# Patient Record
Sex: Female | Born: 1950
Health system: Southern US, Community
[De-identification: ages and names within clinical notes are randomized; demographics above are authoritative.]

## PROBLEM LIST (undated history)

## (undated) DIAGNOSIS — T300 Burn of unspecified body region, unspecified degree: Secondary | ICD-10-CM

## (undated) DIAGNOSIS — M1990 Unspecified osteoarthritis, unspecified site: Secondary | ICD-10-CM

## (undated) DIAGNOSIS — E785 Hyperlipidemia, unspecified: Secondary | ICD-10-CM

## (undated) DIAGNOSIS — I10 Essential (primary) hypertension: Secondary | ICD-10-CM

## (undated) DIAGNOSIS — T7840XA Allergy, unspecified, initial encounter: Secondary | ICD-10-CM

## (undated) DIAGNOSIS — Z8249 Family history of ischemic heart disease and other diseases of the circulatory system: Secondary | ICD-10-CM

## (undated) DIAGNOSIS — H269 Unspecified cataract: Secondary | ICD-10-CM

## (undated) DIAGNOSIS — C4490 Unspecified malignant neoplasm of skin, unspecified: Secondary | ICD-10-CM

## (undated) DIAGNOSIS — R011 Cardiac murmur, unspecified: Secondary | ICD-10-CM

## (undated) HISTORY — DX: Burn of unspecified body region, unspecified degree: T30.0

## (undated) HISTORY — DX: Hyperlipidemia, unspecified: E78.5

## (undated) HISTORY — DX: Unspecified cataract: H26.9

## (undated) HISTORY — DX: Family history of ischemic heart disease and other diseases of the circulatory system: Z82.49

## (undated) HISTORY — DX: Cardiac murmur, unspecified: R01.1

## (undated) HISTORY — DX: Allergy, unspecified, initial encounter: T78.40XA

## (undated) HISTORY — DX: Essential (primary) hypertension: I10

## (undated) HISTORY — PX: POLYPECTOMY: SHX149

## (undated) HISTORY — DX: Unspecified malignant neoplasm of skin, unspecified: C44.90

## (undated) HISTORY — DX: Unspecified osteoarthritis, unspecified site: M19.90

## (undated) HISTORY — PX: NASAL SINUS SURGERY: SHX719

---

## 2000-03-12 ENCOUNTER — Encounter: Admission: RE | Admit: 2000-03-12 | Discharge: 2000-03-12 | Payer: Self-pay | Admitting: Obstetrics and Gynecology

## 2000-03-12 ENCOUNTER — Encounter: Payer: Self-pay | Admitting: Obstetrics and Gynecology

## 2000-03-13 ENCOUNTER — Other Ambulatory Visit: Admission: RE | Admit: 2000-03-13 | Discharge: 2000-03-13 | Payer: Self-pay | Admitting: Obstetrics and Gynecology

## 2000-03-14 ENCOUNTER — Encounter: Payer: Self-pay | Admitting: Obstetrics and Gynecology

## 2000-03-14 ENCOUNTER — Encounter: Admission: RE | Admit: 2000-03-14 | Discharge: 2000-03-14 | Payer: Self-pay | Admitting: Obstetrics and Gynecology

## 2001-10-29 ENCOUNTER — Encounter: Payer: Self-pay | Admitting: Obstetrics and Gynecology

## 2001-10-29 ENCOUNTER — Encounter: Admission: RE | Admit: 2001-10-29 | Discharge: 2001-10-29 | Payer: Self-pay | Admitting: Obstetrics and Gynecology

## 2003-08-15 ENCOUNTER — Encounter: Admission: RE | Admit: 2003-08-15 | Discharge: 2003-08-15 | Payer: Self-pay | Admitting: Obstetrics and Gynecology

## 2004-11-27 ENCOUNTER — Encounter: Admission: RE | Admit: 2004-11-27 | Discharge: 2004-11-27 | Payer: Self-pay | Admitting: Obstetrics and Gynecology

## 2006-01-16 ENCOUNTER — Encounter: Admission: RE | Admit: 2006-01-16 | Discharge: 2006-01-16 | Payer: Self-pay | Admitting: Obstetrics and Gynecology

## 2007-05-15 ENCOUNTER — Ambulatory Visit: Payer: Self-pay | Admitting: Internal Medicine

## 2007-06-18 ENCOUNTER — Encounter: Admission: RE | Admit: 2007-06-18 | Discharge: 2007-06-18 | Payer: Self-pay | Admitting: Obstetrics and Gynecology

## 2008-04-12 ENCOUNTER — Ambulatory Visit: Payer: Self-pay | Admitting: Internal Medicine

## 2008-05-12 ENCOUNTER — Ambulatory Visit: Payer: Self-pay | Admitting: Internal Medicine

## 2008-08-10 ENCOUNTER — Ambulatory Visit: Payer: Self-pay | Admitting: Internal Medicine

## 2008-08-10 DIAGNOSIS — R5383 Other fatigue: Secondary | ICD-10-CM | POA: Insufficient documentation

## 2008-08-10 DIAGNOSIS — R0989 Other specified symptoms and signs involving the circulatory and respiratory systems: Secondary | ICD-10-CM | POA: Insufficient documentation

## 2008-08-10 DIAGNOSIS — R5381 Other malaise: Secondary | ICD-10-CM

## 2008-08-10 DIAGNOSIS — R0609 Other forms of dyspnea: Secondary | ICD-10-CM

## 2008-08-10 DIAGNOSIS — E785 Hyperlipidemia, unspecified: Secondary | ICD-10-CM | POA: Insufficient documentation

## 2008-08-16 ENCOUNTER — Ambulatory Visit: Payer: Self-pay | Admitting: Internal Medicine

## 2008-08-20 LAB — CONVERTED CEMR LAB
Albumin: 3.9 g/dL (ref 3.5–5.2)
Alkaline Phosphatase: 56 units/L (ref 39–117)
BUN: 15 mg/dL (ref 6–23)
Basophils Absolute: 0.1 10*3/uL (ref 0.0–0.1)
Calcium: 9.7 mg/dL (ref 8.4–10.5)
Eosinophils Absolute: 0.2 10*3/uL (ref 0.0–0.7)
GFR calc non Af Amer: 91.5 mL/min (ref >60)
Glucose, Bld: 99 mg/dL (ref 70–99)
HCT: 42.9 % (ref 36.0–46.0)
Lymphs Abs: 2.4 10*3/uL (ref 0.7–4.0)
Monocytes Relative: 8.6 % (ref 3.0–12.0)
Platelets: 282 10*3/uL (ref 150.0–400.0)
RDW: 12.2 % (ref 11.5–14.6)
T4, Total: 6.9 ug/dL (ref 5.0–12.5)
TSH: 1.34 microintl units/mL (ref 0.35–5.50)

## 2008-08-22 ENCOUNTER — Encounter (INDEPENDENT_AMBULATORY_CARE_PROVIDER_SITE_OTHER): Payer: Self-pay | Admitting: *Deleted

## 2008-08-24 ENCOUNTER — Encounter: Admission: RE | Admit: 2008-08-24 | Discharge: 2008-08-24 | Payer: Self-pay | Admitting: Obstetrics and Gynecology

## 2008-09-16 ENCOUNTER — Ambulatory Visit: Payer: Self-pay | Admitting: Internal Medicine

## 2008-09-16 ENCOUNTER — Telehealth (INDEPENDENT_AMBULATORY_CARE_PROVIDER_SITE_OTHER): Payer: Self-pay

## 2008-09-16 DIAGNOSIS — R740 Nonspecific elevation of levels of transaminase and lactic acid dehydrogenase [LDH]: Secondary | ICD-10-CM

## 2008-09-16 DIAGNOSIS — I10 Essential (primary) hypertension: Secondary | ICD-10-CM | POA: Insufficient documentation

## 2008-09-16 DIAGNOSIS — R7401 Elevation of levels of liver transaminase levels: Secondary | ICD-10-CM | POA: Insufficient documentation

## 2008-09-19 ENCOUNTER — Ambulatory Visit: Payer: Self-pay | Admitting: Internal Medicine

## 2008-09-21 ENCOUNTER — Telehealth (INDEPENDENT_AMBULATORY_CARE_PROVIDER_SITE_OTHER): Payer: Self-pay | Admitting: *Deleted

## 2008-09-21 ENCOUNTER — Encounter (INDEPENDENT_AMBULATORY_CARE_PROVIDER_SITE_OTHER): Payer: Self-pay | Admitting: *Deleted

## 2008-10-19 LAB — CONVERTED CEMR LAB
Alkaline Phosphatase: 83 units/L (ref 39–117)
Bilirubin, Direct: 0.1 mg/dL (ref 0.0–0.3)
Total Bilirubin: 0.7 mg/dL (ref 0.3–1.2)
Total Protein: 7.1 g/dL (ref 6.0–8.3)

## 2010-05-11 ENCOUNTER — Ambulatory Visit
Admission: RE | Admit: 2010-05-11 | Discharge: 2010-05-11 | Payer: Self-pay | Source: Home / Self Care | Attending: Internal Medicine | Admitting: Internal Medicine

## 2010-05-11 DIAGNOSIS — J019 Acute sinusitis, unspecified: Secondary | ICD-10-CM | POA: Insufficient documentation

## 2010-05-13 HISTORY — PX: COLONOSCOPY W/ POLYPECTOMY: SHX1380

## 2010-06-06 ENCOUNTER — Ambulatory Visit
Admission: RE | Admit: 2010-06-06 | Discharge: 2010-06-06 | Payer: Self-pay | Source: Home / Self Care | Attending: Internal Medicine | Admitting: Internal Medicine

## 2010-06-06 DIAGNOSIS — M546 Pain in thoracic spine: Secondary | ICD-10-CM | POA: Insufficient documentation

## 2010-06-06 DIAGNOSIS — R42 Dizziness and giddiness: Secondary | ICD-10-CM | POA: Insufficient documentation

## 2010-06-14 NOTE — Assessment & Plan Note (Signed)
Summary: SINUS CONGESTION & PRESSURE/RH......   Vital Signs:  Patient profile:   60 year old female Weight:      224.8 pounds BMI:     34.05 Temp:     98.0 degrees F oral Pulse rate:   72 / minute Resp:     15 per minute BP sitting:   134 / 90  (left arm) Cuff size:   large  Vitals Entered By: Shonna Chock CMA (May 11, 2010 1:04 PM) CC: Sinus pressure and congestion, onset Monday night/Tuesday morning. , URI symptoms   CC:  Sinus pressure and congestion, onset Monday night/Tuesday morning. , and URI symptoms.  History of Present Illness:      This is a 60 year old woman who presents with URI symptoms; onset 12/26 as sneezing & malaise.  The patient now reports nasal congestion, purulent (green) nasal discharge, and R  earache/ pressure, but denies sore throat and productive cough. The patient denies dyspnea and wheezing.  The patient denies headache.  Risk factors for Strep sinusitis include bilateral facial pain and tooth pain.  The patient denies the following risk factors for Strep sinusitis: Strep exposure and tender adenopathy. Rx: Emergen-C, Vick's, Thera Flu. She has has not had a flu shot.   Allergies (verified): No Known Drug Allergies  Physical Exam  General:  in no acute distress; alert,appropriate and cooperative throughout examination Ears:  L ear normal and R TM erythema.   Nose:  External nasal examination shows no deformity or inflammation. Nasal mucosa are  boggy without lesions or exudates.Hyponasal speech  Mouth:  Oral mucosa and oropharynx without lesions or exudates.  Teeth in good repair. Mild  pharyngeal erythema.   Lungs:  Normal respiratory effort, chest expands symmetrically. Lungs are clear to auscultation, no crackles or wheezes. Heart:  Normal rate and regular rhythm. S1 and S2 normal without gallop, murmur, click, rub or other extra sounds. Cervical Nodes:  No lymphadenopathy noted Axillary Nodes:  No palpable lymphadenopathy   Impression &  Recommendations:  Problem # 1:  SINUSITIS- ACUTE-NOS (ICD-461.9)  Her updated medication list for this problem includes:    Amoxicillin-pot Clavulanate 875-125 Mg Tabs (Amoxicillin-pot clavulanate) .Marland Kitchen... 1 every 12 hrs with a meal    Fluticasone Propionate 50 Mcg/act Susp (Fluticasone propionate) .Marland Kitchen... 1 spray two times a day as needed  Complete Medication List: 1)  Advil or Aleve  .... As needed only 2)  Amoxicillin-pot Clavulanate 875-125 Mg Tabs (Amoxicillin-pot clavulanate) .Marland Kitchen.. 1 every 12 hrs with a meal 3)  Fluticasone Propionate 50 Mcg/act Susp (Fluticasone propionate) .Marland Kitchen.. 1 spray two times a day as needed  Patient Instructions: 1)  Drink as much  NON dairy fluid as you can tolerate for the next few days. Neti pot once daily two times a day as needed until sinuses are clear. Prescriptions: FLUTICASONE PROPIONATE 50 MCG/ACT SUSP (FLUTICASONE PROPIONATE) 1 spray two times a day as needed  #1 x 5   Entered and Authorized by:   Marga Melnick MD   Signed by:   Marga Melnick MD on 05/11/2010   Method used:   Print then Give to Patient   RxID:   [id number] AMOXICILLIN-POT CLAVULANATE 875-125 MG TABS (AMOXICILLIN-POT CLAVULANATE) 1 every 12 hrs with a meal  #20 x 0   Entered and Authorized by:   Marga Melnick MD   Signed by:   Marga Melnick MD on 05/11/2010   Method used:   Print then Give to Patient   RxID:   [id number]  Orders Added: 1)  Est. Patient Level III [81859]
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
## 2010-10-01 ENCOUNTER — Other Ambulatory Visit: Payer: Self-pay | Admitting: Obstetrics

## 2010-10-01 DIAGNOSIS — Z1231 Encounter for screening mammogram for malignant neoplasm of breast: Secondary | ICD-10-CM

## 2010-10-10 ENCOUNTER — Ambulatory Visit (AMBULATORY_SURGERY_CENTER): Payer: PRIVATE HEALTH INSURANCE | Admitting: *Deleted

## 2010-10-10 VITALS — Ht 68.5 in | Wt 228.0 lb

## 2010-10-10 DIAGNOSIS — Z1211 Encounter for screening for malignant neoplasm of colon: Secondary | ICD-10-CM

## 2010-10-10 MED ORDER — PEG-KCL-NACL-NASULF-NA ASC-C 100 G PO SOLR: ORAL | Status: DC

## 2010-10-11 ENCOUNTER — Ambulatory Visit
Admission: RE | Admit: 2010-10-11 | Discharge: 2010-10-11 | Disposition: A | Discharge status: Home / Self Care | Payer: PRIVATE HEALTH INSURANCE | Source: Ambulatory Visit | Attending: Obstetrics | Admitting: Obstetrics

## 2010-10-11 DIAGNOSIS — Z1231 Encounter for screening mammogram for malignant neoplasm of breast: Secondary | ICD-10-CM

## 2010-10-24 ENCOUNTER — Encounter: Payer: Self-pay | Admitting: Internal Medicine

## 2010-10-24 ENCOUNTER — Ambulatory Visit (AMBULATORY_SURGERY_CENTER): Payer: PRIVATE HEALTH INSURANCE | Admitting: Internal Medicine

## 2010-10-24 VITALS — BP 176/91 | HR 78 | Temp 98.7°F | Resp 21 | Ht 68.5 in | Wt 225.0 lb

## 2010-10-24 DIAGNOSIS — Z1211 Encounter for screening for malignant neoplasm of colon: Secondary | ICD-10-CM

## 2010-10-24 DIAGNOSIS — D126 Benign neoplasm of colon, unspecified: Secondary | ICD-10-CM

## 2010-10-24 DIAGNOSIS — K5730 Diverticulosis of large intestine without perforation or abscess without bleeding: Secondary | ICD-10-CM

## 2010-10-24 DIAGNOSIS — Z139 Encounter for screening, unspecified: Secondary | ICD-10-CM

## 2010-10-24 MED ORDER — SODIUM CHLORIDE 0.9 % IV SOLN: 500.0000 mL | INTRAVENOUS | Status: DC

## 2010-10-24 NOTE — Patient Instructions (Signed)
DISCHARGE INSTRUCTIONS GIVEN ( SEE BLUE & GREEN SHEETS)  INFORMATION ON POLYPS ,DIVERTICULOSIS, & HIGH FIBER DIET GIVEN

## 2010-10-25 ENCOUNTER — Telehealth: Payer: Self-pay | Admitting: *Deleted

## 2010-10-25 NOTE — Telephone Encounter (Signed)

## 2010-10-29 ENCOUNTER — Encounter: Payer: Self-pay | Admitting: Internal Medicine

## 2011-01-08 ENCOUNTER — Encounter: Payer: Self-pay | Admitting: Internal Medicine

## 2011-03-19 ENCOUNTER — Ambulatory Visit (INDEPENDENT_AMBULATORY_CARE_PROVIDER_SITE_OTHER): Payer: PRIVATE HEALTH INSURANCE | Admitting: Internal Medicine

## 2011-03-19 ENCOUNTER — Encounter: Payer: Self-pay | Admitting: Internal Medicine

## 2011-03-19 VITALS — BP 138/90 | HR 78 | Temp 98.5°F | Resp 14 | Ht 69.0 in | Wt 235.2 lb

## 2011-03-19 DIAGNOSIS — IMO0002 Reserved for concepts with insufficient information to code with codable children: Secondary | ICD-10-CM

## 2011-03-19 DIAGNOSIS — Z8249 Family history of ischemic heart disease and other diseases of the circulatory system: Secondary | ICD-10-CM

## 2011-03-19 DIAGNOSIS — M179 Osteoarthritis of knee, unspecified: Secondary | ICD-10-CM

## 2011-03-19 DIAGNOSIS — I10 Essential (primary) hypertension: Secondary | ICD-10-CM

## 2011-03-19 DIAGNOSIS — M1710 Unilateral primary osteoarthritis, unspecified knee: Secondary | ICD-10-CM

## 2011-03-19 DIAGNOSIS — Z0000 Encounter for general adult medical examination without abnormal findings: Secondary | ICD-10-CM

## 2011-03-19 DIAGNOSIS — E785 Hyperlipidemia, unspecified: Secondary | ICD-10-CM

## 2011-03-19 DIAGNOSIS — T7840XA Allergy, unspecified, initial encounter: Secondary | ICD-10-CM

## 2011-03-19 DIAGNOSIS — Z91013 Allergy to seafood: Secondary | ICD-10-CM

## 2011-03-19 NOTE — Progress Notes (Signed)
Subjective:    Patient ID: Dawn [name], female    DOB: [birth year], 60 y.o.   MRN: [id number]  HPI  Dawn [name]  is here for a physical;acute issues include DJD of knees & ? Shell fish allergy.      Review of Systems  Knee pain: Onset:years ago Trigger/injury:no Pain quality:aching Pain severity:up to 7 Duration:hours Radiation: no Exacerbating factors: standing for periods Treatment/response:Aleve as needed Review of systems: Constitutional: no fever, chills, sweats, change in weight  Musculoskeletal:intermittent   muscle cramps or pain;   joint stiffness w/o redness but  swelling Skin:no rash, color change Neuro: no weakness; incontinence (stool/urine); numbness and tingling Heme:no lymphadenopathy; abnormal bruising or bleeding    She questions a shellfish allergy based on swelling of ankles after eating shrimp . Additionally she had swelling at the base of the neck without angioedema of face or tongue  after ingesting voices one occasion.   Objective:   Physical Exam Gen.: Healthy and well-nourished in appearance. Alert, appropriate and cooperative throughout exam. Head: Normocephalic without obvious abnormalities  Eyes: No corneal or conjunctival inflammation noted. Pupils equal round reactive to light and accommodation. Fundal exam is benign without hemorrhages, exudate, papilledema. Extraocular motion intact. Vision grossly normal. Ears: External  ear exam reveals no significant lesions or deformities. Canals clear .TMs normal. Hearing is grossly normal bilaterally. Nose: External nasal exam reveals no deformity or inflammation. Nasal mucosa are pink and moist. No lesions or exudates noted. Septum deviated  slightly to R  Mouth: Oral mucosa and oropharynx reveal no lesions or exudates. Teeth in good repair. Neck: No deformities, masses, or tenderness noted. Range of motion &. Thyroid normal. Lungs: Normal respiratory effort; chest expands symmetrically. Lungs are clear to  auscultation without rales, wheezes, or increased work of breathing. Heart: Normal rate and rhythm. Normal S1 and S2. No gallop, click, or rub. S4 w/o  murmur. Abdomen: Bowel sounds normal; abdomen soft and nontender. No masses, organomegaly or hernias noted. Genitalia: as per Gyn   .                                                                                   Musculoskeletal/extremities: No deformity or scoliosis noted of  the thoracic or lumbar spine. No clubbing, cyanosis, edema, or deformity noted. Range of motion decreased @ knees.Tone & strength  normal.Joints normal. Nail health  good. Vascular: Carotid, radial artery, dorsalis pedis and  posterior tibial pulses are full and equal. No bruits present. Neurologic: Alert and oriented x3. Deep tendon reflexes symmetrical and normal.          Skin: Intact without suspicious lesions or rashes. Lymph: No cervical, axillary  lymphadenopathy present. Psych: Mood and affect are normal. Normally interactive  Assessment & Plan:  #1 comprehensive physical exam; no acute findings #2 see Problem List with Assessments & Recommendations  #3 possible shellfish allergy, atypical presentation  #4 degenerative joint disease of knees  Plan: see Orders   Note: EKG reveals minor nonspecific ST-T wave changes. These are stable to improved compared to 08/10/08.

## 2011-03-19 NOTE — Patient Instructions (Signed)
Preventive Health Care: Exercise  30-45  minutes a day, 3-4 days a week. Walking is especially valuable in preventing Osteoporosis. Eat a low-fat diet with lots of fruits and vegetables, up to 7-9 servings per day. Consume less than 30 grams of sugar per day from foods & drinks with High Fructose Corn Syrup as # 1,2,3 or #4 on label. Health Care Power of Attorney & Living Will place you in charge of your health care  decisions. Verify these are  in place. Please  schedule fasting Labs : BMET,Lipids, hepatic panel, CBC & dif, TSH.  Please bring these instructions to that Lab appt.

## 2011-04-25 ENCOUNTER — Encounter: Payer: Self-pay | Admitting: Internal Medicine

## 2011-04-25 ENCOUNTER — Other Ambulatory Visit: Payer: PRIVATE HEALTH INSURANCE

## 2011-04-25 ENCOUNTER — Ambulatory Visit (INDEPENDENT_AMBULATORY_CARE_PROVIDER_SITE_OTHER): Payer: PRIVATE HEALTH INSURANCE | Admitting: Internal Medicine

## 2011-04-25 VITALS — BP 122/80 | HR 79 | Ht 68.5 in | Wt 236.2 lb

## 2011-04-25 DIAGNOSIS — L272 Dermatitis due to ingested food: Secondary | ICD-10-CM

## 2011-04-25 DIAGNOSIS — J309 Allergic rhinitis, unspecified: Secondary | ICD-10-CM

## 2011-04-25 DIAGNOSIS — Z91018 Allergy to other foods: Secondary | ICD-10-CM

## 2011-04-25 DIAGNOSIS — J302 Other seasonal allergic rhinitis: Secondary | ICD-10-CM

## 2011-04-25 DIAGNOSIS — Z91013 Allergy to seafood: Secondary | ICD-10-CM

## 2011-04-25 DIAGNOSIS — T7840XA Allergy, unspecified, initial encounter: Secondary | ICD-10-CM

## 2011-04-25 NOTE — Patient Instructions (Signed)
Order- lab-  Food Allergy profile IgE, not IgG                     Seafood Allergy Profile   Consider pretreating before potential exposure, with a nonsedating otc antihistamine like Claritin/ loratadine or Allegra/ fexofenadine  Avoid those foods that you can clearly associate with symptoms
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
## 2011-04-26 LAB — ALLERGEN FOOD PROFILE SPECIFIC IGE
Fish Cod: <0.1 kU/L (ref <0.35)
IgE (Immunoglobulin E), Serum: 8.7 IU/mL (ref 0.0–180.0)
Milk IgE: <0.1 kU/L (ref <0.35)
Peanut IgE: <0.1 kU/L (ref <0.35)
Shrimp IgE: <0.1 kU/L (ref <0.35)
Soybean IgE: <0.1 kU/L (ref <0.35)
Tuna IgE: <0.1 kU/L (ref <0.35)
Wheat IgE: <0.1 kU/L (ref <0.35)

## 2011-04-28 DIAGNOSIS — Z91013 Allergy to seafood: Secondary | ICD-10-CM | POA: Insufficient documentation

## 2011-04-28 DIAGNOSIS — J302 Other seasonal allergic rhinitis: Secondary | ICD-10-CM | POA: Insufficient documentation

## 2011-04-28 NOTE — Assessment & Plan Note (Signed)
She describes as congestion and sneezing and fall season, probably more so when she was younger. Plan-OTC antihistamine if needed.

## 2011-04-28 NOTE — Assessment & Plan Note (Signed)
The first episode she described involved only peripheral edema after prolonged sitting in hot weather. I think that was not likely an allergic event. The second episode sounds like urticaria. An IgE food allergy mechanism is characteristically repetitive upon repeat exposure, and expected to happen within 2 hours or so of eating. A none-IgE mechanism might have triggered hives a day after her meal, but as a single event the connection is unclear. I explained to her the difficulties of testing for food allergy. We will send a food allergy profile.

## 2011-05-13 ENCOUNTER — Telehealth: Payer: Self-pay | Admitting: Internal Medicine

## 2011-05-13 NOTE — Telephone Encounter (Signed)
Pt is aware that results have not come back as of yet-still being worked on through [facility]. I will call with results as soon as CY has reviewed. Pt understands and is okay.

## 2011-06-04 ENCOUNTER — Telehealth: Payer: Self-pay | Admitting: Internal Medicine

## 2011-06-04 NOTE — Telephone Encounter (Signed)
I spoke with patient about results and she verbalized understanding and had no questions 

## 2011-06-04 NOTE — Telephone Encounter (Signed)
I spoke with pt and she is requesting her allergy profile results from december. According to last phone note 05/13/11 [facility] was still working on this. Please advise Dr. Maple Hudson if you have received these results yet, thanks

## 2011-06-04 NOTE — Telephone Encounter (Signed)
My apologies- I don' know why this wasn't called at the time. Her food allergy profile showed no elevation of allergy antibodies for any foods tested. I can see her back as scheduled/ needed. She is advised to avoid foods that she finds she doesn't tolerate.

## 2011-07-31 ENCOUNTER — Telehealth: Payer: Self-pay | Admitting: Internal Medicine

## 2011-07-31 ENCOUNTER — Encounter: Payer: Self-pay | Admitting: Internal Medicine

## 2011-07-31 ENCOUNTER — Ambulatory Visit (INDEPENDENT_AMBULATORY_CARE_PROVIDER_SITE_OTHER): Payer: PRIVATE HEALTH INSURANCE | Admitting: Internal Medicine

## 2011-07-31 VITALS — BP 134/90 | HR 89 | Temp 98.5°F | Wt 233.8 lb

## 2011-07-31 DIAGNOSIS — Z1289 Encounter for screening for malignant neoplasm of other sites: Secondary | ICD-10-CM

## 2011-07-31 DIAGNOSIS — R1031 Right lower quadrant pain: Secondary | ICD-10-CM

## 2011-07-31 LAB — POCT URINALYSIS DIPSTICK
Bilirubin, UA: NEGATIVE
Blood, UA: NEGATIVE
Nitrite, UA: NEGATIVE
pH, UA: 7.5

## 2011-07-31 NOTE — Telephone Encounter (Signed)
Left message to call office

## 2011-07-31 NOTE — Telephone Encounter (Signed)
Discuss with patient  

## 2011-07-31 NOTE — Progress Notes (Signed)
  Subjective:    Patient ID: Dawn [name], female    DOB: [birth year], 61 y.o.   MRN: [id number]  HPI ABDOMINAL PAIN: Location: RLQ Onset: 10 days ago   Radiation: no  Severity: up to 8 today Quality: cramping Duration:up to 1 hr  Better with: no relievers Worse with: no factors Symptoms Nausea/Vomiting: no Diarrhea: no  Constipation: no Melena/BRBPR: no  Hematemesis: no Anorexia: no Fever/Chills: no Dysuria/ hematuria/pyuria: no but ? Traces of blood in underwear today Rash: no Wt loss: no  Vaginal bleeding:no but see above Past Surgeries: Her last colonoscopy was in July/2012. She has had polyps in the past. Followup is due in 2017.  There is no personal or family history of gastritis, ulcer, colitis, or colon cancer.       Review of Systems She denies any bloating; she had negative gynecologic exam in the summer of 2012. No PAP was done because of her age.     Objective:   Physical Exam Gen.: well-nourished in appearance. Alert, appropriate and cooperative throughout exam.  Eyes: No corneal or conjunctival inflammation noted. No icterus.  Nose: External nasal exam reveals no deformity or inflammation. Nasal mucosa are pink and moist. No lesions or exudates noted.  Mouth: Oral mucosa and oropharynx reveal no lesions or exudates. Minimal oropharyngeal erythema. Teeth in good repair. Neck: No deformities, masses, or tenderness noted.  Lungs: Normal respiratory effort; chest expands symmetrically. Lungs are clear to auscultation without rales, wheezes, or increased work of breathing. Heart: Normal rate and rhythm. Normal S1 and S2. No gallop, click, or rub. Grade 12- 1 over /6 systolic murmur . Abdomen: Bowel sounds normal; abdomen soft  But tender RLQ. No masses, organomegaly or hernias noted.  Musculoskeletal/extremities: No deformity or scoliosis noted of  the thoracic or lumbar spine. No clubbing, cyanosis, edema, or deformity noted. Nail health  good. Vascular:  Carotid, radial artery, dorsalis pedis and  posterior tibial pulses are full and equal. No bruits present. Neurologic: Alert and oriented x3.   Skin: Intact without suspicious lesions or rashes. No jaundice Lymph: No cervical, axillary lymphadenopathy present. Psych: Mood and affect are normal. Normally interactive                                                                                         Assessment & Plan:   #1 intermittent right lower quadrant pain over the last 10 days without significant gynecologic or genitourinary symptoms. Rule out low-grade appendicitis versus ovarian cyst.  Plan: See orders and recommendations

## 2011-07-31 NOTE — Patient Instructions (Signed)
Stay on clear liquids for 48-72 hours or until symptoms resolve.This would include  jello, sherbert (NOT ice cream), Lipton's chicken noodle soup(NOT cream based soups),Gatorade Lite, flat Ginger ale (without High Fructose Corn Syrup),dry toast or crackers, baked potato.No milk , dairy or grease until asymptomatic.  Report increasing pain, fever or rectal bleeding . Please complete stool cards

## 2011-07-31 NOTE — Telephone Encounter (Signed)
To ER if the pain recurs , persists without resolution or if having high fever or rectal bleeding. If none of these present keep OV today

## 2011-07-31 NOTE — Telephone Encounter (Signed)
Caller: Dawn [name]/Patient; PCP: Marga Melnick; CB#: [phone]; ; ; Call regarding R. Sided Lower Abdominal Pain X. 1. Week,; states on arising AM  07/31/11 states she was unable to stand up straight.  Pain resolved within 30 min and is currently a dull ache.  Pain has not moved during .  Denies constipation or urinary symptoms.  Afebrile.  Noted some spotting on wiping AM 07/31/11.  Per protocol, advised appt within 4 hours; appt sched first available 07/31/11 1400 with Dr. Alwyn Ren.

## 2011-08-01 LAB — CBC WITH DIFFERENTIAL/PLATELET
Basophils Absolute: 0 10*3/uL (ref 0.0–0.1)
Eosinophils Absolute: 0.1 10*3/uL (ref 0.0–0.7)
HCT: 43.9 % (ref 36.0–46.0)
Hemoglobin: 14.8 g/dL (ref 12.0–15.0)
Lymphs Abs: 3 10*3/uL (ref 0.7–4.0)
MCHC: 33.8 g/dL (ref 30.0–36.0)
MCV: 96.8 fl (ref 78.0–100.0)
Monocytes Absolute: 0.6 10*3/uL (ref 0.1–1.0)
Monocytes Relative: 11.9 % (ref 3.0–12.0)
Neutro Abs: 1.3 10*3/uL — ABNORMAL LOW (ref 1.4–7.7)
RDW: 12.9 % (ref 11.5–14.6)

## 2011-08-02 ENCOUNTER — Ambulatory Visit
Admission: RE | Admit: 2011-08-02 | Discharge: 2011-08-02 | Disposition: A | Discharge status: Home / Self Care | Payer: PRIVATE HEALTH INSURANCE | Source: Ambulatory Visit | Attending: Internal Medicine | Admitting: Internal Medicine

## 2011-08-02 DIAGNOSIS — R1031 Right lower quadrant pain: Secondary | ICD-10-CM

## 2011-08-02 MED ORDER — IOHEXOL 300 MG/ML  SOLN
100.0000 mL | Freq: Once | INTRAMUSCULAR | Status: AC | PRN
  Administered 2011-08-02: 100 mL via INTRAVENOUS

## 2011-08-02 NOTE — Progress Notes (Signed)
Addended by: Maurice Small on: 08/02/2011 10:40 AM   Modules accepted: Orders

## 2011-08-06 NOTE — Progress Notes (Signed)
Addended by: Silvio Pate D on: 08/06/2011 10:14 AM   Modules accepted: Orders

## 2012-01-07 ENCOUNTER — Other Ambulatory Visit: Payer: Self-pay | Admitting: Obstetrics

## 2012-01-07 DIAGNOSIS — Z1231 Encounter for screening mammogram for malignant neoplasm of breast: Secondary | ICD-10-CM

## 2012-01-30 ENCOUNTER — Ambulatory Visit: Payer: PRIVATE HEALTH INSURANCE

## 2012-05-26 ENCOUNTER — Ambulatory Visit (INDEPENDENT_AMBULATORY_CARE_PROVIDER_SITE_OTHER): Payer: PRIVATE HEALTH INSURANCE | Admitting: Internal Medicine

## 2012-05-26 ENCOUNTER — Encounter: Payer: Self-pay | Admitting: Internal Medicine

## 2012-05-26 VITALS — BP 122/86 | HR 87 | Temp 98.8°F | Wt 229.6 lb

## 2012-05-26 DIAGNOSIS — Z20828 Contact with and (suspected) exposure to other viral communicable diseases: Secondary | ICD-10-CM

## 2012-05-26 DIAGNOSIS — R059 Cough, unspecified: Secondary | ICD-10-CM

## 2012-05-26 DIAGNOSIS — R05 Cough: Secondary | ICD-10-CM

## 2012-05-26 LAB — POCT INFLUENZA A/B: Influenza B, POC: NEGATIVE

## 2012-05-26 MED ORDER — FLUTICASONE-SALMETEROL 250-50 MCG/DOSE IN AEPB: 1.0000 | INHALATION_SPRAY | Freq: Two times a day (BID) | RESPIRATORY_TRACT | Status: DC

## 2012-05-26 MED ORDER — OSELTAMIVIR PHOSPHATE 75 MG PO CAPS: 75.0000 mg | ORAL_CAPSULE | Freq: Two times a day (BID) | ORAL | Status: DC

## 2012-05-26 MED ORDER — PREDNISONE 20 MG PO TABS: 20.0000 mg | ORAL_TABLET | Freq: Two times a day (BID) | ORAL | Status: DC

## 2012-05-26 NOTE — Patient Instructions (Addendum)
Advair one  inhalation every 12 hours; gargle and spit after use 

## 2012-05-26 NOTE — Progress Notes (Signed)
  Subjective:    Patient ID: Dawn [name], female    DOB: [birth year], 62 y.o.   MRN: [id number]  HPI The respiratory tract symptoms began 05/25/12  as chest congestion with dry cough to point of vomitus Significant associated symptoms include R earache w/o otic discharge   Fever low grade with chills  present  1/12  Cough was associated with  shortness of breath and wheezing .       Treatment with  ColdEase, Airborne, Neti pot, NSAIDS, & Alka Seltzer was partially effective  There is no history of asthma. The patient had  quit smoking in 2013          Review of Systems Symptoms not present include frontal headache, facial pain, dental pain, sore throat,or nasal purulence. Itchy , watery eyes or significant sneezing were not noted.    Objective:   Physical Exam General appearance:good health ;well nourished; no acute distress or increased work of breathing is present.  No  lymphadenopathy about the head, neck, or axilla noted.  Eyes: No conjunctival inflammation or lid edema is present. There is no scleral icterus. Ears:  External ear exam shows no significant lesions or deformities.  Otoscopic examination reveals clear canals, tympanic membranes are intact bilaterally without bulging, retraction, inflammation or discharge. Nose:  External nasal examination shows no deformity or inflammation. Nasal mucosa are pink and moist without lesions or exudates. No septal dislocation or deviation.No obstruction to airflow.  Oral exam: Dental hygiene is good; lips and gums are healthy appearing.There is no oropharyngeal erythema or exudate noted.  Neck:  No deformities,  masses, or tenderness noted.   Supple with full range of motion without pain.  Heart:  Normal rate and regular rhythm. S1 and S2 normal without gallop, murmur, click, rub or other extra sounds.  Lungs:mild scattered  Rhonchi & rales . No rubs present.No increased work of breathing.   Extremities:  No cyanosis, edema,  or clubbing  noted  Skin: Warm & dry w/o jaundice or tenting.          Assessment & Plan:   #1 RTI , flu like syndrome with mild bronchospasm Plan: See orders and recommendations

## 2012-05-27 ENCOUNTER — Telehealth: Payer: Self-pay | Admitting: *Deleted

## 2012-05-27 MED ORDER — HYDROCODONE-HOMATROPINE 5-1.5 MG/5ML PO SYRP: ORAL_SOLUTION | ORAL | Status: DC

## 2012-05-27 NOTE — Telephone Encounter (Signed)
Pt would like to get a Rx for a cough med for her and her husband. .Please advise

## 2012-05-27 NOTE — Telephone Encounter (Signed)
Generic Hydromet Rx written 1/14; refill in her name if lost

## 2012-05-27 NOTE — Telephone Encounter (Signed)
Discuss with patient, Rx sent. 

## 2012-06-27 ENCOUNTER — Other Ambulatory Visit: Payer: Self-pay

## 2012-06-30 ENCOUNTER — Ambulatory Visit: Payer: PRIVATE HEALTH INSURANCE

## 2012-07-06 ENCOUNTER — Ambulatory Visit
Admission: RE | Admit: 2012-07-06 | Discharge: 2012-07-06 | Disposition: A | Discharge status: Home / Self Care | Payer: PRIVATE HEALTH INSURANCE | Source: Ambulatory Visit | Attending: Obstetrics | Admitting: Obstetrics

## 2012-07-06 DIAGNOSIS — Z1231 Encounter for screening mammogram for malignant neoplasm of breast: Secondary | ICD-10-CM

## 2012-08-19 ENCOUNTER — Telehealth: Payer: Self-pay | Admitting: Internal Medicine

## 2012-08-19 MED ORDER — CYCLOBENZAPRINE HCL 5 MG PO TABS: ORAL_TABLET | ORAL | Status: DC

## 2012-08-19 NOTE — Telephone Encounter (Signed)
Per Dr.Hopper Flexeril 5 mg 1-2 by mouth qhs #14, office visit if no better   Patient's husband aware rx sent in and appointment necessary if no better.

## 2012-08-19 NOTE — Telephone Encounter (Signed)
PT husband came in and wanted to know if dr hopper would write a rx for muscle spasms for her and send it to [facility] on  [address]

## 2012-08-19 NOTE — Telephone Encounter (Signed)
Dr.Hopper please advise 

## 2013-03-18 ENCOUNTER — Other Ambulatory Visit: Payer: Self-pay

## 2013-05-13 DIAGNOSIS — C4490 Unspecified malignant neoplasm of skin, unspecified: Secondary | ICD-10-CM

## 2013-05-13 HISTORY — DX: Unspecified malignant neoplasm of skin, unspecified: C44.90

## 2013-10-25 ENCOUNTER — Other Ambulatory Visit: Payer: Self-pay

## 2013-10-25 DIAGNOSIS — Z1231 Encounter for screening mammogram for malignant neoplasm of breast: Secondary | ICD-10-CM

## 2013-11-02 ENCOUNTER — Ambulatory Visit
Admission: RE | Admit: 2013-11-02 | Discharge: 2013-11-02 | Disposition: A | Discharge status: Home / Self Care | Payer: PRIVATE HEALTH INSURANCE | Source: Ambulatory Visit

## 2013-11-02 DIAGNOSIS — Z1231 Encounter for screening mammogram for malignant neoplasm of breast: Secondary | ICD-10-CM

## 2014-12-05 ENCOUNTER — Encounter: Payer: Self-pay | Admitting: Internal Medicine

## 2014-12-05 ENCOUNTER — Ambulatory Visit (INDEPENDENT_AMBULATORY_CARE_PROVIDER_SITE_OTHER): Payer: PRIVATE HEALTH INSURANCE | Admitting: Internal Medicine

## 2014-12-05 VITALS — BP 142/84 | HR 80 | Temp 97.8°F | Resp 16 | Ht 68.5 in | Wt 236.0 lb

## 2014-12-05 DIAGNOSIS — Z8489 Family history of other specified conditions: Secondary | ICD-10-CM | POA: Diagnosis not present

## 2014-12-05 DIAGNOSIS — C4490 Unspecified malignant neoplasm of skin, unspecified: Secondary | ICD-10-CM | POA: Insufficient documentation

## 2014-12-05 DIAGNOSIS — Z0000 Encounter for general adult medical examination without abnormal findings: Secondary | ICD-10-CM

## 2014-12-05 DIAGNOSIS — Z87898 Personal history of other specified conditions: Secondary | ICD-10-CM

## 2014-12-05 NOTE — Progress Notes (Signed)
Pre visit review using our clinic review tool, if applicable. No additional management support is needed unless otherwise documented below in the visit note. 

## 2014-12-05 NOTE — Patient Instructions (Addendum)
Sleep apnea may present as a disturbed sleep pattern, significant fatigue, heart rhythm disturbance,or edema ( swelling of the extremities). Please let me know if I may refer you for sleep apnea evaluation if snoring is significant.   Your next office appointment will be determined based upon review of your pending labs .  Those written interpretation of the lab results and instructions will be transmitted to you by My Chart    Critical results will be called.   Followup as needed for any active or acute issue. Please report any significant change in your symptoms.  Minimal Blood Pressure Goal= AVERAGE < 140/90;  Ideal is an AVERAGE < 135/85. This AVERAGE should be calculated from @ least 5-7 BP readings taken @ different times of day on different days of week. You should not respond to isolated BP readings , but rather the AVERAGE for that week .Please bring your  blood pressure cuff to office visits to verify that it is reliable.It  can also be checked against the blood pressure device at the pharmacy. Finger or wrist cuffs are not dependable; an arm cuff is.  Colonoscopy due 2017

## 2014-12-05 NOTE — Progress Notes (Signed)
   Subjective:    Patient ID: Dawn [name], female    DOB: 06-Mar-1951, 64 y.o.   MRN: [id number]  HPI She is here for a physical;acute issues denied.  She eats red meat occasionally but is essentially on a heart healthy diet. She has decreased salt intake. She is not monitoring her blood pressure except at the gym. It was 142/92 at the last reading. She uses a stationary bike 2 times a week for 20 minutes without cardiopulmonary symptoms.  Colonoscopy is due 2017 ;she denies any active GI symptoms.  Review of Systems  She does have some heat intolerance. She also has nocturia 1 on average. Her husband states she snores but does not mention any apnea.  Chest pain, palpitations, tachycardia, exertional dyspnea, paroxysmal nocturnal dyspnea, claudication or edema are absent. No unexplained weight loss, abdominal pain, significant dyspepsia, dysphagia, melena, rectal bleeding, or persistently small caliber stools. Dysuria, pyuria, hematuria, frequency, or polyuria are denied. Change in hair, skin, nails denied. No bowel changes of constipation or diarrhea. No intolerance to cold.      Objective:   Physical Exam  Pertinent or positive findings include: The oropharynx is crowded with poor visualization. She has slight crepitus of the knees. General appearance :adequately nourished; in no distress.BMI 35.36.  Eyes: No conjunctival inflammation or scleral icterus is present.  Oral exam:  Lips and gums are healthy appearing.There is no oropharyngeal erythema or exudate noted. Dental hygiene is good.  Heart:  Normal rate and regular rhythm. S1 and S2 normal without gallop, murmur, click, rub or other extra sounds    Lungs:Chest clear to auscultation; no wheezes, rhonchi,rales ,or rubs present.No increased work of breathing.   Abdomen: bowel sounds normal, soft and non-tender without masses, organomegaly or hernias noted.  No guarding or rebound.   Vascular : all pulses equal ; no  bruits present.  Skin:Warm & dry.  Intact without suspicious lesions or rashes ; no tenting or jaundice   Lymphatic: No lymphadenopathy is noted about the head, neck, axilla   Neuro: Strength, tone & DTRs normal.        Assessment & Plan:  #1 comprehensive physical exam; no acute findings  #2 snoring with decreased diameter of the oropharynx. She's been asked to discuss the degree of snoring her husband has noted. Sleep apnea evaluation should be considered if significant.  Plan: see Orders  & Recommendations

## 2015-02-09 ENCOUNTER — Other Ambulatory Visit: Payer: Self-pay

## 2015-02-09 DIAGNOSIS — Z1231 Encounter for screening mammogram for malignant neoplasm of breast: Secondary | ICD-10-CM

## 2015-03-01 ENCOUNTER — Ambulatory Visit
Admission: RE | Admit: 2015-03-01 | Discharge: 2015-03-01 | Disposition: A | Discharge status: Home / Self Care | Payer: PRIVATE HEALTH INSURANCE | Source: Ambulatory Visit

## 2015-03-01 ENCOUNTER — Ambulatory Visit: Payer: PRIVATE HEALTH INSURANCE

## 2015-03-01 DIAGNOSIS — Z1231 Encounter for screening mammogram for malignant neoplasm of breast: Secondary | ICD-10-CM

## 2015-09-18 ENCOUNTER — Encounter: Payer: Self-pay | Admitting: Gastroenterology

## 2016-02-26 DIAGNOSIS — M170 Bilateral primary osteoarthritis of knee: Secondary | ICD-10-CM | POA: Diagnosis not present

## 2016-02-26 DIAGNOSIS — M25561 Pain in right knee: Secondary | ICD-10-CM | POA: Diagnosis not present

## 2016-03-26 ENCOUNTER — Ambulatory Visit: Payer: PRIVATE HEALTH INSURANCE | Admitting: Nurse Practitioner

## 2016-04-22 ENCOUNTER — Ambulatory Visit (INDEPENDENT_AMBULATORY_CARE_PROVIDER_SITE_OTHER): Payer: Medicare Other | Admitting: Nurse Practitioner

## 2016-04-22 ENCOUNTER — Other Ambulatory Visit: Payer: Self-pay | Admitting: Family

## 2016-04-22 ENCOUNTER — Encounter: Payer: Self-pay | Admitting: Nurse Practitioner

## 2016-04-22 VITALS — BP 138/84 | HR 101 | Temp 98.1°F | Ht 68.5 in | Wt 245.0 lb

## 2016-04-22 DIAGNOSIS — Z23 Encounter for immunization: Secondary | ICD-10-CM | POA: Diagnosis not present

## 2016-04-22 DIAGNOSIS — T2125XA Burn of second degree of buttock, initial encounter: Secondary | ICD-10-CM | POA: Diagnosis not present

## 2016-04-22 DIAGNOSIS — L03317 Cellulitis of buttock: Secondary | ICD-10-CM | POA: Diagnosis not present

## 2016-04-22 DIAGNOSIS — Z1211 Encounter for screening for malignant neoplasm of colon: Secondary | ICD-10-CM

## 2016-04-22 MED ORDER — CLINDAMYCIN HCL 300 MG PO CAPS: 300.0000 mg | ORAL_CAPSULE | Freq: Three times a day (TID) | ORAL | 0 refills | Status: DC

## 2016-04-22 NOTE — Patient Instructions (Addendum)
Applied extra virgin olive oil or coconut oil twice a day to scab.  Keep wound clean.  Debride wound scab if no improvement with oral abx.

## 2016-04-22 NOTE — Progress Notes (Signed)
Subjective:  Patient ID: Dawn [name], female    DOB: August 26, 1950  Age: 65 y.o. MRN: [id number]  CC: Establish Care (establish care/transfer from [facility]/place on back (burn herself with heating pad last week,not healing.flu shot? )  Wound Check  She was originally treated 5 to 10 days ago (right buttocks after sleeping on heating pad). Previous treatment included wound cleansing or irrigation (and neosporin). Her temperature was unmeasured prior to arrival. There has been no drainage from the wound. The redness has not changed. There is no swelling present. There is no pain present. She has no difficulty moving the affected extremity or digit.    Outpatient Medications Prior to Visit  Medication Sig Dispense Refill  . Biotin 300 MCG TABS Take by mouth daily.    . Multiple Vitamins-Minerals (MULTIVITAMIN PO) Take by mouth daily.    . Naproxen Sodium (ALEVE PO) Take by mouth as needed.     No facility-administered medications prior to visit.     ROS See HPI  Objective:  BP 138/84   Pulse (!) 101   Temp 98.1 F (36.7 C)   Ht 5' 8.5" (1.74 m)   Wt 245 lb (111.1 kg)   SpO2 98%   BMI 36.71 kg/m   BP Readings from Last 3 Encounters:  04/22/16 138/84  12/05/14 (!) 142/84  05/26/12 122/86    Wt Readings from Last 3 Encounters:  04/22/16 245 lb (111.1 kg)  12/05/14 236 lb (107 kg)  05/26/12 229 lb 9.6 oz (104.1 kg)    Physical Exam  Constitutional: She is oriented to person, place, and time. No distress.  Cardiovascular: Normal rate and regular rhythm.   Pulmonary/Chest: Effort normal and breath sounds normal.  Musculoskeletal: She exhibits edema.  Lymphadenopathy:    She has no cervical adenopathy.  Neurological: She is alert and oriented to person, place, and time.  Skin: Skin is warm and dry. There is erythema.     Vitals reviewed.   Lab Results  Component Value Date   WBC 5.0 07/31/2011   HGB 14.8 07/31/2011   HCT 43.9 07/31/2011   PLT 277.0 07/31/2011     GLUCOSE 99 08/16/2008   ALT 69 (H) 09/19/2008   AST 46 (H) 09/19/2008   NA 144 08/16/2008   K 4.3 08/16/2008   CL 107 08/16/2008   CREATININE 0.7 08/16/2008   BUN 15 08/16/2008   CO2 29 08/16/2008   TSH 1.34 08/16/2008    Mm Screening Breast Tomo Bilateral  Result Date: 03/02/2015 CLINICAL DATA:  Screening. EXAM: DIGITAL SCREENING BILATERAL MAMMOGRAM WITH 3D TOMO WITH CAD COMPARISON:  Previous exam(s). ACR Breast Density Category b: There are scattered areas of fibroglandular density. FINDINGS: There are no findings suspicious for malignancy. Images were processed with CAD. IMPRESSION: No mammographic evidence of malignancy. A result letter of this screening mammogram will be mailed directly to the patient. RECOMMENDATION: Screening mammogram in one year. (Code:SM-B-01Y) BI-RADS CATEGORY  1: Negative. Electronically Signed   By: Abelardo Diesel M.D.   On: 03/02/2015 08:04    Assessment & Plan:   Dawn [name] was seen today for establish care.  Diagnoses and all orders for this visit:  Second degree burn of buttock, initial encounter -     clindamycin (CLEOCIN) 300 MG capsule; Take 1 capsule (300 mg total) by mouth 3 (three) times daily.  Cellulitis of buttock -     clindamycin (CLEOCIN) 300 MG capsule; Take 1 capsule (300 mg total) by mouth 3 (three) times daily.  Encounter for immunization -     Flu vaccine HIGH DOSE PF   I am having Dawn [name] start on clindamycin. I am also having her maintain her Multiple Vitamins-Minerals (MULTIVITAMIN PO), Biotin, Naproxen Sodium (ALEVE PO), and meloxicam.  Meds ordered this encounter  Medications  . meloxicam (MOBIC) 15 MG tablet  . clindamycin (CLEOCIN) 300 MG capsule    Sig: Take 1 capsule (300 mg total) by mouth 3 (three) times daily.    Dispense:  21 capsule    Refill:  0    Order Specific Question:   Supervising Provider    Answer:   Cassandria Anger [[id number]]    Follow-up: Return in about 3 weeks (around 05/10/2016) for CPE  and re veal of R. buttock wound.Wilfred Lacy, NP

## 2016-04-22 NOTE — Progress Notes (Signed)
Pre visit review using our clinic review tool, if applicable. No additional management support is needed unless otherwise documented below in the visit note. 

## 2016-05-09 ENCOUNTER — Ambulatory Visit (INDEPENDENT_AMBULATORY_CARE_PROVIDER_SITE_OTHER): Payer: Medicare Other | Admitting: Nurse Practitioner

## 2016-05-09 ENCOUNTER — Other Ambulatory Visit (INDEPENDENT_AMBULATORY_CARE_PROVIDER_SITE_OTHER): Payer: Medicare Other

## 2016-05-09 ENCOUNTER — Encounter: Payer: Self-pay | Admitting: Gastroenterology

## 2016-05-09 ENCOUNTER — Other Ambulatory Visit: Payer: Self-pay | Admitting: Obstetrics

## 2016-05-09 ENCOUNTER — Encounter: Payer: Self-pay | Admitting: Nurse Practitioner

## 2016-05-09 VITALS — BP 140/84 | HR 77 | Temp 97.5°F | Ht 68.0 in | Wt 244.0 lb

## 2016-05-09 DIAGNOSIS — T2125XA Burn of second degree of buttock, initial encounter: Secondary | ICD-10-CM

## 2016-05-09 DIAGNOSIS — E782 Mixed hyperlipidemia: Secondary | ICD-10-CM | POA: Diagnosis not present

## 2016-05-09 DIAGNOSIS — Z0000 Encounter for general adult medical examination without abnormal findings: Secondary | ICD-10-CM

## 2016-05-09 DIAGNOSIS — Z13220 Encounter for screening for lipoid disorders: Secondary | ICD-10-CM

## 2016-05-09 DIAGNOSIS — I10 Essential (primary) hypertension: Secondary | ICD-10-CM

## 2016-05-09 DIAGNOSIS — Z0001 Encounter for general adult medical examination with abnormal findings: Secondary | ICD-10-CM

## 2016-05-09 DIAGNOSIS — Z136 Encounter for screening for cardiovascular disorders: Secondary | ICD-10-CM

## 2016-05-09 DIAGNOSIS — Z1231 Encounter for screening mammogram for malignant neoplasm of breast: Secondary | ICD-10-CM

## 2016-05-09 DIAGNOSIS — Z780 Asymptomatic menopausal state: Secondary | ICD-10-CM | POA: Diagnosis not present

## 2016-05-09 LAB — COMPREHENSIVE METABOLIC PANEL
ALT: 64 U/L — ABNORMAL HIGH (ref 0–35)
AST: 40 U/L — ABNORMAL HIGH (ref 0–37)
Albumin: 4.2 g/dL (ref 3.5–5.2)
Alkaline Phosphatase: 71 U/L (ref 39–117)
BUN: 19 mg/dL (ref 6–23)
CALCIUM: 9.3 mg/dL (ref 8.4–10.5)
CHLORIDE: 109 meq/L (ref 96–112)
CO2: 26 meq/L (ref 19–32)
Creatinine, Ser: 0.72 mg/dL (ref 0.40–1.20)
GFR: 86.34 mL/min (ref >60.00)
Glucose, Bld: 99 mg/dL (ref 70–99)
Potassium: 4.4 mEq/L (ref 3.5–5.1)
Sodium: 143 mEq/L (ref 135–145)
Total Bilirubin: 0.4 mg/dL (ref 0.2–1.2)
Total Protein: 6.9 g/dL (ref 6.0–8.3)

## 2016-05-09 LAB — LIPID PANEL
CHOL/HDL RATIO: 3
Cholesterol: 206 mg/dL — ABNORMAL HIGH (ref 0–200)
HDL: 59.2 mg/dL (ref >39.00)
LDL CALC: 120 mg/dL — AB (ref 0–99)
NonHDL: 147.12
TRIGLYCERIDES: 135 mg/dL (ref 0.0–149.0)
VLDL: 27 mg/dL (ref 0.0–40.0)

## 2016-05-09 NOTE — Progress Notes (Signed)
Subjective:    Patient ID: Dawn [name], female    DOB: 05-Dec-1950, 65 y.o.   MRN: [id number]  Patient presents today for complete physical or establish care (new patient) and wound re eval.  HPI  HTN: BP Readings from Last 3 Encounters:  05/09/16 140/84  04/22/16 138/84  12/05/14 (!) 142/84   Immunizations: (TDAP, Hep C screen, Pneumovax, Influenza, zoster)  Health Maintenance  Topic Date Due  . Colon Cancer Screening  10/24/2015  . DEXA scan (bone density measurement)  02/11/2016  . Pap Smear  04/14/2017*  . Shingles Vaccine  04/14/2017*  .  Hepatitis C: One time screening is recommended by Center for Disease Control  (CDC) for  adults born from [birth year] through 1965.   04/14/2017*  . HIV Screening  04/14/2017*  . Pneumonia vaccines (1 of 2 - PCV13) 04/14/2017*  . Mammogram  02/28/2017  . Tetanus Vaccine  03/13/2022  . Flu Shot  Completed  *Topic was postponed. The date shown is not the original due date.   Diet:regular Weight:  Wt Readings from Last 3 Encounters:  05/09/16 244 lb (110.7 kg)  04/22/16 245 lb (111.1 kg)  12/05/14 236 lb (107 kg)   Exercise:none Fall Risk: Fall Risk  04/22/2016  Falls in the past year? No   Home Safety:home with husband Depression/Suicide: Depression screen [facility] 2/9 04/22/2016  Decreased Interest 0  Down, Depressed, Hopeless 0  PHQ - 2 Score 0   No flowsheet data found. Colonoscopy (every 5-37yr, >50-766yr:needed, patient will scheduled Dexa (every 2-5y51yr>65y70yreeded Pap Smear (every 44yrs7yr >21-29 without HPV, every 108yrs 23yr>30-6108yrs 36yrHPV):declined Mammogram (yearly, >4108yrs):49yr date Vision:up to date Dental:up to date Sexual History (birth control, marital status, STD):married, sexually active, postmenopausal  Medications and allergies reviewed with patient and updated if appropriate.  Patient Active Problem List   Diagnosis Date Noted  . Mixed hyperlipidemia 05/10/2016  . Skin cancer 12/05/2014  .  Allergy to seafood 04/28/2011  . Allergic rhinitis, seasonal 04/28/2011  . Essential hypertension 09/16/2008  . SNORING 08/10/2008    Current Outpatient Prescriptions on File Prior to Visit  Medication Sig Dispense Refill  . Biotin 300 MCG TABS Take by mouth daily.    . meloxicam (MOBIC) 15 MG tablet     . Multiple Vitamins-Minerals (MULTIVITAMIN PO) Take by mouth daily.    . Naproxen Sodium (ALEVE PO) Take by mouth as needed.     No current facility-administered medications on file prior to visit.     Past Medical History:  Diagnosis Date  . Allergy    Shell Fish  . Arthritis    knees  . Family history of ischemic heart disease   . Hyperlipidemia     Past Surgical History:  Procedure Laterality Date  . CESAREAN SECTION      G 2 P 2  . COLONOSCOPY W/ POLYPECTOMY  2012   5 polyps; ? adenomatous. F/U due 2017. Dr Brodie  Olevia PerchesL SINUS SURGERY      Social History   Social History  . Marital status: Married    Spouse name: N/A  . Number of children: N/A  . Years of education: N/A   Social History Main Topics  . Smoking status: Former Smoker    Packs/day: 0.05  . Smokeless tobacco: Never Used     Comment: She smoked less than 4 cigarettes a day intermittently from college until 2013  . Alcohol use 8.4 oz/week    14 Glasses of wine  per week     Comment:  socially  . Drug use: No  . Sexual activity: Not Asked   Other Topics Concern  . None   Social History Narrative  . None    Family History  Problem Relation Age of Onset  . Lung cancer Father   . Cancer Mother     NHL  . Heart attack Maternal Grandfather 62  . Heart attack Maternal Uncle 62  . Diabetes Maternal Grandmother         Review of Systems  Constitutional: Negative for fever, malaise/fatigue and weight loss.  HENT: Negative for congestion and sore throat.   Eyes:       Negative for visual changes  Respiratory: Negative for cough and shortness of breath.   Cardiovascular: Negative for  chest pain, palpitations and leg swelling.  Gastrointestinal: Positive for diarrhea. Negative for abdominal pain, blood in stool, constipation, heartburn and nausea.       Loose stool since use of oral abx (clindamycin). Has 1-2 loose stools per day. No blood, no fever, no ABD pain.  Genitourinary: Negative for dysuria, frequency and urgency.  Musculoskeletal: Negative for falls, joint pain and myalgias.  Skin: Negative for rash.       Persistent left buttock scalp since thermal burn. No pain, no drainage, no fever. Has completed course or clindamycin. Has also use olive oil  BID to soften scalp but no improvement.  Neurological: Negative for dizziness, sensory change and headaches.  Endo/Heme/Allergies: Does not bruise/bleed easily.  Psychiatric/Behavioral: Negative for depression, substance abuse and suicidal ideas. The patient is not nervous/anxious.     Objective:   Vitals:   05/09/16 0814  BP: 140/84  Pulse: 77  Temp: 97.5 F (36.4 C)    Body mass index is 37.1 kg/m.   Physical Examination:  Physical Exam  Constitutional: She is oriented to person, place, and time and well-developed, well-nourished, and in no distress. No distress.  HENT:  Right Ear: External ear normal.  Left Ear: External ear normal.  Nose: Nose normal.  Mouth/Throat: Oropharynx is clear and moist. No oropharyngeal exudate.  Eyes: Conjunctivae and EOM are normal. Pupils are equal, round, and reactive to light. No scleral icterus.  Neck: Normal range of motion. Neck supple. No thyromegaly present.  Cardiovascular: Normal rate, normal heart sounds and intact distal pulses.   Pulmonary/Chest: Effort normal and breath sounds normal. She exhibits no tenderness.  Declined breast exam  Abdominal: Soft. Bowel sounds are normal. She exhibits no distension. There is no tenderness.  Genitourinary:  Genitourinary Comments: declined  Musculoskeletal: Normal range of motion. She exhibits no edema or tenderness.    Lymphadenopathy:    She has no cervical adenopathy.  Neurological: She is alert and oriented to person, place, and time. Gait normal.  Skin: Skin is warm and dry. Burn and lesion noted.     Psychiatric: Affect and judgment normal.  Vitals reviewed.    This SmartLink has not been configured with any valid records.   BP Readings from Last 3 Encounters:  05/09/16 140/84  04/22/16 138/84  12/05/14 (!) 142/84   ASSESSMENT and PLAN:  Morissa was seen today for annual exam.  Diagnoses and all orders for this visit:  Encounter for preventative adult health care exam with abnormal findings -     DG Bone Density; Future -     Comp Met (CMET); Future -     Lipid panel; Future  Postmenopausal state -     DG Bone  Density; Future  Mixed hyperlipidemia -     Lipid panel; Future  Second degree burn of buttock, initial encounter -     AMB referral to wound care center  Essential hypertension   Essential hypertension No medication. Mild elevation. Will continue to monitor  Mixed hyperlipidemia Not at goal. Lipid Panel     Component Value Date/Time   CHOL 206 (H) 05/09/2016 0907   TRIG 135.0 05/09/2016 0907   HDL 59.20 05/09/2016 0907   CHOLHDL 3 05/09/2016 0907   VLDL 27.0 05/09/2016 0907   LDLCALC 120 (H) 05/09/2016 0907     Recent Results (from the past 2160 hour(s))  Comp Met (CMET)     Status: Abnormal   Collection Time: 05/09/16  9:07 AM  Result Value Ref Range   Sodium 143 135 - 145 mEq/L   Potassium 4.4 3.5 - 5.1 mEq/L   Chloride 109 96 - 112 mEq/L   CO2 26 19 - 32 mEq/L   Glucose, Bld 99 70 - 99 mg/dL   BUN 19 6 - 23 mg/dL   Creatinine, Ser 0.72 0.40 - 1.20 mg/dL   Total Bilirubin 0.4 0.2 - 1.2 mg/dL   Alkaline Phosphatase 71 39 - 117 U/L   AST 40 (H) 0 - 37 U/L   ALT 64 (H) 0 - 35 U/L   Total Protein 6.9 6.0 - 8.3 g/dL   Albumin 4.2 3.5 - 5.2 g/dL   Calcium 9.3 8.4 - 10.5 mg/dL   GFR 86.34 >60.00 mL/min  Lipid panel     Status: Abnormal    Collection Time: 05/09/16  9:07 AM  Result Value Ref Range   Cholesterol 206 (H) 0 - 200 mg/dL    Comment: ATP III Classification       Desirable:  < 200 mg/dL               Borderline High:  200 - 239 mg/dL          High:  > = 240 mg/dL   Triglycerides 135.0 0.0 - 149.0 mg/dL    Comment: Normal:  <150 mg/dLBorderline High:  150 - 199 mg/dL   HDL 59.20 >39.00 mg/dL   VLDL 27.0 0.0 - 40.0 mg/dL   LDL Cholesterol 120 (H) 0 - 99 mg/dL   Total CHOL/HDL Ratio 3     Comment:                Men          Women1/2 Average Risk     3.4          3.3Average Risk          5.0          4.42X Average Risk          9.6          7.13X Average Risk          15.0          11.0                       NonHDL 147.12     Comment: NOTE:  Non-HDL goal should be 30 mg/dL higher than patient's LDL goal (i.e. LDL goal of < 70 mg/dL, would have non-HDL goal of < 100 mg/dL)   Follow up: Return in about 3 months (around 08/07/2016) for wound re eval.  Wilfred Lacy, NP

## 2016-05-09 NOTE — Progress Notes (Signed)
Pre visit review using our clinic review tool, if applicable. No additional management support is needed unless otherwise documented below in the visit note. 

## 2016-05-09 NOTE — Patient Instructions (Addendum)
[facility]. (close to [facility]) [place], [place],  [address]  Open today  9AM-4:30PM  Phone: [phone]  Use Florastor or curturelle probiotic once or twice a day x week. Contact office if no improvement of diarrhea in week. We will need to check stool for C. Diff and stool culture.   Health Maintenance, Female Introduction Adopting a healthy lifestyle and getting preventive care can go a long way to promote health and wellness. Talk with your health care provider about what schedule of regular examinations is right for you. This is a good chance for you to check in with your provider about disease prevention and staying healthy. In between checkups, there are plenty of things you can do on your own. Experts have done a lot of research about which lifestyle changes and preventive measures are most likely to keep you healthy. Ask your health care provider for more information. Weight and diet Eat a healthy diet  Be sure to include plenty of vegetables, fruits, low-fat dairy products, and lean protein.  Do not eat a lot of foods high in solid fats, added sugars, or salt.  Get regular exercise. This is one of the most important things you can do for your health.  Most adults should exercise for at least 150 minutes each week. The exercise should increase your heart rate and make you sweat (moderate-intensity exercise).  Most adults should also do strengthening exercises at least twice a week. This is in addition to the moderate-intensity exercise. Maintain a healthy weight  Body mass index (BMI) is a measurement that can be used to identify possible weight problems. It estimates body fat based on height and weight. Your health care provider can help determine your BMI and help you achieve or maintain a healthy weight.  For females 45 years of age and older:  A BMI below 18.5 is considered underweight.  A BMI of 18.5 to 24.9 is  normal.  A BMI of 25 to 29.9 is considered overweight.  A BMI of 30 and above is considered obese. Watch levels of cholesterol and blood lipids  You should start having your blood tested for lipids and cholesterol at 65 years of age, then have this test every 5 years.  You may need to have your cholesterol levels checked more often if:  Your lipid or cholesterol levels are high.  You are older than 65 years of age.  You are at high risk for heart disease. Cancer screening Lung Cancer  Lung cancer screening is recommended for adults 60-67 years old who are at high risk for lung cancer because of a history of smoking.  A yearly low-dose CT scan of the lungs is recommended for people who:  Currently smoke.  Have quit within the past 15 years.  Have at least a 30-pack-year history of smoking. A pack year is smoking an average of one pack of cigarettes a day for 1 year.  Yearly screening should continue until it has been 15 years since you quit.  Yearly screening should stop if you develop a health problem that would prevent you from having lung cancer treatment. Breast Cancer  Practice breast self-awareness. This means understanding how your breasts normally appear and feel.  It also means doing regular breast self-exams. Let your health care provider know about any changes, no matter how small.  If you are in your 20s or 30s, you should have a clinical breast exam (CBE) by a health care provider  every 1-3 years as part of a regular health exam.  If you are 60 or older, have a CBE every year. Also consider having a breast X-ray (mammogram) every year.  If you have a family history of breast cancer, talk to your health care provider about genetic screening.  If you are at high risk for breast cancer, talk to your health care provider about having an MRI and a mammogram every year.  Breast cancer gene (BRCA) assessment is recommended for women who have family members with  BRCA-related cancers. BRCA-related cancers include:  Breast.  Ovarian.  Tubal.  Peritoneal cancers.  Results of the assessment will determine the need for genetic counseling and BRCA1 and BRCA2 testing. Cervical Cancer  Your health care provider may recommend that you be screened regularly for cancer of the pelvic organs (ovaries, uterus, and vagina). This screening involves a pelvic examination, including checking for microscopic changes to the surface of your cervix (Pap test). You may be encouraged to have this screening done every 3 years, beginning at age 1.  For women ages 81-65, health care providers may recommend pelvic exams and Pap testing every 3 years, or they may recommend the Pap and pelvic exam, combined with testing for human papilloma virus (HPV), every 5 years. Some types of HPV increase your risk of cervical cancer. Testing for HPV may also be done on women of any age with unclear Pap test results.  Other health care providers may not recommend any screening for nonpregnant women who are considered low risk for pelvic cancer and who do not have symptoms. Ask your health care provider if a screening pelvic exam is right for you.  If you have had past treatment for cervical cancer or a condition that could lead to cancer, you need Pap tests and screening for cancer for at least 20 years after your treatment. If Pap tests have been discontinued, your risk factors (such as having a new sexual partner) need to be reassessed to determine if screening should resume. Some women have medical problems that increase the chance of getting cervical cancer. In these cases, your health care provider may recommend more frequent screening and Pap tests. Colorectal Cancer  This type of cancer can be detected and often prevented.  Routine colorectal cancer screening usually begins at 65 years of age and continues through 65 years of age.  Your health care provider may recommend screening at  an earlier age if you have risk factors for colon cancer.  Your health care provider may also recommend using home test kits to check for hidden blood in the stool.  A small camera at the end of a tube can be used to examine your colon directly (sigmoidoscopy or colonoscopy). This is done to check for the earliest forms of colorectal cancer.  Routine screening usually begins at age 52.  Direct examination of the colon should be repeated every 5-10 years through 65 years of age. However, you may need to be screened more often if early forms of precancerous polyps or small growths are found. Skin Cancer  Check your skin from head to toe regularly.  Tell your health care provider about any new moles or changes in moles, especially if there is a change in a mole's shape or color.  Also tell your health care provider if you have a mole that is larger than the size of a pencil eraser.  Always use sunscreen. Apply sunscreen liberally and repeatedly throughout the day.  Protect yourself  by wearing long sleeves, pants, a wide-brimmed hat, and sunglasses whenever you are outside. Heart disease, diabetes, and high blood pressure  High blood pressure causes heart disease and increases the risk of stroke. High blood pressure is more likely to develop in:  People who have blood pressure in the high end of the normal range (130-139/85-89 mm Hg).  People who are overweight or obese.  People who are African American.  If you are 37-64 years of age, have your blood pressure checked every 3-5 years. If you are 26 years of age or older, have your blood pressure checked every year. You should have your blood pressure measured twice-once when you are at a hospital or clinic, and once when you are not at a hospital or clinic. Record the average of the two measurements. To check your blood pressure when you are not at a hospital or clinic, you can use:  An automated blood pressure machine at a pharmacy.  A  home blood pressure monitor.  If you are between 76 years and 28 years old, ask your health care provider if you should take aspirin to prevent strokes.  Have regular diabetes screenings. This involves taking a blood sample to check your fasting blood sugar level.  If you are at a normal weight and have a low risk for diabetes, have this test once every three years after 65 years of age.  If you are overweight and have a high risk for diabetes, consider being tested at a younger age or more often. Preventing infection Hepatitis B  If you have a higher risk for hepatitis B, you should be screened for this virus. You are considered at high risk for hepatitis B if:  You were born in a country where hepatitis B is common. Ask your health care provider which countries are considered high risk.  Your parents were born in a high-risk country, and you have not been immunized against hepatitis B (hepatitis B vaccine).  You have HIV or AIDS.  You use needles to inject street drugs.  You live with someone who has hepatitis B.  You have had sex with someone who has hepatitis B.  You get hemodialysis treatment.  You take certain medicines for conditions, including cancer, organ transplantation, and autoimmune conditions. Hepatitis C  Blood testing is recommended for:  Everyone born from [birth year] through 1965.  Anyone with known risk factors for hepatitis C. Sexually transmitted infections (STIs)  You should be screened for sexually transmitted infections (STIs) including gonorrhea and chlamydia if:  You are sexually active and are younger than 65 years of age.  You are older than 65 years of age and your health care provider tells you that you are at risk for this type of infection.  Your sexual activity has changed since you were last screened and you are at an increased risk for chlamydia or gonorrhea. Ask your health care provider if you are at risk.  If you do not have HIV, but are  at risk, it may be recommended that you take a prescription medicine daily to prevent HIV infection. This is called pre-exposure prophylaxis (PrEP). You are considered at risk if:  You are sexually active and do not regularly use condoms or know the HIV status of your partner(s).  You take drugs by injection.  You are sexually active with a partner who has HIV. Talk with your health care provider about whether you are at high risk of being infected with HIV. If you  choose to begin PrEP, you should first be tested for HIV. You should then be tested every 3 months for as long as you are taking PrEP. Pregnancy  If you are premenopausal and you may become pregnant, ask your health care provider about preconception counseling.  If you may become pregnant, take 400 to 800 micrograms (mcg) of folic acid every day.  If you want to prevent pregnancy, talk to your health care provider about birth control (contraception). Osteoporosis and menopause  Osteoporosis is a disease in which the bones lose minerals and strength with aging. This can result in serious bone fractures. Your risk for osteoporosis can be identified using a bone density scan.  If you are 28 years of age or older, or if you are at risk for osteoporosis and fractures, ask your health care provider if you should be screened.  Ask your health care provider whether you should take a calcium or vitamin D supplement to lower your risk for osteoporosis.  Menopause may have certain physical symptoms and risks.  Hormone replacement therapy may reduce some of these symptoms and risks. Talk to your health care provider about whether hormone replacement therapy is right for you. Follow these instructions at home:  Schedule regular health, dental, and eye exams.  Stay current with your immunizations.  Do not use any tobacco products including cigarettes, chewing tobacco, or electronic cigarettes.  If you are pregnant, do not drink  alcohol.  If you are breastfeeding, limit how much and how often you drink alcohol.  Limit alcohol intake to no more than 1 drink per day for nonpregnant women. One drink equals 12 ounces of beer, 5 ounces of wine, or 1 ounces of hard liquor.  Do not use street drugs.  Do not share needles.  Ask your health care provider for help if you need support or information about quitting drugs.  Tell your health care provider if you often feel depressed.  Tell your health care provider if you have ever been abused or do not feel safe at home. This information is not intended to replace advice given to you by your health care provider. Make sure you discuss any questions you have with your health care provider. Document Released: 11/12/2010 Document Revised: 10/05/2015 Document Reviewed: 01/31/2015  2017 [employer]

## 2016-05-10 ENCOUNTER — Encounter: Payer: Medicare Other | Admitting: Nurse Practitioner

## 2016-05-10 DIAGNOSIS — E785 Hyperlipidemia, unspecified: Secondary | ICD-10-CM | POA: Insufficient documentation

## 2016-05-10 NOTE — Assessment & Plan Note (Signed)
No medication. Mild elevation. Will continue to monitor

## 2016-05-10 NOTE — Assessment & Plan Note (Signed)
Not at goal. Lipid Panel     Component Value Date/Time   CHOL 206 (H) 05/09/2016 0907   TRIG 135.0 05/09/2016 0907   HDL 59.20 05/09/2016 0907   CHOLHDL 3 05/09/2016 0907   VLDL 27.0 05/09/2016 0907   LDLCALC 120 (H) 05/09/2016 [id number]

## 2016-05-15 ENCOUNTER — Encounter: Payer: Medicare Other | Attending: Internal Medicine | Admitting: Internal Medicine

## 2016-05-15 DIAGNOSIS — M170 Bilateral primary osteoarthritis of knee: Secondary | ICD-10-CM | POA: Diagnosis not present

## 2016-05-15 DIAGNOSIS — T2135XA Burn of third degree of buttock, initial encounter: Secondary | ICD-10-CM | POA: Diagnosis not present

## 2016-05-15 DIAGNOSIS — T2135XD Burn of third degree of buttock, subsequent encounter: Secondary | ICD-10-CM | POA: Insufficient documentation

## 2016-05-15 DIAGNOSIS — X58XXXD Exposure to other specified factors, subsequent encounter: Secondary | ICD-10-CM | POA: Diagnosis not present

## 2016-05-15 DIAGNOSIS — L98412 Non-pressure chronic ulcer of buttock with fat layer exposed: Secondary | ICD-10-CM | POA: Insufficient documentation

## 2016-05-16 NOTE — Progress Notes (Addendum)
Dawn [name], Dawn [name] ([id number]) Visit Report for 05/15/2016 Chief Complaint Document Details Dawn [name], Dawn [name] Date of Service: 05/15/2016 9:00 AM Patient Name: T. Patient Account Number: [id number] Medical Record Treating RN: Baruch Gouty RN, BSN, [name] [id number] Number: Other Clinician: Oct 09, 1950 (66 y.o. Treating ROBSON, [place] Date of Birth/Sex: Female) Physician/Extender: G Primary [place], [place] Physician: Referring Physician: Wilfred Lacy Weeks in Treatment: 0 Information Obtained from: Patient Chief Complaint 05/15/16; the patient is here for review of a burn injury on her left buttock Electronic Signature(s) Signed: 05/15/2016 5:37:12 PM By: Linton Ham MD Entered By: Linton Ham on 05/15/2016 11:15:45 Dawn [name], Dawn [name] ([id number]) -------------------------------------------------------------------------------- Debridement Details Dawn [name], Dawn [name] Date of Service: 05/15/2016 9:00 AM Patient Name: T. Patient Account Number: [id number] Medical Record Treating RN: Baruch Gouty RN, BSN, [name] [id number] Number: Other Clinician: October 26, 1950 (66 y.o. Treating ROBSON, [place] Date of Birth/Sex: Female) Physician/Extender: G [place], [place] Physician: Referring Physician: NCHE, [place] Weeks in Treatment: 0 Debridement Performed for Wound #1 Left Gluteus Assessment: Performed By: Physician Ricard Dillon, MD Debridement: Debridement Pre-procedure Yes - 09:31 Verification/Time Out Taken: Start Time: 09:31 Pain Control: Lidocaine 4% Topical Solution Level: Skin/Subcutaneous Tissue Total Area Debrided (L x 2 (cm) x 3 (cm) = 6 (cm) W): Tissue and other Non-Viable, Eschar, Fibrin/Slough, Subcutaneous material debrided: Instrument: Curette Bleeding: Minimum Hemostasis Achieved: Pressure End Time: 09:36 Procedural Pain: 0 Post Procedural Pain: 0 Response to Treatment: Procedure was tolerated well Post Debridement Measurements of Total  Wound Length: (cm) 2 Width: (cm) 3 Depth: (cm) 0.1 Volume: (cm) 0.471 Character of Wound/Ulcer Post Requires Further Debridement Debridement: Severity of Tissue Post Debridement: Fat layer exposed Post Procedure Diagnosis Same as Pre-procedure Electronic Signature(s) DARRELYN, LEGERE ([id number]) Signed: 05/15/2016 4:04:09 PM By: Regan Lemming BSN, RN Signed: 05/15/2016 5:37:12 PM By: Linton Ham MD Entered By: Linton Ham on 05/15/2016 11:15:00 [place], Dawn [name] ([id number]) -------------------------------------------------------------------------------- HPI Details Dawn [name] Date of Service: 05/15/2016 9:00 AM Patient Name: T. Patient Account Number: [id number] Medical Record Treating RN: Baruch Gouty RN, BSN, [name] [id number] Number: Other Clinician: 31-Jul-1950 (66 y.o. Treating ROBSON, MICHAEL Date of Birth/Sex: Female) Physician/Extender: G [place], [place] Physician: Referring Physician: Wilfred Lacy Weeks in Treatment: 0 History of Present Illness HPI Description: 05/15/16; the patient is a reasonably healthy 66 year old woman who only has a history of what sounds like osteoarthritis of both knees and degenerative disc disease in her lower back with chronic back pain. In early December she was using a heating pad and woke up in the morning feeling an itching sensation on her left buttock. He was seen in her primary physician's office on 12/11 noted to have a second-degree burn of the buttock. She was prescribed clindamycin for associated cellulitis. Since then she has been using Neosporin. She did not describe any pain over the area at any point. This is likely to be a third-degree burn injury. Electronic Signature(s) Signed: 05/15/2016 5:37:12 PM By: Linton Ham MD Entered By: Linton Ham on 05/15/2016 11:46:07 [place], Dawn [name] ([id number]) -------------------------------------------------------------------------------- Physical Exam  Details Dawn [name] Date of Service: 05/15/2016 9:00 AM Patient Name: T. Patient Account Number: [id number] Medical Record Treating RN: Baruch Gouty RN, BSN, [name] [id number] Number: Other Clinician: 1951/03/03 (66 y.o. Treating ROBSON, MICHAEL Date of Birth/Sex: Female) Physician/Extender: G [place], [place] Physician: Referring Physician: NCHE, [place] Weeks in Treatment: 0 Constitutional Patient is hypertensive.. Pulse regular and within target range for patient.Marland Kitchen Respirations regular, non-labored and within target range.. Temperature is normal and within the target range for the patient.. Patient's  appearance is neat and clean. Appears in no acute distress. Well nourished and well developed.. Eyes Conjunctivae clear. No discharge.. Neck Neck supple and symmetrical. No masses or crepitus. Respiratory Respiratory effort is easy and symmetric bilaterally. Rate is normal at rest and on room air.. Cardiovascular Heart rhythm and rate regular, without murmur or gallop.. Integumentary (Hair, Skin) No suspicious rashes are noted.Marland Kitchen Psychiatric No evidence of depression, anxiety, or agitation. Calm, cooperative, and communicative. Appropriate interactions and affect.. Notes Wound exam; the areas on the upper aspect of her left buttock. Covered with a thick adherent eschar which was removed with pickups and scalpel. Under this nonviable tissue didn't righted with a #3 curet she tolerated this nicely. Hemostasis with direct pressure. Post debridement there is still surface material here which will need to be removed before we can get any healing. There was no evidence of surrounding cellulitis or soft tissue involvement Electronic Signature(s) Signed: 05/15/2016 5:37:12 PM By: Linton Ham MD Entered By: Linton Ham on 05/15/2016 11:49:21 Dawn [name], Dawn [name] ([id number]) -------------------------------------------------------------------------------- Physician Orders  Details Dawn [name] Date of Service: 05/15/2016 9:00 AM Patient Name: T. Patient Account Number: [id number] Medical Record Treating RN: Baruch Gouty RN, BSN, [name] [id number] Number: Other Clinician: 1950-07-23 (66 y.o. Treating ROBSON, MICHAEL Date of Birth/Sex: Female) Physician/Extender: G [place], [place] Physician: Referring Physician: NCHE, [place] Weeks in Treatment: 0 Verbal / Phone Orders: Yes Clinician: [place], RN, BSN, Rita Read Back and Verified: Yes Diagnosis Coding Wound Cleansing Wound #1 Left Gluteus o Cleanse wound with mild soap and water Primary Wound Dressing Wound #1 Left Gluteus o Santyl Ointment Secondary Dressing Wound #1 Left Gluteus o Boardered Foam Dressing Dressing Change Frequency Wound #1 Left Gluteus o Change dressing every day. Follow-up Appointments Wound #1 Left Gluteus o Return Appointment in 1 week. Additional Orders / Instructions Wound #1 Left Gluteus o Increase protein intake. o Other: - MVI,Vit C, VIt A,Zinc Electronic Signature(s) Signed: 05/15/2016 4:04:09 PM By: Regan Lemming BSN, RN Signed: 05/15/2016 5:37:12 PM By: Linton Ham MD Entered By: Regan Lemming on 05/15/2016 09:38:17 Dawn [name], Dawn [name] ([id number]) Dawn [name], Dawn [name] ([id number]) -------------------------------------------------------------------------------- Problem List Details Dawn [name] Date of Service: 05/15/2016 9:00 AM Patient Name: T. Patient Account Number: [id number] Medical Record Treating RN: Baruch Gouty RN, BSN, [name] [id number] Number: Other Clinician: 10/12/1950 (66 y.o. 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 Patient Account Number: [id number] Medical Record Treating RN: Baruch Gouty RN, BSN, [name] [id number] Number: Other Clinician: Oct 11, 1950 (66 y.o. Treating ROBSON, MICHAEL Date of Birth/Sex: Female) Physician/Extender: G [place], [place] Physician: Referring Physician: Wilfred Lacy Weeks in Treatment: 0 Subjective Chief Complaint Information obtained from Patient 05/15/16; the patient is here for review of a burn injury on her left buttock History of Present Illness (HPI) 05/15/16; the patient is a reasonably healthy 66 year old woman who only has a history of what sounds like osteoarthritis of both knees and degenerative disc disease in her lower back with chronic back pain. In early December she was using a heating  pad and woke up in the morning feeling an itching sensation on her left buttock. He was seen in her primary physician's office on 12/11 noted to have a second-degree burn of the buttock. She was prescribed clindamycin for associated cellulitis. Since then she has been using Neosporin. She did not describe any pain over the area at any point. This is likely to be a third-degree burn injury. Wound History Patient presents with 1 open wound that has been present for approximately 6weeks. Patient has been treating wound in the following manner: open to air. Laboratory tests have not been performed in the last month. Patient reportedly has not tested positive for an antibiotic resistant organism. Patient reportedly has not tested positive for osteomyelitis.  Patient reportedly has not had testing performed to evaluate circulation in the legs. Patient History Information obtained from Patient. Allergies seafood Family History No family history of Cancer, Diabetes, Heart Disease, Hereditary Spherocytosis, Hypertension, Kidney Disease, Lung Disease, Seizures, Stroke, Thyroid Problems, Tuberculosis. Social History Never smoker, Marital Status - Married, Alcohol Use - Rarely, Drug Use - No History, Caffeine Use - Rarely. Dawn [name], Dawn [name] ([id number]) Medical History Eyes Denies history of Cataracts, Glaucoma, Optic Neuritis Ear/Nose/Mouth/Throat Denies history of Chronic sinus problems/congestion, Middle ear problems Hematologic/Lymphatic Denies history of Anemia, Hemophilia, Human Immunodeficiency Virus, Lymphedema, Sickle Cell Disease Respiratory Denies history of Aspiration, Asthma, Chronic Obstructive Pulmonary Disease (COPD), Pneumothorax, Sleep Apnea, Tuberculosis Cardiovascular Denies history of Angina, Arrhythmia, Congestive Heart Failure, Coronary Artery Disease, Deep Vein Thrombosis, Hypertension, Hypotension, Myocardial Infarction, Peripheral Arterial Disease, Peripheral Venous Disease, Phlebitis, Vasculitis Gastrointestinal Denies history of Cirrhosis , Colitis, Crohn s, Hepatitis A, Hepatitis B, Hepatitis C Endocrine Denies history of Type I Diabetes, Type II Diabetes Genitourinary Denies history of End Stage Renal Disease Immunological Denies history of Lupus Erythematosus, Raynaud s, Scleroderma Integumentary (Skin) Patient has history of History of Burn Musculoskeletal Patient has history of Osteoarthritis Neurologic Denies history of Dementia, Neuropathy, Quadriplegia, Paraplegia, Seizure Disorder Oncologic Denies history of Received Chemotherapy, Received Radiation Review of Systems (ROS) Constitutional Symptoms (General Health) The patient has no complaints or symptoms. Eyes The patient has no complaints or  symptoms. Ear/Nose/Mouth/Throat The patient has no complaints or symptoms. Hematologic/Lymphatic The patient has no complaints or symptoms. Respiratory The patient has no complaints or symptoms. Cardiovascular The patient has no complaints or symptoms. Gastrointestinal The patient has no complaints or symptoms. Endocrine The patient has no complaints or symptoms. Genitourinary The patient has no complaints or symptoms. Immunological [place], Lorece T. ([id number]) The patient has no complaints or symptoms. Integumentary (Skin) Complains or has symptoms of Wounds. Denies complaints or symptoms of Bleeding or bruising tendency, Breakdown, Swelling. Musculoskeletal The patient has no complaints or symptoms. Neurologic The patient has no complaints or symptoms. Oncologic The patient has no complaints or symptoms. Objective Constitutional Patient is hypertensive.. Pulse regular and within target range for patient.Marland Kitchen Respirations regular, non-labored and within target range.. Temperature is normal and within the target range for the patient.. Patient's appearance is neat and clean. Appears in no acute distress. Well nourished and well developed.. Vitals Time Taken: 9:12 AM, Height: 69 in, Source: Stated, Weight: 245 lbs, Source: Stated, BMI: 36.2, Temperature: 97.8 F, Pulse: 73 bpm, Respiratory Rate: 18 breaths/min, Blood Pressure: 193/106 mmHg. General Notes: MD aware of bp. Patient anxious Eyes Conjunctivae clear. No discharge.. Neck Neck supple and symmetrical. No masses or crepitus. Respiratory Respiratory effort is easy and symmetric bilaterally. Rate is normal at rest and on room air.. Cardiovascular Heart rhythm and  rate regular, without murmur or gallop.Marland Kitchen Psychiatric No evidence of depression, anxiety, or agitation. Calm, cooperative, and communicative. Appropriate interactions and affect.. General Notes: Wound exam; the areas on the upper aspect of her left buttock.  Covered with a thick adherent eschar which was removed with pickups and scalpel. Under this nonviable tissue didn't righted with a #3 curet she tolerated this nicely. Hemostasis with direct pressure. Post debridement there is still surface material here which will need to be removed before we can get any healing. There was no evidence of surrounding cellulitis or soft tissue involvement Dawn [name], Dawn T. ([id number]) Integumentary (Hair, Skin) No suspicious rashes are noted.. Wound #1 status is Open. Original cause of wound was Thermal Burn. The wound is located on the Left Gluteus. The wound measures 2cm length x 3cm width x 0.1cm depth; 4.712cm^2 area and 0.471cm^3 volume. The wound is limited to skin breakdown. There is no tunneling or undermining noted. There is a medium amount of serous drainage noted. The wound margin is flat and intact. There is no granulation within the wound bed. There is a large (67-100%) amount of necrotic tissue within the wound bed including Eschar. The periwound skin appearance exhibited: Dry/Scaly. The periwound skin appearance did not exhibit: Callus, Crepitus, Excoriation, Fluctuance, Friable, Induration, Localized Edema, Rash, Scarring, Maceration, Moist, Atrophie Blanche, Cyanosis, Ecchymosis, Hemosiderin Staining, Mottled, Pallor, Rubor, Erythema. Periwound temperature was noted as No Abnormality. The periwound has tenderness on palpation. Assessment Active Problems ICD-10 L98.412 - Non-pressure chronic ulcer of buttock with fat layer exposed T21.35XD - Burn of third degree of buttock, subsequent encounter Procedures Wound #1 Wound #1 is a 3rd degree Burn located on the Left Gluteus . There was a Skin/Subcutaneous Tissue Debridement [id number]) debridement with total area of 6 sq cm performed by Ricard Dillon, MD. with the following instrument(s): Curette to remove Non-Viable tissue/material including Fibrin/Slough, Eschar, and Subcutaneous  after achieving pain control using Lidocaine 4% Topical Solution. A time out was conducted at 09:31, prior to the start of the procedure. A Minimum amount of bleeding was controlled with Pressure. The procedure was tolerated well with a pain level of 0 throughout and a pain level of 0 following the procedure. Post Debridement Measurements: 2cm length x 3cm width x 0.1cm depth; 0.471cm^3 volume. Character of Wound/Ulcer Post Debridement requires further debridement. Severity of Tissue Post Debridement is: Fat layer exposed. Post procedure Diagnosis Wound #1: Same as Pre-Procedure Dawn [name], Dawn T. ([id number]) Plan Wound Cleansing: Wound #1 Left Gluteus: Cleanse wound with mild soap and water Primary Wound Dressing: Wound #1 Left Gluteus: Santyl Ointment Secondary Dressing: Wound #1 Left Gluteus: Boardered Foam Dressing Dressing Change Frequency: Wound #1 Left Gluteus: Change dressing every day. Follow-up Appointments: Wound #1 Left Gluteus: Return Appointment in 1 week. Additional Orders / Instructions: Wound #1 Left Gluteus: Increase protein intake. Other: - MVI,Vit C, VIt A,Zinc #1 patient required an extensive debridement but will still need at least a week or 2 of Santyl to loosen up the necrotic tissue that is still over the surface likely to also have need of mechanical debridement next week. The patient tells me she is traveling evening in 10 days' time. I have advised keeping the pressure off this area. Santyl dressings as stated overt with a border foam dressing #2 as the patient was completely without any sensation of pain and necessary at any point I would last about a this is a third degree burn versus a full-thickness second-degree burn. This may be a matter of  semantics at this point Electronic Signature(s) Signed: 05/16/2016 4:23:41 PM By: Gretta Cool RN, BSN, Kim RN, BSN Signed: 05/21/2016 4:24:12 PM By: Linton Ham MD Previous Signature: 05/15/2016 5:37:12 PM  Version By: Linton Ham MD Entered By: Gretta Cool RN, BSN, Kim on 05/16/2016 16:20:04 [name] ([id number]) -------------------------------------------------------------------------------- ROS/PFSH Details Dawn [name] Date of Service: 05/15/2016 9:00 AM Patient Name: T. Patient Account Number: [id number] Medical Record Treating RN: Baruch Gouty RN, BSN, [name] [id number] Number: Other Clinician: [birth year] (66 y.o. Treating ROBSON, MICHAEL Date of Birth/Sex: Female) Physician/Extender: G [place], [place] Physician: Referring Physician: NCHE, [place] Weeks in Treatment: 0 Information Obtained From Patient Wound History Do you currently have one or more open woundso Yes How many open wounds do you currently haveo 1 Approximately how long have you had your woundso 6weeks How have you been treating your wound(s) until nowo open to air Has your wound(s) ever healed and then re-openedo No Have you had any lab work done in the past montho No Have you tested positive for an antibiotic resistant organism (MRSA, VRE)o No Have you tested positive for osteomyelitis (bone infection)o No Have you had any tests for circulation on your legso No Integumentary (Skin) Complaints and Symptoms: Positive for: Wounds Negative for: Bleeding or bruising tendency; Breakdown; Swelling Medical History: Positive for: History of Burn Constitutional Symptoms (General Health) Complaints and Symptoms: No Complaints or Symptoms Eyes Complaints and Symptoms: No Complaints or Symptoms Medical History: Negative for: Cataracts; Glaucoma; Optic Neuritis Ear/Nose/Mouth/Throat PHRONSIE, CUSSEN ([id number]) Complaints and Symptoms: No Complaints or Symptoms Medical History: Negative for: Chronic sinus problems/congestion; Middle ear problems Hematologic/Lymphatic Complaints and Symptoms: No Complaints or Symptoms Medical History: Negative for: Anemia; Hemophilia; Human  Immunodeficiency Virus; Lymphedema; Sickle Cell Disease Respiratory Complaints and Symptoms: No Complaints or Symptoms Medical History: Negative for: Aspiration; Asthma; Chronic Obstructive Pulmonary Disease (COPD); Pneumothorax; Sleep Apnea; Tuberculosis Cardiovascular Complaints and Symptoms: No Complaints or Symptoms Medical History: Negative for: Angina; Arrhythmia; Congestive Heart Failure; Coronary Artery Disease; Deep Vein Thrombosis; Hypertension; Hypotension; Myocardial Infarction; Peripheral Arterial Disease; Peripheral Venous Disease; Phlebitis; Vasculitis Gastrointestinal Complaints and Symptoms: No Complaints or Symptoms Medical History: Negative for: Cirrhosis ; Colitis; Crohnos; Hepatitis A; Hepatitis B; Hepatitis C Endocrine Complaints and Symptoms: No Complaints or Symptoms Medical History: Negative for: Type I Diabetes; Type II Diabetes Genitourinary Trotter, Tishie T. ([id number]) Complaints and Symptoms: No Complaints or Symptoms Medical History: Negative for: End Stage Renal Disease Immunological Complaints and Symptoms: No Complaints or Symptoms Medical History: Negative for: Lupus Erythematosus; Raynaudos; Scleroderma Musculoskeletal Complaints and Symptoms: No Complaints or Symptoms Medical History: Positive for: Osteoarthritis Neurologic Complaints and Symptoms: No Complaints or Symptoms Medical History: Negative for: Dementia; Neuropathy; Quadriplegia; Paraplegia; Seizure Disorder Oncologic Complaints and Symptoms: No Complaints or Symptoms Medical History: Negative for: Received Chemotherapy; Received Radiation Immunizations Pneumococcal Vaccine: Received Pneumococcal Vaccination: No Family and Social History Cancer: No; Diabetes: No; Heart Disease: No; Hereditary Spherocytosis: No; Hypertension: No; Kidney Disease: No; Lung Disease: No; Seizures: No; Stroke: No; Thyroid Problems: No; Tuberculosis: No; Never smoker; Marital Status -  Married; Alcohol Use: Rarely; Drug Use: No History; Caffeine Use: Rarely; Financial Concerns: No; Food, Clothing or Shelter Needs: No; Support System Lacking: No; Transportation Concerns: No; Advanced Directives: No; Living Will: No Electronic Signature(s) ANAYRA, STALLING ([id number]) Signed: 05/15/2016 4:04:09 PM By: Regan Lemming BSN, RN Signed: 05/15/2016 5:37:12 PM By: Linton Ham MD Entered By: Regan Lemming on 05/15/2016 09:22:11 [place], Dawn [name] ([id number]) -------------------------------------------------------------------------------- SuperBill Details [place], Zaliah Date of Service: 05/15/2016 Patient Name:  T. Patient Account Number: [id number] Medical Record Treating RN: Baruch Gouty RN, BSN, [name] [id number] Number: Other Clinician: November 08, 1950 (66 y.o. Treating ROBSON, [place] Date of Birth/Sex: Female) Physician/Extender: G [facility] Weeks in Treatment: [place], [place] Physician: Referring Physician: NCHE, [place] Diagnosis Coding ICD-10 Codes Code Description [phone] Non-pressure chronic ulcer of buttock with fat layer exposed T21.35XD Burn of third degree of buttock, subsequent encounter Facility Procedures CPT4 Code: [id number] Description: [id number] - WOUND CARE VISIT-LEV 3 EST PT Modifier: Quantity: 1 CPT4 Code: [id number] Description: [id number] - DEB SUBQ TISSUE 20 SQ CM/< ICD-10 Description Diagnosis L98.412 Non-pressure chronic ulcer of buttock with fat la T21.35XD Burn of third degree of buttock, subsequent encou Modifier: yer exposed nter Quantity: 1 Physician Procedures CPT4 [id number] Description: WC PHYS LEVEL 3 o NEW PT ICD-10 Description Diagnosis L98.412 Non-pressure chronic ulcer of buttock with fat l T21.35XD Burn of third degree of buttock, subsequent enco Modifier: 25 ayer exposed unter Quantity: 1 CPT4 Code: [id number] Description: 11042 - WC PHYS SUBQ TISS 20 SQ CM ICD-10 Description Diagnosis L98.412 Non-pressure chronic ulcer of buttock with fat  l T21.35XD Burn of third degree of buttock, subsequent enco Modifier: ayer exposed unter Quantity: 1 Electronic Signature(s) Signed: 05/15/2016 5:37:12 PM By: Linton Ham MD [place], Dawn [name] ([id number]) Entered By: Linton Ham on 05/15/2016 11:54:06

## 2016-05-16 NOTE — Progress Notes (Addendum)
HAYGEN, OGLETREE ([id number]) Visit Report for 05/15/2016 Allergy List Details [name], Dawn Date of Service: 05/15/2016 9:00 AM Patient Name: T. Patient Account Number: [id number] Medical Record Treating RN: Baruch Gouty RN, BSN, [name] [id number] Number: Other Clinician: Sep 11, 1950 (66 y.o. Treating [name], Dawn [name] Date of Birth/Sex: Female) Physician/Extender: G [place], [place] Physician: Referring Physician: NCHE, Dawn Weeks in Treatment: 0 Allergies Active Allergies seafood Allergy Notes Electronic Signature(s) Signed: 05/15/2016 9:16:41 AM By: Regan Lemming BSN, RN Entered By: Regan Lemming on 05/15/2016 09:16:41 Chiarelli, Dawn [name] ([id number]) -------------------------------------------------------------------------------- Arrival Information Details Neysa Hotter Date of Service: 05/15/2016 9:00 AM Patient Name: T. Patient Account Number: [id number] Medical Record Treating RN: Baruch Gouty RN, BSN, [name] [id number] Number: Other Clinician: 03-Sep-1950 (66 y.o. Treating [name], [place] Date of Birth/Sex: Female) Physician/Extender: G [place], [place] Physician: Referring Physician: NCHE, Dawn Weeks in Treatment: 0 Visit Information Patient Arrived: Ambulatory Arrival Time: 09:05 Accompanied By: SELF Transfer Assistance: None Patient Identification Verified: Yes Secondary Verification Process Yes Completed: Patient Requires Transmission-Based No Precautions: Patient Has Alerts: No Electronic Signature(s) Signed: 05/15/2016 4:04:09 PM By: Regan Lemming BSN, RN Entered By: Regan Lemming on 05/15/2016 09:05:50 Duplessis, Dawn [name] ([id number]) -------------------------------------------------------------------------------- Clinic Level of Care Assessment Details Neysa Hotter Date of Service: 05/15/2016 9:00 AM Patient Name: T. Patient Account Number: [id number] Medical Record Treating RN: Baruch Gouty RN, BSN, [name] [id number] Number: Other  Clinician: [birth year] (66 y.o. Treating [name], Dawn Date of Birth/Sex: Female) Physician/Extender: G [place], [place] Physician: Referring Physician: NCHE, Dawn Weeks in Treatment: 0 Clinic Level of Care Assessment Items TOOL 1 Quantity Score []  - Use when EandM and Procedure is performed on INITIAL visit 0 ASSESSMENTS - Nursing Assessment / Reassessment X - General Physical Exam (combine w/ comprehensive assessment (listed just 1 20 below) when performed on new pt. evals) X - Comprehensive Assessment (HX, ROS, Risk Assessments, Wounds Hx, etc.) 1 25 ASSESSMENTS - Wound and Skin Assessment / Reassessment []  - Dermatologic / Skin Assessment (not related to wound area) 0 ASSESSMENTS - Ostomy and/or Continence Assessment and Care []  - Incontinence Assessment and Management 0 []  - Ostomy Care Assessment and Management (repouching, etc.) 0 PROCESS - Coordination of Care X - Simple Patient / Family Education for ongoing care 1 15 []  - Complex (extensive) Patient / Family Education for ongoing care 0 X - Staff obtains Consents, Records, Test Results / Process Orders 1 10 []  - Staff telephones HHA, Nursing Homes / Clarify orders / etc 0 []  - Routine Transfer to another Facility (non-emergent condition) 0 []  - Routine Hospital Admission (non-emergent condition) 0 X - New Admissions / Biomedical engineer / Ordering NPWT, Apligraf, etc. 1 15 []  - Emergency Hospital Admission (emergent condition) 0 PROCESS - Special Needs Ortwein, Salvatrice T. ([id number]) []  - Pediatric / Minor Patient Management 0 []  - Isolation Patient Management 0 []  - Hearing / Language / Visual special needs 0 []  - Assessment of Community assistance (transportation, D/C planning, etc.) 0 []  - Additional assistance / Altered mentation 0 []  - Support Surface(s) Assessment (bed, cushion, seat, etc.) 0 INTERVENTIONS - Miscellaneous []  - External ear exam 0 []  - Patient Transfer (multiple staff /  Civil Service fast streamer / Similar devices) 0 []  - Simple Staple / Suture removal (25 or less) 0 []  - Complex Staple / Suture removal (26 or more) 0 []  - Hypo/Hyperglycemic Management (do not check if billed separately) 0 []  - Ankle / Brachial Index (ABI) - do not check if billed separately 0 Has the patient been seen  at the hospital within the last three years: Yes Total Score: 85 Level Of Care: New/Established - Level 3 Electronic Signature(s) Signed: 05/15/2016 4:04:09 PM By: Regan Lemming BSN, RN Entered By: Regan Lemming on 05/15/2016 09:36:17 Tassinari, Dawn [name] ([id number]) -------------------------------------------------------------------------------- Encounter Discharge Information Details Neysa Hotter Date of Service: 05/15/2016 9:00 AM Patient Name: T. Patient Account Number: [id number] Medical Record Treating RN: Baruch Gouty RN, BSN, [name] [id number] Number: Other Clinician: Dec 31, 1950 (66 y.o. Treating [name], Dawn [name] Date of Birth/Sex: Female) Physician/Extender: G [place], [place] Physician: Referring Physician: NCHE, Dawn Weeks in Treatment: 0 Encounter Discharge Information Items Discharge Pain Level: 0 Discharge Condition: Stable Ambulatory Status: Ambulatory Discharge Destination: Home Transportation: Other Accompanied By: self Schedule Follow-up Appointment: No Medication Reconciliation completed and provided to Patient/Care No Dawn [name]: Provided on Clinical Summary of Care: 05/15/2016 Form Type Recipient Paper Patient CC Electronic Signature(s) Signed: 05/15/2016 3:58:00 PM By: Regan Lemming BSN, RN Previous Signature: 05/15/2016 9:47:13 AM Version By: Ruthine Dose Entered By: Regan Lemming on 05/15/2016 15:58:00 Dawn [name], Dawn [name] ([id number]) -------------------------------------------------------------------------------- Lower Extremity Assessment Details Neysa Hotter Date of Service: 05/15/2016 9:00 AM Patient Name: T. Patient Account Number:  [id number] Medical Record Treating RN: Baruch Gouty RN, BSN, [name] [id number] Number: Other Clinician: 12-27-1950 (66 y.o. Treating [name], [name] Date of Birth/Sex: Female) Physician/Extender: G [place], [place] Physician: Referring Physician: Wilfred Lacy Weeks in Treatment: 0 Electronic Signature(s) Signed: 05/15/2016 9:16:29 AM By: Regan Lemming BSN, RN Entered By: Regan Lemming on 05/15/2016 09:16:29 Tricarico, Dawn [name] ([id number]) -------------------------------------------------------------------------------- Multi Wound Chart Details Maulden, Lovely Date of Service: 05/15/2016 9:00 AM Patient Name: T. Patient Account Number: [id number] Medical Record Treating RN: Baruch Gouty RN, BSN, [name] [id number] Number: Other Clinician: [birth year] (66 y.o. Treating [name], Dawn Date of Birth/Sex: Female) Physician/Extender: G [place], [place] Physician: Referring Physician: NCHE, Dawn Weeks in Treatment: 0 Vital Signs Height(in): 69 Pulse(bpm): 73 Weight(lbs): 245 Blood Pressure 193/106 (mmHg): Body Mass Index(BMI): 36 Temperature(F): 97.8 Respiratory Rate 18 (breaths/min): Photos: [1:No Photos] [N/A:N/A] Wound Location: [1:Left [place]ium] [N/A:N/A] Wounding Event: [1:Thermal Burn] [N/A:N/A] Primary Etiology: [1:3rd degree Burn] [N/A:N/A] Date Acquired: [1:03/26/2016] [N/A:N/A] Weeks of Treatment: [1:0] [N/A:N/A] Wound Status: [1:Open] [N/A:N/A] Measurements L x W x D 2x3x0.1 [N/A:N/A] (cm) Area (cm) : [1:4.712] [N/A:N/A] Volume (cm) : [1:0.471] [N/A:N/A] Classification: [1:Unclassifiable] [N/A:N/A] Exudate Amount: [1:None Present] [N/A:N/A] Wound Margin: [1:Flat and Intact] [N/A:N/A] Granulation Amount: [1:None Present (0%)] [N/A:N/A] Necrotic Amount: [1:Large (67-100%)] [N/A:N/A] Necrotic Tissue: [1:Eschar] [N/A:N/A] Exposed Structures: [1:Fascia: No Fat: No Tendon: No Muscle: No Joint: No Bone: No Limited to Skin Breakdown]  [N/A:N/A] Epithelialization: [1:None] [N/A:N/A] Periwound Skin Texture: Edema: No N/A N/A Excoriation: No Induration: No Callus: No Crepitus: No Fluctuance: No Friable: No Rash: No Scarring: No Periwound Skin Dry/Scaly: Yes N/A N/A Moisture: Maceration: No Moist: No Periwound Skin Color: Atrophie Blanche: No N/A N/A Cyanosis: No Ecchymosis: No Erythema: No Hemosiderin Staining: No Mottled: No Pallor: No Rubor: No Temperature: No Abnormality N/A N/A Tenderness on Yes N/A N/A Palpation: Wound Preparation: Ulcer Cleansing: N/A N/A Rinsed/Irrigated with Saline Topical Anesthetic Applied: Other: lidocaine 4% Treatment Notes Electronic Signature(s) Signed: 05/15/2016 4:04:09 PM By: Regan Lemming BSN, RN Entered By: Regan Lemming on 05/15/2016 09:30:38 [name] ([id number]) -------------------------------------------------------------------------------- Multi-Disciplinary Care Plan Details Neysa Hotter Date of Service: 05/15/2016 9:00 AM Patient Name: T. Patient Account Number: [id number] Medical Record Treating RN: Baruch Gouty RN, BSN, [name] [id number] Number: Other Clinician: 21-May-1950 (66 y.o. Treating [name], Dawn Date of Birth/Sex: Female) Physician/Extender: G [place], [place] Physician: Referring Physician: NCHE, Dawn  Weeks in Treatment: 0 Active Inactive Orientation to the Wound Care Program Nursing Diagnoses: Knowledge deficit related to the wound healing [name] program Goals: Patient/caregiver will verbalize understanding of the [place] Program Date Initiated: 05/15/2016 Goal Status: Active Interventions: Provide education on orientation to the wound [name] Notes: Wound/Skin Impairment Nursing Diagnoses: Impaired tissue integrity Knowledge deficit related to ulceration/compromised skin integrity Goals: Patient/caregiver will verbalize understanding of skin care regimen Date Initiated: 05/15/2016 Goal Status:  Active Ulcer/skin breakdown will have a volume reduction of 30% by week 4 Date Initiated: 05/15/2016 Goal Status: Active Ulcer/skin breakdown will have a volume reduction of 50% by week 8 Date Initiated: 05/15/2016 Goal Status: Active Ulcer/skin breakdown will have a volume reduction of 80% by week 12 Date Initiated: 05/15/2016 [name] ([id number]) Goal Status: Active Ulcer/skin breakdown will heal within 14 weeks Date Initiated: 05/15/2016 Goal Status: Active Interventions: Assess patient/caregiver ability to obtain necessary supplies Assess patient/caregiver ability to perform ulcer/skin care regimen upon admission and as needed Assess ulceration(s) every visit Provide education on ulcer and skin care Treatment Activities: Referred to DME Ysmael Hires for dressing supplies : 05/15/2016 Skin care regimen initiated : 05/15/2016 Topical wound management initiated : 05/15/2016 Notes: Electronic Signature(s) Signed: 05/15/2016 4:04:09 PM By: Regan Lemming BSN, RN Entered By: Regan Lemming on 05/15/2016 09:29:49 Forinash, Dawn [name] ([id number]) -------------------------------------------------------------------------------- Pain Assessment Details Claire, Romonda Date of Service: 05/15/2016 9:00 AM Patient Name: T. Patient Account Number: [id number] Medical Record Treating RN: Baruch Gouty RN, BSN, [name] [id number] Number: Other Clinician: [birth year] (66 y.o. Treating [name], [place] Date of Birth/Sex: Female) Physician/Extender: [place], [place] Physician: Referring Physician: NCHE, Dawn Weeks in Treatment: 0 Active Problems Location of Pain Severity and Description of Pain Patient Has Paino No Site Locations With Dressing Change: No Pain Management and Medication Current Pain Management: Electronic Signature(s) Signed: 05/15/2016 9:16:18 AM By: Regan Lemming BSN, RN Entered By: Regan Lemming on 05/15/2016 09:16:18 [name]  ([id number]) -------------------------------------------------------------------------------- Patient/Caregiver Education Details Neysa Hotter Date of Service: 05/15/2016 9:00 AM Patient Name: T. Patient Account Number: [id number] Medical Record Treating RN: Baruch Gouty RN, BSN, [name] [id number] Number: Other Clinician: [birth year] (66 y.o. Treating [name], [place] Date of Birth/Gender: Female) Physician/Extender: G Primary Care Weeks in Treatment: 0 [name], [place] Physician: Referring Physician: Wilfred Lacy Education Assessment Education Provided To: Patient Education Topics Provided Welcome To [facility]: Methods: Explain/Verbal Responses: State content correctly Wound/Skin Impairment: Methods: Explain/Verbal Responses: State content correctly Electronic Signature(s) Signed: 05/15/2016 4:04:09 PM By: Regan Lemming BSN, RN Entered By: Regan Lemming on 05/15/2016 15:58:12 Wolfman, Dawn [name] ([id number]) -------------------------------------------------------------------------------- Wound Assessment Details Walen, Lotus Date of Service: 05/15/2016 9:00 AM Patient Name: T. Patient Account Number: [id number] Medical Record Treating RN: Baruch Gouty RN, BSN, [name] [id number] Number: Other Clinician: 1950-12-17 (66 y.o. Treating [name], Dawn Date of Birth/Sex: Female) Physician/Extender: G Primary [place], [place] Physician: Referring Physician: NCHE, Dawn Weeks in Treatment: 0 Wound Status Wound Number: 1 Primary Etiology: 3rd degree Burn Wound Location: Left Gluteus Wound Status: Open Wounding Event: Thermal Burn Comorbid History: History of Burn, Osteoarthritis Date Acquired: 03/26/2016 Weeks Of Treatment: 0 Clustered Wound: No Photos Wound Measurements Length: (cm) 2 % Reduction in Area: Width: (cm) 3 % Reduction in Volume Depth: (cm) 0.1 Epithelialization: Area: (cm) 4.712 Tunneling: Volume: (cm) 0.471 Undermining: 0% :  0% None No No Wound Description Classification: Unclassifiable Foul Odor After Clea Wound Margin: Flat and Intact Severs, Tailor T. ([id number]) nsing: No Exudate Amount: Medium Exudate Type: Serous Exudate Color: amber Wound  Bed Granulation Amount: None Present (0%) Exposed Structure Necrotic Amount: Large (67-100%) Fascia Exposed: No Necrotic Quality: Eschar Fat Layer Exposed: No Tendon Exposed: No Muscle Exposed: No Joint Exposed: No Bone Exposed: No Limited to Skin Breakdown Periwound Skin Texture Texture Color No Abnormalities Noted: No No Abnormalities Noted: No Callus: No Atrophie Blanche: No Crepitus: No Cyanosis: No Excoriation: No Ecchymosis: No Fluctuance: No Erythema: No Friable: No Hemosiderin Staining: No Induration: No Mottled: No Localized Edema: No Pallor: No Rash: No Rubor: No Scarring: No Temperature / Pain Moisture Temperature: No Abnormality No Abnormalities Noted: No Tenderness on Palpation: Yes Dry / Scaly: Yes Maceration: No Moist: No Wound Preparation Ulcer Cleansing: Rinsed/Irrigated with Saline Topical Anesthetic Applied: Other: lidocaine 4%, Treatment Notes Wound #1 (Left Gluteus) 1. Cleansed with: Clean wound with Normal Saline 4. Dressing Applied: Santyl Ointment 5. Secondary Dressing Applied Bordered Foam Dressing Dry Gauze Electronic Signature(s) GRETTA, WOMBLES ([id number]) Signed: 05/16/2016 4:23:41 PM By: Gretta Cool RN, BSN, Kim RN, BSN Signed: 05/17/2016 5:32:07 PM By: Regan Lemming BSN, RN Previous Signature: 05/15/2016 4:04:09 PM Version By: Regan Lemming BSN, RN Entered By: Gretta Cool, RN, BSN, Kim on 05/16/2016 15:42:36 [place], Dawn [name] ([id number]) -------------------------------------------------------------------------------- Vitals Details Boulais, Linet Date of Service: 05/15/2016 9:00 AM Patient Name: T. Patient Account Number: [id number] Medical Record Treating RN: Baruch Gouty RN, BSN,  [name] [id number] Number: Other Clinician: Oct 07, 1950 (66 y.o. Treating [name], Dawn Date of Birth/Sex: Female) Physician/Extender: G [place], [place] Physician: Referring Physician: NCHE, Dawn Weeks in Treatment: 0 Vital Signs Time Taken: 09:12 Temperature (F): 97.8 Height (in): 69 Pulse (bpm): 73 Source: Stated Respiratory Rate (breaths/min): 18 Weight (lbs): 245 Blood Pressure (mmHg): 193/106 Source: Stated Reference Range: 80 - 120 mg / dl Body Mass Index (BMI): 36.2 Notes MD aware of bp. Patient anxious Electronic Signature(s) Signed: 05/15/2016 4:04:09 PM By: Regan Lemming BSN, RN Entered By: Regan Lemming on 05/15/2016 09:14:54

## 2016-05-16 NOTE — Progress Notes (Signed)
ERIK, MESAROS ([id number]) Visit Report for 05/15/2016 Abuse/Suicide Risk Screen Details Tolin, Kaelea Date of Service: 05/15/2016 9:00 AM Patient Name: T. Patient Account Number: [id number] Medical Record Treating RN: Baruch Gouty RN, BSN, [name] [id number] Number: Other Clinician: [birth year] (66 y.o. Treating ROBSON, MICHAEL Date of Birth/Sex: Female) Physician/Extender: G Primary [place], [place] Physician: Referring Physician: NCHE, [place] Weeks in Treatment: 0 Abuse/Suicide Risk Screen Items Answer ABUSE/SUICIDE RISK SCREEN: Has anyone close to you tried to hurt or harm you recentlyo No Do you feel uncomfortable with anyone in your familyo No Has anyone forced you do things that you didnot want to doo No Do you have any thoughts of harming yourselfo No Patient displays signs or symptoms of abuse and/or neglect. No Electronic Signature(s) Signed: 05/15/2016 4:04:09 PM By: Regan Lemming BSN, RN Entered By: Regan Lemming on 05/15/2016 09:11:46 Koziol, Apolinar Junes ([id number]) -------------------------------------------------------------------------------- Activities of Daily Living Details Neysa Hotter Date of Service: 05/15/2016 9:00 AM Patient Name: T. Patient Account Number: [id number] Medical Record Treating RN: Baruch Gouty RN, BSN, [name] [id number] Number: Other Clinician: April 19, 1951 (66 y.o. Treating ROBSON, MICHAEL Date of Birth/Sex: Female) Physician/Extender: G Primary [place], [place] Physician: Referring Physician: NCHE, [place] Weeks in Treatment: 0 Activities of Daily Living Items Answer Activities of Daily Living (Please select one for each item) Drive Automobile Completely Able Take Medications Completely Able Use Telephone Completely Able Care for Appearance Completely Able Use Toilet Completely Able Bath / Shower Completely Able Dress Self Completely Able Feed Self Completely Able Walk Completely Able Get In / Out Bed Completely  Able Housework Completely Able Prepare Meals Completely [place] for Self Completely Able Electronic Signature(s) Signed: 05/15/2016 4:04:09 PM By: Regan Lemming BSN, RN Entered By: Regan Lemming on 05/15/2016 09:12:05 [name] ([id number]) -------------------------------------------------------------------------------- Education Assessment Details Sedor, Riyan Date of Service: 05/15/2016 9:00 AM Patient Name: T. Patient Account Number: [id number] Medical Record Treating RN: Baruch Gouty RN, BSN, [name] [id number] Number: Other Clinician: 11/13/1950 (66 y.o. Treating ROBSON, MICHAEL Date of Birth/Sex: Female) Physician/Extender: G [place], [place] Physician: Referring Physician: Wilfred Lacy Weeks in Treatment: 0 Primary Learner Assessed: Patient Learning Preferences/Education Level/Primary Language Learning Preference: Explanation Highest Education Level: College or Above Preferred Language: English Cognitive Barrier Assessment/Beliefs Language Barrier: No Physical Barrier Assessment Impaired Vision: No Impaired Hearing: No Decreased Hand dexterity: No Knowledge/Comprehension Assessment Knowledge Level: High Comprehension Level: High Ability to understand written High instructions: Ability to understand verbal High instructions: Motivation Assessment Anxiety Level: Calm Cooperation: Cooperative Education Importance: Acknowledges Need Interest in Health Problems: Asks Questions Perception: Coherent Willingness to Engage in Self- High Management Activities: Readiness to Engage in Self- High Management Activities: Electronic Signature(s) Signed: 05/15/2016 9:17:05 AM By: Regan Lemming BSN, RN [place], Apolinar Junes ([id number]) Entered By: Regan Lemming on 05/15/2016 09:17:05 Blue, Apolinar Junes ([id number]) -------------------------------------------------------------------------------- Fall Risk Assessment  Details Milliron, Rodnisha Date of Service: 05/15/2016 9:00 AM Patient Name: T. Patient Account Number: [id number] Medical Record Treating RN: Baruch Gouty RN, BSN, [name] [id number] Number: Other Clinician: 24-Feb-1951 (66 y.o. Treating ROBSON, MICHAEL Date of Birth/Sex: Female) Physician/Extender: [place], [place] Physician: Referring Physician: NCHE, [place] Weeks in Treatment: 0 Fall Risk Assessment Items Have you had 2 or more falls in the last 12 monthso 0 No Have you had any fall that resulted in injury in the last 12 monthso 0 No FALL RISK ASSESSMENT: History of falling - immediate or within 3 months 0 No Secondary diagnosis 0 No Ambulatory aid None/bed rest/wheelchair/nurse 0 Yes Crutches/cane/walker 0  No Furniture 0 No IV Access/Saline Lock 0 No Gait/Training Normal/bed rest/immobile 0 Yes Weak 0 No Impaired 0 No Mental Status Oriented to own ability 0 Yes Electronic Signature(s) Signed: 05/15/2016 9:17:20 AM By: Regan Lemming BSN, RN Entered By: Regan Lemming on 05/15/2016 09:17:20 Pinkney, Apolinar Junes ([id number]) -------------------------------------------------------------------------------- Foot Assessment Details Petties, Irmalee Date of Service: 05/15/2016 9:00 AM Patient Name: T. Patient Account Number: [id number] Medical Record Treating RN: Baruch Gouty RN, BSN, [name] [id number] Number: Other Clinician: 1950-07-14 (66 y.o. Treating ROBSON, [place] Date of Birth/Sex: Female) Physician/Extender: G Primary [place], [place] Physician: Referring Physician: NCHE, [place] Weeks in Treatment: 0 Foot Assessment Items Site Locations + = Sensation present, - = Sensation absent, C = Callus, U = Ulcer R = Redness, W = Warmth, M = Maceration, PU = Pre-ulcerative lesion F = Fissure, S = Swelling, D = Dryness Assessment Right: Left: Other Deformity: No No Prior Foot Ulcer: No No Prior Amputation: No No Charcot Joint: No No Ambulatory Status: Ambulatory Without  Help Gait: Steady Electronic Signature(s) Signed: 05/15/2016 4:04:09 PM By: Regan Lemming BSN, RN Entered By: Regan Lemming on 05/15/2016 09:12:35 Raimer, Apolinar Junes ([id number]) Gitto, Apolinar Junes ([id number]) -------------------------------------------------------------------------------- Nutrition Risk Assessment Details Mohamed, Angellica Date of Service: 05/15/2016 9:00 AM Patient Name: T. Patient Account Number: [id number] Medical Record Treating RN: Baruch Gouty RN, BSN, [name] [id number] Number: Other Clinician: 29-Jun-1950 (66 y.o. Treating ROBSON, MICHAEL Date of Birth/Sex: Female) Physician/Extender: G [place], [place] Physician: Referring Physician: NCHE, [place] Weeks in Treatment: 0 Height (in): Weight (lbs): Body Mass Index (BMI): Nutrition Risk Assessment Items NUTRITION RISK SCREEN: I have an illness or condition that made me change the kind and/or 0 No amount of food I eat I eat fewer than two meals per day 0 No I eat few fruits and vegetables, or milk products 0 No I have three or more drinks of beer, liquor or wine almost every day 0 No I have tooth or mouth problems that make it hard for me to eat 0 No I don't always have enough money to buy the food I need 0 No I eat alone most of the time 0 No I take three or more different prescribed or over-the-counter drugs a 0 No day Without wanting to, I have lost or gained 10 pounds in the last six 0 No months I am not always physically able to shop, cook and/or feed myself 0 No Nutrition Protocols Good Risk Protocol 0 No interventions needed Moderate Risk Protocol Electronic Signature(s) Signed: 05/15/2016 4:04:09 PM By: Regan Lemming BSN, RN Entered By: Regan Lemming on 05/15/2016 09:12:12

## 2016-05-22 ENCOUNTER — Encounter: Payer: Medicare Other | Admitting: Internal Medicine

## 2016-05-22 DIAGNOSIS — T2135XA Burn of third degree of buttock, initial encounter: Secondary | ICD-10-CM | POA: Diagnosis not present

## 2016-05-22 DIAGNOSIS — L98412 Non-pressure chronic ulcer of buttock with fat layer exposed: Secondary | ICD-10-CM | POA: Diagnosis not present

## 2016-05-22 DIAGNOSIS — M170 Bilateral primary osteoarthritis of knee: Secondary | ICD-10-CM | POA: Diagnosis not present

## 2016-05-22 DIAGNOSIS — T2135XD Burn of third degree of buttock, subsequent encounter: Secondary | ICD-10-CM | POA: Diagnosis not present

## 2016-05-23 NOTE — Progress Notes (Addendum)
OUMY, EUDY ([id number]) Visit Report for 05/22/2016 Arrival Information Details Neysa Hotter Date of Service: 05/22/2016 11:00 AM Patient Name: T. Patient Account Number: [id number] Medical Record Treating RN: Baruch Gouty RN, BSN, [name] [id number] Number: Other Clinician: Sep 27, 1950 (66 y.o. Treating ROBSON, [place] Date of Birth/Sex: Female) Physician/Extender: G [place], [place] Physician: Referring Physician: NCHE, [place] Weeks in Treatment: 1 Visit Information History Since Last Visit All ordered tests and consults were completed: No Patient Arrived: Ambulatory Added or deleted any medications: No Arrival Time: 11:07 Any new allergies or adverse reactions: No Accompanied By: self Had a fall or experienced change in No Transfer Assistance: None activities of daily living that may affect Patient Identification Verified: Yes risk of falls: Secondary Verification Process Yes Signs or symptoms of abuse/neglect since last No Completed: visito Patient Requires Transmission-Based No Hospitalized since last visit: No Precautions: Has Dressing in Place as Prescribed: Yes Patient Has Alerts: No Pain Present Now: No Electronic Signature(s) Signed: 05/22/2016 4:54:43 PM By: Regan Lemming BSN, RN Entered By: Regan Lemming on 05/22/2016 11:07:50 Games, Apolinar Junes ([id number]) -------------------------------------------------------------------------------- Encounter Discharge Information Details Neysa Hotter Date of Service: 05/22/2016 11:00 AM Patient Name: T. Patient Account Number: [id number] Medical Record Treating RN: Baruch Gouty RN, BSN, [name] [id number] Number: Other Clinician: November 29, 1950 (66 y.o. Treating ROBSON, [place] Date of Birth/Sex: Female) Physician/Extender: G [place], [place] Physician: Referring Physician: Wilfred Lacy Weeks in Treatment: 1 Encounter Discharge Information Items Schedule Follow-up Appointment: No Medication  Reconciliation completed No and provided to Patient/Care Arrion Burruel: Provided on Clinical Summary of Care: 05/22/2016 Form Type Recipient Paper Patient CC Electronic Signature(s) Signed: 05/22/2016 11:35:26 AM By: Ruthine Dose Entered By: Ruthine Dose on 05/22/2016 11:35:26 Griggs, Apolinar Junes ([id number]) -------------------------------------------------------------------------------- Lower Extremity Assessment Details Santmyer, Alexzandria Date of Service: 05/22/2016 11:00 AM Patient Name: T. Patient Account Number: [id number] Medical Record Treating RN: Baruch Gouty RN, BSN, [name] [id number] Number: Other Clinician: 02-23-1951 (66 y.o. Treating ROBSON, [place] Date of Birth/Sex: Female) Physician/Extender: G [place], [place] Physician: Referring Physician: Wilfred Lacy Weeks in Treatment: 1 Electronic Signature(s) Signed: 05/22/2016 4:54:43 PM By: Regan Lemming BSN, RN Entered By: Regan Lemming on 05/22/2016 11:11:44 Fant, Apolinar Junes ([id number]) -------------------------------------------------------------------------------- Multi Wound Chart Details Neysa Hotter Date of Service: 05/22/2016 11:00 AM Patient Name: T. Patient Account Number: [id number] Medical Record Treating RN: Baruch Gouty RN, BSN, [name] [id number] Number: Other Clinician: 10-16-1950 (66 y.o. Treating ROBSON, MICHAEL Date of Birth/Sex: Female) Physician/Extender: G [place], [place] Physician: Referring Physician: NCHE, [place] Weeks in Treatment: 1 Vital Signs Height(in): 69 Pulse(bpm): 75 Weight(lbs): 245 Blood Pressure 170/100 (mmHg): Body Mass Index(BMI): 36 Temperature(F): 97.7 Respiratory Rate 18 (breaths/min): Photos: [1:No Photos] [N/A:N/A] Wound Location: [1:Left Gluteus] [N/A:N/A] Wounding Event: [1:Thermal Burn] [N/A:N/A] Primary Etiology: [1:3rd degree Burn] [N/A:N/A] Comorbid History: [1:History of Burn, Osteoarthritis] [N/A:N/A] Date Acquired: [1:03/26/2016]  [N/A:N/A] Weeks of Treatment: [1:1] [N/A:N/A] Wound Status: [1:Open] [N/A:N/A] Measurements L x W x D 1.2x2x0.1 [N/A:N/A] (cm) Area (cm) : [1:1.885] [N/A:N/A] Volume (cm) : [1:0.188] [N/A:N/A] % Reduction in Area: [1:60.00%] [N/A:N/A] % Reduction in Volume: 60.10% [N/A:N/A] Classification: [1:Full Thickness Without Exposed Support Structures] [N/A:N/A] Exudate Amount: [1:Medium] [N/A:N/A] Exudate Type: [1:Serous] [N/A:N/A] Exudate Color: [1:amber] [N/A:N/A] Wound Margin: [1:Flat and Intact] [N/A:N/A] Granulation Amount: [1:None Present (0%)] [N/A:N/A] Necrotic Amount: [1:Large (67-100%)] [N/A:N/A] Exposed Structures: [1:Fascia: No Fat: No] [N/A:N/A] Tendon: No Muscle: No Joint: No Bone: No Limited to Skin Breakdown Epithelialization: None N/A N/A Debridement: Debridement [id number] N/A N/A [id number]) Pre-procedure 11:26 N/A N/A Verification/Time Out Taken: Pain Control: Lidocaine 4%  Topical N/A N/A Solution Tissue Debrided: Fibrin/Slough, N/A N/A Subcutaneous Level: Skin/Subcutaneous N/A N/A Tissue Debridement Area (sq 2.4 N/A N/A cm): Instrument: Curette N/A N/A Bleeding: Minimum N/A N/A Hemostasis Achieved: Pressure N/A N/A Procedural Pain: 0 N/A N/A Post Procedural Pain: 0 N/A N/A Debridement Treatment Procedure was tolerated N/A N/A Response: well Post Debridement 1.2x2x0.1 N/A N/A Measurements L x W x D (cm) Post Debridement 0.188 N/A N/A Volume: (cm) Periwound Skin Texture: Edema: No N/A N/A Excoriation: No Induration: No Callus: No Crepitus: No Fluctuance: No Friable: No Rash: No Scarring: No Periwound Skin Moist: Yes N/A N/A Moisture: Maceration: No Dry/Scaly: No Periwound Skin Color: Atrophie Blanche: No N/A N/A Cyanosis: No Ecchymosis: No Erythema: No Hemosiderin Staining: No Mottled: No Senna, Dylan T. ([id number]) Pallor: No Rubor: No Temperature: No Abnormality N/A N/A Tenderness on Yes N/A N/A Palpation: Wound  Preparation: Ulcer Cleansing: N/A N/A Rinsed/Irrigated with Saline Topical Anesthetic Applied: Other: lidocaine 4% Procedures Performed: Debridement N/A N/A Treatment Notes Electronic Signature(s) Signed: 05/22/2016 6:05:53 PM By: Linton Ham MD Entered By: Linton Ham on 05/22/2016 11:41:41 Kaley, Apolinar Junes ([id number]) -------------------------------------------------------------------------------- Multi-Disciplinary Care Plan Details Neysa Hotter Date of Service: 05/22/2016 11:00 AM Patient Name: T. Patient Account Number: [id number] Medical Record Treating RN: Baruch Gouty RN, BSN, [name] [id number] Number: Other Clinician: Date of Birth/Sex: 1950-08-23 (66 y.o. Female) Treating ROBSON, MICHAEL Primary Care Jamarques Pinedo: Wilfred Lacy Yanuel Tagg/Extender: G Referring Alexandar Weisenberger: Wilfred Lacy Weeks in Treatment: 1 Active Inactive Electronic Signature(s) Signed: 06/04/2016 2:20:39 PM By: Gretta Cool RN, BSN, Kim RN, BSN Signed: 09/10/2016 4:23:42 PM By: Regan Lemming BSN, RN Previous Signature: 05/22/2016 4:54:43 PM Version By: Regan Lemming BSN, RN Entered By: Gretta Cool, RN, BSN, Kim on 06/04/2016 14:20:38 Elderkin, Apolinar Junes ([id number]) -------------------------------------------------------------------------------- Pain Assessment Details Humber, Isaly Date of Service: 05/22/2016 11:00 AM Patient Name: T. Patient Account Number: [id number] Medical Record Treating RN: Baruch Gouty RN, BSN, [name] [id number] Number: Other Clinician: 1951-04-27 (66 y.o. Treating ROBSON, [place] Date of Birth/Sex: Female) Physician/Extender: [place], [place] Physician: Referring Physician: NCHE, [place] Weeks in Treatment: 1 Active Problems Location of Pain Severity and Description of Pain Patient Has Paino No Site Locations With Dressing Change: No Pain Management and Medication Current Pain Management: Electronic Signature(s) Signed: 05/22/2016 4:54:43 PM By: Regan Lemming BSN,  RN Entered By: Regan Lemming on 05/22/2016 11:07:57 Jansson, Apolinar Junes ([id number]) -------------------------------------------------------------------------------- Wound Assessment Details [place], Akirra Date of Service: 05/22/2016 11:00 AM Patient Name: T. Patient Account Number: [id number] Medical Record Treating RN: Baruch Gouty RN, BSN, [name] [id number] Number: Other Clinician: 1950-05-16 (66 y.o. Treating ROBSON, MICHAEL Date of Birth/Sex: Female) Physician/Extender: G Primary [place], [place] Physician: Referring Physician: NCHE, [place] Weeks in Treatment: 1 Wound Status Wound Number: 1 Primary Etiology: 3rd degree Burn Wound Location: Left Gluteus Wound Status: Open Wounding Event: Thermal Burn Comorbid History: History of Burn, Osteoarthritis Date Acquired: 03/26/2016 Weeks Of Treatment: 1 Clustered Wound: No Photos Photo Uploaded By: Regan Lemming on 05/22/2016 17:12:03 Wound Measurements Length: (cm) 1.2 Width: (cm) 2 Depth: (cm) 0.1 Area: (cm) 1.885 Volume: (cm) 0.188 % Reduction in Area: 60% % Reduction in Volume: 60.1% Epithelialization: None Tunneling: No Undermining: No Wound Description Full Thickness Without Exposed Classification: Support Structures Wound Margin: Flat and Intact Exudate Medium Amount: Exudate Type: Serous Exudate Color: amber Barnette, Kyrie T. ([id number]) Foul Odor After Cleansing: No Wound Bed Granulation Amount: None Present (0%) Exposed Structure Necrotic Amount: Large (67-100%) Fascia Exposed: No Necrotic Quality: Adherent Slough Fat Layer Exposed: No Tendon Exposed: No Muscle Exposed: No Joint Exposed:  No Bone Exposed: No Limited to Skin Breakdown Periwound Skin Texture Texture Color No Abnormalities Noted: No No Abnormalities Noted: No Callus: No Atrophie Blanche: No Crepitus: No Cyanosis: No Excoriation: No Ecchymosis: No Fluctuance: No Erythema: No Friable: No Hemosiderin Staining:  No Induration: No Mottled: No Localized Edema: No Pallor: No Rash: No Rubor: No Scarring: No Temperature / Pain Moisture Temperature: No Abnormality No Abnormalities Noted: No Tenderness on Palpation: Yes Dry / Scaly: No Maceration: No Moist: Yes Wound Preparation Ulcer Cleansing: Rinsed/Irrigated with Saline Topical Anesthetic Applied: Other: lidocaine 4%, Electronic Signature(s) Signed: 05/22/2016 4:54:43 PM By: Regan Lemming BSN, RN Entered By: Regan Lemming on 05/22/2016 11:13:41 Schaper, Apolinar Junes ([id number]) -------------------------------------------------------------------------------- Vitals Details Renteria, Blondina Date of Service: 05/22/2016 11:00 AM Patient Name: T. Patient Account Number: [id number] Medical Record Treating RN: Baruch Gouty RN, BSN, [name] [id number] Number: Other Clinician: [birth year] (66 y.o. Treating ROBSON, [place] Date of Birth/Sex: Female) Physician/Extender: G [place], [place] Physician: Referring Physician: NCHE, [place] Weeks in Treatment: 1 Vital Signs Time Taken: 11:13 Temperature (F): 97.7 Height (in): 69 Pulse (bpm): 75 Weight (lbs): 245 Respiratory Rate (breaths/min): 18 Body Mass Index (BMI): 36.2 Blood Pressure (mmHg): 170/100 Reference Range: 80 - 120 mg / dl Electronic Signature(s) Signed: 05/22/2016 4:54:43 PM By: Regan Lemming BSN, RN Entered By: Regan Lemming on 05/22/2016 11:14:53
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
## 2016-05-24 ENCOUNTER — Other Ambulatory Visit: Payer: Self-pay | Admitting: Nurse Practitioner

## 2016-05-24 DIAGNOSIS — M810 Age-related osteoporosis without current pathological fracture: Secondary | ICD-10-CM

## 2016-05-24 DIAGNOSIS — Z13820 Encounter for screening for osteoporosis: Secondary | ICD-10-CM

## 2016-05-29 LAB — HM PAP SMEAR

## 2016-06-03 ENCOUNTER — Other Ambulatory Visit: Payer: Self-pay | Admitting: Nurse Practitioner

## 2016-06-03 DIAGNOSIS — E2839 Other primary ovarian failure: Secondary | ICD-10-CM

## 2016-06-04 ENCOUNTER — Ambulatory Visit
Admission: RE | Admit: 2016-06-04 | Discharge: 2016-06-04 | Disposition: A | Discharge status: Home / Self Care | Payer: Medicare Other | Source: Ambulatory Visit | Attending: Obstetrics | Admitting: Obstetrics

## 2016-06-04 ENCOUNTER — Other Ambulatory Visit: Payer: Medicare Other

## 2016-06-04 DIAGNOSIS — Z1231 Encounter for screening mammogram for malignant neoplasm of breast: Secondary | ICD-10-CM | POA: Diagnosis not present

## 2016-06-06 ENCOUNTER — Encounter (HOSPITAL_BASED_OUTPATIENT_CLINIC_OR_DEPARTMENT_OTHER): Payer: Medicare Other | Attending: Internal Medicine

## 2016-06-06 DIAGNOSIS — T2135XA Burn of third degree of buttock, initial encounter: Secondary | ICD-10-CM | POA: Diagnosis not present

## 2016-06-06 DIAGNOSIS — Z87891 Personal history of nicotine dependence: Secondary | ICD-10-CM | POA: Diagnosis not present

## 2016-06-06 DIAGNOSIS — T2135XD Burn of third degree of buttock, subsequent encounter: Secondary | ICD-10-CM | POA: Diagnosis not present

## 2016-06-06 DIAGNOSIS — I10 Essential (primary) hypertension: Secondary | ICD-10-CM | POA: Diagnosis not present

## 2016-06-06 DIAGNOSIS — L98428 Non-pressure chronic ulcer of back with other specified severity: Secondary | ICD-10-CM | POA: Diagnosis not present

## 2016-06-13 ENCOUNTER — Encounter (HOSPITAL_BASED_OUTPATIENT_CLINIC_OR_DEPARTMENT_OTHER): Payer: Medicare Other | Attending: Internal Medicine

## 2016-06-13 DIAGNOSIS — L98421 Non-pressure chronic ulcer of back limited to breakdown of skin: Secondary | ICD-10-CM | POA: Insufficient documentation

## 2016-06-13 DIAGNOSIS — Z85828 Personal history of other malignant neoplasm of skin: Secondary | ICD-10-CM | POA: Diagnosis not present

## 2016-06-13 DIAGNOSIS — I10 Essential (primary) hypertension: Secondary | ICD-10-CM | POA: Diagnosis not present

## 2016-06-13 DIAGNOSIS — L821 Other seborrheic keratosis: Secondary | ICD-10-CM | POA: Diagnosis not present

## 2016-06-13 DIAGNOSIS — D225 Melanocytic nevi of trunk: Secondary | ICD-10-CM | POA: Diagnosis not present

## 2016-06-13 DIAGNOSIS — L570 Actinic keratosis: Secondary | ICD-10-CM | POA: Diagnosis not present

## 2016-06-13 DIAGNOSIS — T2135XA Burn of third degree of buttock, initial encounter: Secondary | ICD-10-CM | POA: Diagnosis not present

## 2016-06-20 ENCOUNTER — Telehealth: Payer: Self-pay | Admitting: Nurse Practitioner

## 2016-06-20 DIAGNOSIS — T2135XA Burn of third degree of buttock, initial encounter: Secondary | ICD-10-CM | POA: Diagnosis not present

## 2016-06-20 DIAGNOSIS — L98421 Non-pressure chronic ulcer of back limited to breakdown of skin: Secondary | ICD-10-CM | POA: Diagnosis not present

## 2016-06-20 DIAGNOSIS — I10 Essential (primary) hypertension: Secondary | ICD-10-CM | POA: Diagnosis not present

## 2016-06-20 NOTE — Telephone Encounter (Signed)
Patients OV 12/28 was denied by medicare because of coding.  Can you please get billing to look at this?

## 2016-06-21 NOTE — Telephone Encounter (Signed)
Email to coding to confirm. Looks like routine diagnosis code sent with labs in error. Also, pt charged a CPE but MCR will not cover.

## 2016-06-26 ENCOUNTER — Ambulatory Visit (AMBULATORY_SURGERY_CENTER): Payer: Self-pay | Admitting: *Deleted

## 2016-06-26 VITALS — Ht 68.5 in | Wt 246.0 lb

## 2016-06-26 DIAGNOSIS — Z86010 Personal history of colonic polyps: Secondary | ICD-10-CM

## 2016-06-26 MED ORDER — NA SULFATE-K SULFATE-MG SULF 17.5-3.13-1.6 GM/177ML PO SOLN
ORAL | 0 refills | Status: DC
Start: 2016-06-26 — End: 2016-07-01

## 2016-06-26 NOTE — Progress Notes (Signed)
Patient states her BP is very high and she is to see her PCP next week about this. Explained to patient that she needs to follow up with PCP and call us back. If BP not under control she will need to cancel colonoscopy, pt aware and agrees to call [facility] back next week with this information. PV nurse to follow up. Pt information is in the PV blackbook.

## 2016-06-26 NOTE — Progress Notes (Signed)
Patient denies any allergies to eggs or soy. Patient denies any problems with anesthesia/sedation. Patient denies any oxygen use at home and does not take any diet/weight loss medications. EMMI education declined by patient.  

## 2016-06-27 DIAGNOSIS — L98429 Non-pressure chronic ulcer of back with unspecified severity: Secondary | ICD-10-CM | POA: Diagnosis not present

## 2016-06-27 DIAGNOSIS — L98421 Non-pressure chronic ulcer of back limited to breakdown of skin: Secondary | ICD-10-CM | POA: Diagnosis not present

## 2016-06-27 DIAGNOSIS — I10 Essential (primary) hypertension: Secondary | ICD-10-CM | POA: Diagnosis not present

## 2016-06-28 ENCOUNTER — Telehealth: Payer: Self-pay | Admitting: Nurse Practitioner

## 2016-06-28 NOTE — Telephone Encounter (Signed)
Patient Name: Dawn [name] DOB: Jan 29, 1951 Initial Comment BP 169/96, no symptoms. has colonoscopy next week, needs to know what the reading range should be that is safe for her to have. Nurse Assessment Nurse: Rock Nephew, RN, Juliann Pulse Date/Time (Eastern Time): 06/28/2016 11:15:17 AM Confirm and document reason for call. If symptomatic, describe symptoms. ---Caller states that her blood pressure today was 169/96 today but has been running high for several weeks ( per GI doctor she saw ) . They advised her they would not be able to do her colonoscopy next week if it remains high though they gave her no parameters. She is asymptomatic. Does the patient have any new or worsening symptoms? ---Yes Will a triage be completed? ---Yes Related visit to physician within the last 2 weeks? ---No Does the PT have any chronic conditions? (i.e. diabetes, asthma, etc.) ---No Is this a behavioral health or substance abuse call? ---No Guidelines Guideline Title Affirmed Question Affirmed Notes High Blood Pressure BP # 160/100 Final Disposition User See PCP When Office is Open (within 3 days) Rock Nephew, RN, Juliann Pulse Comments I explained to caller that my manager would be calling her back to set up an appt for her and she verbalized understanding. Pt had an appt schedule regarding her BP on 2/26 bu t requested an earlier time. Appt rescheduled for 10am on 2/19 with Dr. Elna Breslow. Referrals REFERRED TO PCP OFFICE Disagree/Comply: Comply

## 2016-06-30 NOTE — Telephone Encounter (Signed)
Pt now has Medicare, she can no longer get traditional CPE's. She scheduled and was billed a CPE on [facility] 05/09/16. I have requested that billing void this charge as it is a non covered service ($325.00). This has nothing to do with coding. She also has a lab balance of $37.00, which is patient responsibility. The labs were performed as "routine" and Medicare will not pay that either, but I cannot change as they were ordered to be preventive. This may be a good time to schedule her AWV for next year and get her on the correct path as she is new to Medicare.

## 2016-07-01 ENCOUNTER — Encounter: Payer: Self-pay | Admitting: Family

## 2016-07-01 ENCOUNTER — Ambulatory Visit (INDEPENDENT_AMBULATORY_CARE_PROVIDER_SITE_OTHER): Payer: Medicare Other | Admitting: Family

## 2016-07-01 VITALS — BP 142/98 | HR 86 | Temp 97.8°F | Ht 68.0 in | Wt 248.0 lb

## 2016-07-01 DIAGNOSIS — I10 Essential (primary) hypertension: Secondary | ICD-10-CM | POA: Diagnosis not present

## 2016-07-01 MED ORDER — AMLODIPINE BESYLATE 5 MG PO TABS: 5.0000 mg | ORAL_TABLET | Freq: Every day | ORAL | 1 refills | Status: DC

## 2016-07-01 NOTE — Assessment & Plan Note (Signed)
Hypertension uncontrolled and above goal of 140/90 with lifestyle management and noted on several office visit blood pressure readings. Denies worse headache of life with no new symptoms of end organ damage noted on physical exam. Start amlodipine. Encouraged to monitor blood pressure at home with follow-up for nurse visit in 3 days. Recommend following low-sodium diet with information on Dash eating plan provided in after visit summary.

## 2016-07-01 NOTE — Progress Notes (Signed)
Subjective:    Patient ID: Dawn [name], female    DOB: [birth year], 66 y.o.   MRN: [id number]  Chief Complaint  Patient presents with  . Hypertension    HPI:  Dawn [name] is a 66 y.o. female who  has a past medical history of Allergy; Arthritis; Burn; Family history of ischemic heart disease; and Hyperlipidemia. and presents today for an office visit.   Elevated blood pressure - Most recently noted to have readings of elevated blood pressure when she was going for an office visit prior to colonoscopy and was noted to have blood pressures of 180/120 and 170/100. She has taken her blood pressure at the [facility] and noted to be in the 160s.  Previous noted to be stable and below goal of 140/90 and not maintained on medication. Denies worst headache of life with no symptoms of end organ damage. Not currently following a low sodium diet.    BP Readings from Last 3 Encounters:  07/01/16 (!) 142/98  05/09/16 140/84  04/22/16 138/84     Allergies  Allergen Reactions  . Iohexol Swelling    Allergy is to [employer];  oysters caused swelling of anterior neck. Dr.Clinton Young could find no "markers" for shellfish allergy///NO IV CONTRAST ALLERGY//A.CALHOUN///     Review of Systems  Constitutional: Negative for chills and fever.  Eyes:       Negative for changes in vision  Respiratory: Negative for cough, chest tightness and wheezing.   Cardiovascular: Negative for chest pain, palpitations and leg swelling.  Neurological: Negative for dizziness, weakness and light-headedness.      Objective:    BP (!) 142/98   Pulse 86   Temp 97.8 F (36.6 C)   Ht '5\' 8"'  (1.727 m)   Wt 248 lb (112.5 kg)   SpO2 98%   BMI 37.71 kg/m  Nursing note and vital signs reviewed.  Physical Exam  Constitutional: She is oriented to person, place, and time. She appears well-developed and well-nourished. No distress.  Cardiovascular: Normal rate, regular rhythm, normal heart sounds and intact  distal pulses.   Pulmonary/Chest: Effort normal and breath sounds normal.  Neurological: She is alert and oriented to person, place, and time.  Skin: Skin is warm and dry.  Psychiatric: She has a normal mood and affect. Her behavior is normal. Judgment and thought content normal.       Assessment & Plan:   Problem List Items Addressed This Visit      Cardiovascular and Mediastinum   Essential hypertension - Primary    Hypertension uncontrolled and above goal of 140/90 with lifestyle management and noted on several office visit blood pressure readings. Denies worse headache of life with no new symptoms of end organ damage noted on physical exam. Start amlodipine. Encouraged to monitor blood pressure at home with follow-up for nurse visit in 3 days. Recommend following low-sodium diet with information on Dash eating plan provided in after visit summary.      Relevant Medications   amLODipine (NORVASC) 5 MG tablet       I have discontinued Ms. Kearn's Na Sulfate-K Sulfate-Mg Sulf. I am also having her start on amLODipine. Additionally, I am having her maintain her Multiple Vitamins-Minerals (MULTIVITAMIN PO), Biotin, meloxicam, acetaminophen, and SUPREP BOWEL PREP KIT.   Meds ordered this encounter  Medications  . SUPREP BOWEL PREP KIT 17.5-3.13-1.6 GM/180ML SOLN  . amLODipine (NORVASC) 5 MG tablet    Sig: Take 1 tablet (5 mg total) by mouth  daily.    Dispense:  30 tablet    Refill:  1    Order Specific Question:   Supervising Provider    Answer:   Pricilla Holm A [[id number]]     Follow-up: Return if symptoms worsen or fail to improve.  Mauricio Po, FNP

## 2016-07-01 NOTE — Patient Instructions (Signed)
Thank you for choosing [facility].  SUMMARY AND INSTRUCTIONS:  Medication:  Your prescription(s) have been submitted to your pharmacy or been printed and provided for you. Please take as directed and contact our office if you believe you are having problem(s) with the medication(s) or have any questions.  Follow up:  If your symptoms worsen or fail to improve, please contact our office for further instruction, or in case of emergency go directly to the emergency room at the closest medical facility.    DASH Eating Plan DASH stands for "Dietary Approaches to Stop Hypertension." The DASH eating plan is a healthy eating plan that has been shown to reduce high blood pressure (hypertension). Additional health benefits may include reducing the risk of type 2 diabetes mellitus, heart disease, and stroke. The DASH eating plan may also help with weight loss. What do I need to know about the DASH eating plan? For the DASH eating plan, you will follow these general guidelines:  Choose foods with less than 150 milligrams of sodium per serving (as listed on the food label).  Use salt-free seasonings or herbs instead of table salt or sea salt.  Check with your health care provider or pharmacist before using salt substitutes.  Eat lower-sodium products. These are often labeled as "low-sodium" or "no salt added."  Eat fresh foods. Avoid eating a lot of canned foods.  Eat more vegetables, fruits, and low-fat dairy products.  Choose whole grains. Look for the word "whole" as the first word in the ingredient list.  Choose fish and skinless chicken or [place] more often than red meat. Limit fish, poultry, and meat to 6 oz (170 g) each day.  Limit sweets, desserts, sugars, and sugary drinks.  Choose heart-healthy fats.  Eat more home-cooked food and less restaurant, buffet, and fast food.  Limit fried foods.  Do not fry foods. Cook foods using methods such as baking, boiling, grilling,  and broiling instead.  When eating at a restaurant, ask that your food be prepared with less salt, or no salt if possible. What foods can I eat? Seek help from a dietitian for individual calorie needs. Grains  Whole grain or whole wheat bread. Brown rice. Whole grain or whole wheat pasta. Quinoa, bulgur, and whole grain cereals. Low-sodium cereals. Corn or whole wheat flour tortillas. Whole grain cornbread. Whole grain crackers. Low-sodium crackers. Vegetables  Fresh or frozen vegetables (raw, steamed, roasted, or grilled). Low-sodium or reduced-sodium tomato and vegetable juices. Low-sodium or reduced-sodium tomato sauce and paste. Low-sodium or reduced-sodium canned vegetables. Fruits  All fresh, canned (in natural juice), or frozen fruits. Meat and Other Protein Products  Ground beef (85% or leaner), grass-fed beef, or beef trimmed of fat. Skinless chicken or [place]. Ground chicken or [place]. Pork trimmed of fat. All fish and seafood. Eggs. Dried beans, peas, or lentils. Unsalted nuts and seeds. Unsalted canned beans. Dairy  Low-fat dairy products, such as skim or 1% milk, 2% or reduced-fat cheeses, low-fat ricotta or cottage cheese, or plain low-fat yogurt. Low-sodium or reduced-sodium cheeses. Fats and Oils  Tub margarines without trans fats. Light or reduced-fat mayonnaise and salad dressings (reduced sodium). Avocado. Safflower, olive, or canola oils. Natural peanut or almond butter. Other  Unsalted popcorn and pretzels. The items listed above may not be a complete list of recommended foods or beverages. Contact your dietitian for more options.  What foods are not recommended? Grains  White bread. White pasta. White rice. Refined cornbread. Bagels and croissants. Crackers that contain trans  fat. Vegetables  Creamed or fried vegetables. Vegetables in a cheese sauce. Regular canned vegetables. Regular canned tomato sauce and paste. Regular tomato and vegetable juices. Fruits  Canned  fruit in light or heavy syrup. Fruit juice. Meat and Other Protein Products  Fatty cuts of meat. Ribs, chicken wings, bacon, sausage, bologna, salami, chitterlings, fatback, hot dogs, bratwurst, and packaged luncheon meats. Salted nuts and seeds. Canned beans with salt. Dairy  Whole or 2% milk, cream, half-and-half, and cream cheese. Whole-fat or sweetened yogurt. Full-fat cheeses or blue cheese. Nondairy creamers and whipped toppings. Processed cheese, cheese spreads, or cheese curds. Condiments  Onion and garlic salt, seasoned salt, table salt, and sea salt. Canned and packaged gravies. Worcestershire sauce. Tartar sauce. Barbecue sauce. Teriyaki sauce. Soy sauce, including reduced sodium. Steak sauce. Fish sauce. Oyster sauce. Cocktail sauce. Horseradish. Ketchup and mustard. Meat flavorings and tenderizers. Bouillon cubes. Hot sauce. Tabasco sauce. Marinades. Taco seasonings. Relishes. Fats and Oils  Butter, stick margarine, lard, shortening, ghee, and bacon fat. Coconut, palm kernel, or palm oils. Regular salad dressings. Other  Pickles and olives. Salted popcorn and pretzels. The items listed above may not be a complete list of foods and beverages to avoid. Contact your dietitian for more information.  Where can I find more information? National Heart, Lung, and Blood Institute: travelstabloid.com This information is not intended to replace advice given to you by your health care provider. Make sure you discuss any questions you have with your health care provider. Document Released: 04/18/2011 Document Revised: 10/05/2015 Document Reviewed: 03/03/2013 Elsevier Interactive Patient Education  2017 [employer]vier Inc.   Hypertension Hypertension, commonly called high blood pressure, is when the force of blood pumping through your arteries is too strong. Your arteries are the blood vessels that carry blood from your heart throughout your body. A blood pressure  reading consists of a higher number over a lower number, such as 110/72. The higher number (systolic) is the pressure inside your arteries when your heart pumps. The lower number (diastolic) is the pressure inside your arteries when your heart relaxes. Ideally you want your blood pressure below 120/80. Hypertension forces your heart to work harder to pump blood. Your arteries may become narrow or stiff. Having untreated or uncontrolled hypertension can cause heart attack, stroke, kidney disease, and other problems. What increases the risk? Some risk factors for high blood pressure are controllable. Others are not. Risk factors you cannot control include:  Race. You may be at higher risk if you are African American.  Age. Risk increases with age.  Gender. Men are at higher risk than women before age 26 years. After age 25, women are at higher risk than men. Risk factors you can control include:  Not getting enough exercise or physical activity.  Being overweight.  Getting too much fat, sugar, calories, or salt in your diet.  Drinking too much alcohol. What are the signs or symptoms? Hypertension does not usually cause signs or symptoms. Extremely high blood pressure (hypertensive crisis) may cause headache, anxiety, shortness of breath, and nosebleed. How is this diagnosed? To check if you have hypertension, your health care provider will measure your blood pressure while you are seated, with your arm held at the level of your heart. It should be measured at least twice using the same arm. Certain conditions can cause a difference in blood pressure between your right and left arms. A blood pressure reading that is higher than normal on one occasion does not mean that you need treatment.  If it is not clear whether you have high blood pressure, you may be asked to return on a different day to have your blood pressure checked again. Or, you may be asked to monitor your blood pressure at home for 1  or more weeks. How is this treated? Treating high blood pressure includes making lifestyle changes and possibly taking medicine. Living a healthy lifestyle can help lower high blood pressure. You may need to change some of your habits. Lifestyle changes may include:  Following the DASH diet. This diet is high in fruits, vegetables, and whole grains. It is low in salt, red meat, and added sugars.  Keep your sodium intake below 2,300 mg per day.  Getting at least 30-45 minutes of aerobic exercise at least 4 times per week.  Losing weight if necessary.  Not smoking.  Limiting alcoholic beverages.  Learning ways to reduce stress. Your health care provider may prescribe medicine if lifestyle changes are not enough to get your blood pressure under control, and if one of the following is true:  You are 85-44 years of age and your systolic blood pressure is above 140.  You are 4 years of age or older, and your systolic blood pressure is above 150.  Your diastolic blood pressure is above 90.  You have diabetes, and your systolic blood pressure is over [id number] or your diastolic blood pressure is over 90.  You have kidney disease and your blood pressure is above 140/90.  You have heart disease and your blood pressure is above 140/90. Your personal target blood pressure may vary depending on your medical conditions, your age, and other factors. Follow these instructions at home:  Have your blood pressure rechecked as directed by your health care provider.  Take medicines only as directed by your health care provider. Follow the directions carefully. Blood pressure medicines must be taken as prescribed. The medicine does not work as well when you skip doses. Skipping doses also puts you at risk for problems.  Do not smoke.  Monitor your blood pressure at home as directed by your health care provider. Contact a health care provider if:  You think you are having a reaction to medicines  taken.  You have recurrent headaches or feel dizzy.  You have swelling in your ankles.  You have trouble with your vision. Get help right away if:  You develop a severe headache or confusion.  You have unusual weakness, numbness, or feel faint.  You have severe chest or abdominal pain.  You vomit repeatedly.  You have trouble breathing. This information is not intended to replace advice given to you by your health care provider. Make sure you discuss any questions you have with your health care provider. Document Released: 04/29/2005 Document Revised: 10/05/2015 Document Reviewed: 02/19/2013 Elsevier Interactive Patient Education  2017 [employer].

## 2016-07-01 NOTE — Telephone Encounter (Signed)
Patient called back in regard to this.  Can you get with me to look at this to make sure it was resent?

## 2016-07-01 NOTE — Progress Notes (Signed)
Pre visit review using our clinic review tool, if applicable. No additional management support is needed unless otherwise documented below in the visit note. 

## 2016-07-05 ENCOUNTER — Ambulatory Visit: Payer: Medicare Other

## 2016-07-05 ENCOUNTER — Telehealth: Payer: Self-pay

## 2016-07-05 VITALS — BP 162/94

## 2016-07-05 DIAGNOSIS — I10 Essential (primary) hypertension: Secondary | ICD-10-CM

## 2016-07-05 MED ORDER — AMLODIPINE BESYLATE 10 MG PO TABS: 10.0000 mg | ORAL_TABLET | Freq: Every day | ORAL | 0 refills | Status: DC

## 2016-07-05 NOTE — Telephone Encounter (Signed)
Pt came in for BP check 162/94 was the reading. Please advise

## 2016-07-08 ENCOUNTER — Ambulatory Visit: Payer: Medicare Other | Admitting: Nurse Practitioner

## 2016-07-08 NOTE — Telephone Encounter (Signed)
Please have her increase to 10 mg of amlodipine.

## 2016-07-10 ENCOUNTER — Encounter: Payer: Medicare Other | Admitting: Gastroenterology

## 2016-07-11 ENCOUNTER — Encounter: Payer: Medicare Other | Admitting: Gastroenterology

## 2016-07-11 ENCOUNTER — Ambulatory Visit: Payer: Medicare Other | Admitting: General Practice

## 2016-07-11 VITALS — BP 128/80

## 2016-07-11 DIAGNOSIS — I10 Essential (primary) hypertension: Secondary | ICD-10-CM

## 2016-07-12 ENCOUNTER — Telehealth: Payer: Self-pay | Admitting: Nurse Practitioner

## 2016-07-12 NOTE — Telephone Encounter (Signed)
Left message for pt in regard.

## 2016-07-12 NOTE — Telephone Encounter (Signed)
The test was for;  For elevated liver enzymes, hepatitis B and C testing is needed. I will also recommend CT ABD to evaluate for fatty liver.  Pt agree to all test but she wants to check with her insurance first (last thing we spoke).

## 2016-07-12 NOTE — Telephone Encounter (Signed)
Pt called stating that she received a call about some tests that she needed to have done. She said that she has not been able to get them done because of a hematoma on her leg. She is transferring care to another physician and wants to know if she can wait to have them done. I can not figure out who called her. Please advise. Thanks [employer]

## 2016-07-12 NOTE — Telephone Encounter (Signed)
Ok, let me know.

## 2016-07-12 NOTE — Telephone Encounter (Signed)
Transaction inquiry shows this is now correct. The $325.00 for the CPE has been voided and her only patient balance if the $37.00 left from labs.

## 2016-07-15 ENCOUNTER — Encounter: Payer: Medicare Other | Admitting: Gastroenterology

## 2016-07-15 NOTE — Telephone Encounter (Signed)
Tried to call pt couple times, phone doesn't work. Will try again later.

## 2016-07-15 NOTE — Telephone Encounter (Signed)
Left vm for pt to callback 

## 2016-07-16 NOTE — Telephone Encounter (Signed)
Spoke with pt, she is not ready to do this yet.

## 2016-07-16 NOTE — Progress Notes (Signed)
Maintain current medication 

## 2016-08-16 ENCOUNTER — Other Ambulatory Visit: Payer: Self-pay | Admitting: Family

## 2016-08-16 NOTE — Telephone Encounter (Signed)
Please advise, Marya Amsler sent this med in for pt in 06/2016 but there is 2 different dosage.

## 2016-08-19 ENCOUNTER — Telehealth: Payer: Self-pay

## 2016-08-19 ENCOUNTER — Telehealth: Payer: Self-pay | Admitting: Gastroenterology

## 2016-08-19 DIAGNOSIS — M25561 Pain in right knee: Secondary | ICD-10-CM | POA: Diagnosis not present

## 2016-08-19 DIAGNOSIS — M170 Bilateral primary osteoarthritis of knee: Secondary | ICD-10-CM | POA: Diagnosis not present

## 2016-08-19 DIAGNOSIS — M25562 Pain in left knee: Secondary | ICD-10-CM | POA: Diagnosis not present

## 2016-08-19 NOTE — Telephone Encounter (Signed)
Pt. States she has a headache and wonders if she can take Tylenol.   Advised pt. That would be fine, up until the time she is scheduled to be NPO in the morning.

## 2016-08-20 ENCOUNTER — Ambulatory Visit (AMBULATORY_SURGERY_CENTER): Payer: Medicare Other | Admitting: Gastroenterology

## 2016-08-20 ENCOUNTER — Encounter: Payer: Self-pay | Admitting: Gastroenterology

## 2016-08-20 VITALS — BP 117/69 | HR 72 | Temp 98.7°F | Resp 20 | Ht 68.5 in | Wt 246.0 lb

## 2016-08-20 DIAGNOSIS — D123 Benign neoplasm of transverse colon: Secondary | ICD-10-CM

## 2016-08-20 DIAGNOSIS — Z86010 Personal history of colonic polyps: Secondary | ICD-10-CM | POA: Diagnosis present

## 2016-08-20 MED ORDER — SODIUM CHLORIDE 0.9 % IV SOLN: 500.0000 mL | INTRAVENOUS | Status: DC

## 2016-08-20 NOTE — Op Note (Signed)
[facility] Patient Name: Dawn [name] Procedure Date: 08/20/2016 3:42 PM MRN: [id number] Endoscopist: Mallie Mussel L. Loletha Carrow , MD Age: 66 Referring MD:  Date of Birth: [birth year] Gender: Female Account #: [id number] Procedure:                Colonoscopy Indications:              Surveillance: Personal history of adenomatous                            polyps on last colonoscopy > 5 years ago (TA < 1cm                            in 2012) Medicines:                Monitored Anesthesia Care Procedure:                Pre-Anesthesia Assessment:                           - Prior to the procedure, a History and Physical                            was performed, and patient medications and                            allergies were reviewed. The patient's tolerance of                            previous anesthesia was also reviewed. The risks                            and benefits of the procedure and the sedation                            options and risks were discussed with the patient.                            All questions were answered, and informed consent                            was obtained. Prior Anticoagulants: The patient has                            taken no previous anticoagulant or antiplatelet                            agents. ASA Grade Assessment: II - A patient with                            mild systemic disease. After reviewing the risks                            and benefits, the patient was deemed in  satisfactory condition to undergo the procedure.                           After obtaining informed consent, the colonoscope                            was passed under direct vision. Throughout the                            procedure, the patient's blood pressure, pulse, and                            oxygen saturations were monitored continuously. The                            Colonoscope was introduced through the anus and                           advanced to the the cecum, identified by                            appendiceal orifice and ileocecal valve. The                            colonoscopy was performed without difficulty. The                            patient tolerated the procedure well. The quality                            of the bowel preparation was good. The ileocecal                            valve, appendiceal orifice, and rectum were                            photographed. The quality of the bowel preparation                            was evaluated using the BBPS [facility] Bowel                            Preparation Scale) with scores of: Right Colon = 2,                            Transverse Colon = 2 and Left Colon = 2. The total                            BBPS score equals 6. The bowel preparation used was                            SUPREP. Scope In: 3:59:39 PM Scope Out: 4:13:38 PM Scope Withdrawal Time: 0 hours 10 minutes 51 seconds  Total Procedure  Duration: 0 hours 13 minutes 59 seconds  Findings:                 The perianal and digital rectal examinations were                            normal.                           Multiple medium-mouthed diverticula were found in                            the left colon.                           A 2 mm polyp was found in the distal transverse                            colon. The polyp was sessile. The polyp was removed                            with a piecemeal technique using a cold biopsy                            forceps. Resection and retrieval were complete.                           Retroflexion in the rectum was not performed due to                            anatomy.                           The exam was otherwise without abnormality. Complications:            No immediate complications. Estimated Blood Loss:     Estimated blood loss: none. Impression:               - Diverticulosis in the left colon.                            - One 2 mm polyp in the distal transverse colon,                            removed piecemeal using a cold biopsy forceps.                            Resected and retrieved.                           - The examination was otherwise normal. Recommendation:           - Patient has a contact number available for                            emergencies. The signs and symptoms of potential  delayed complications were discussed with the                            patient. Return to normal activities tomorrow.                            Written discharge instructions were provided to the                            patient.                           - Resume previous diet.                           - Continue present medications.                           - Await pathology results.                           - Repeat colonoscopy is recommended for                            surveillance. The colonoscopy date will be                            determined after pathology results from today's                            exam become available for review. Donda Friedli L. Loletha Carrow, MD 08/20/2016 4:18:53 PM This report has been signed electronically.

## 2016-08-20 NOTE — Patient Instructions (Signed)
YOU HAD AN ENDOSCOPIC PROCEDURE TODAY AT [facility]:   Refer to the procedure report that was given to you for any specific questions about what was found during the examination.  If the procedure report does not answer your questions, please call your gastroenterologist to clarify.  If you requested that your care partner not be given the details of your procedure findings, then the procedure report has been included in a sealed envelope for you to review at your convenience later.  YOU SHOULD EXPECT: Some feelings of bloating in the abdomen. Passage of more gas than usual.  Walking can help get rid of the air that was put into your GI tract during the procedure and reduce the bloating. If you had a lower endoscopy (such as a colonoscopy or flexible sigmoidoscopy) you may notice spotting of blood in your stool or on the toilet paper. If you underwent a bowel prep for your procedure, you may not have a normal bowel movement for a few days.  Please Note:  You might notice some irritation and congestion in your nose or some drainage.  This is from the oxygen used during your procedure.  There is no need for concern and it should clear up in a day or so.  SYMPTOMS TO REPORT IMMEDIATELY:   Following lower endoscopy (colonoscopy or flexible sigmoidoscopy):  Excessive amounts of blood in the stool  Significant tenderness or worsening of abdominal pains  Swelling of the abdomen that is new, acute  Fever of 100F or higher   For urgent or emergent issues, a gastroenterologist can be reached at any hour by calling [phone]. Please read all handouts given to you by your recovery nurse today.   DIET:  We do recommend a small meal at first, but then you may proceed to your regular diet.  Drink plenty of fluids but you should avoid alcoholic beverages for 24 hours.  ACTIVITY:  You should plan to take it easy for the rest of today and you should NOT DRIVE or use heavy machinery until  tomorrow (because of the sedation medicines used during the test).    FOLLOW UP: Our staff will call the number listed on your records the next business day following your procedure to check on you and address any questions or concerns that you may have regarding the information given to you following your procedure. If we do not reach you, we will leave a message.  However, if you are feeling well and you are not experiencing any problems, there is no need to return our call.  We will assume that you have returned to your regular daily activities without incident.  If any biopsies were taken you will be contacted by phone or by letter within the next 1-3 weeks.  Please call us at [phone] if you have not heard about the biopsies in 3 weeks.    Thank you for letting us take care of your healthcare needs today.

## 2016-08-20 NOTE — Progress Notes (Signed)
A/ox3 pleased with MAC, report to Sarah RN 

## 2016-08-20 NOTE — Progress Notes (Signed)
Called to room to assist during endoscopic procedure.  Patient ID and intended procedure confirmed with present staff. Received instructions for my participation in the procedure from the performing physician.  

## 2016-08-20 NOTE — Progress Notes (Signed)
Spontaneous respirations throughout. VSS. Resting comfortably. To PACU on room air. Report to  Sara RN. 

## 2016-08-21 ENCOUNTER — Telehealth: Payer: Self-pay

## 2016-08-21 ENCOUNTER — Telehealth: Payer: Self-pay | Admitting: *Deleted

## 2016-08-21 NOTE — Telephone Encounter (Signed)
  Follow up Call-  Call back number 08/20/2016  Post procedure Call Back phone  # [phone]  Permission to leave phone message Yes  Some recent data might be hidden     Patient questions:  Do you have a fever, pain , or abdominal swelling? No. Pain Score  0 *  Have you tolerated food without any problems? Yes.    Have you been able to return to your normal activities? Yes.    Do you have any questions about your discharge instructions: Diet   No. Medications  No. Follow up visit  No.  Do you have questions or concerns about your Care? No.  Actions: * If pain score is 4 or above: No action needed, pain <4.

## 2016-08-21 NOTE — Telephone Encounter (Signed)
  Follow up Call-  Call back number 08/20/2016  Post procedure Call Back phone  # [phone]  Permission to leave phone message Yes  Some recent data might be hidden     No answer, "mail box full"  Unable to leave message. We will call back later today.

## 2016-08-23 ENCOUNTER — Encounter: Payer: Self-pay | Admitting: *Deleted

## 2016-08-23 ENCOUNTER — Encounter: Payer: Self-pay | Admitting: Gastroenterology

## 2016-09-06 DIAGNOSIS — M25561 Pain in right knee: Secondary | ICD-10-CM | POA: Diagnosis not present

## 2016-09-06 DIAGNOSIS — M170 Bilateral primary osteoarthritis of knee: Secondary | ICD-10-CM | POA: Diagnosis not present

## 2016-09-12 DIAGNOSIS — N950 Postmenopausal bleeding: Secondary | ICD-10-CM | POA: Diagnosis not present

## 2016-09-12 DIAGNOSIS — N760 Acute vaginitis: Secondary | ICD-10-CM | POA: Diagnosis not present

## 2016-09-12 DIAGNOSIS — N952 Postmenopausal atrophic vaginitis: Secondary | ICD-10-CM | POA: Diagnosis not present

## 2016-09-12 DIAGNOSIS — Z124 Encounter for screening for malignant neoplasm of cervix: Secondary | ICD-10-CM | POA: Diagnosis not present

## 2016-09-23 DIAGNOSIS — N950 Postmenopausal bleeding: Secondary | ICD-10-CM | POA: Diagnosis not present

## 2016-10-16 DIAGNOSIS — M1712 Unilateral primary osteoarthritis, left knee: Secondary | ICD-10-CM | POA: Diagnosis not present

## 2016-11-04 ENCOUNTER — Telehealth: Payer: Self-pay | Admitting: Nurse Practitioner

## 2016-11-04 NOTE — Telephone Encounter (Signed)
Need office visit

## 2016-11-04 NOTE — Telephone Encounter (Signed)
Left patient vm to keep appt on Thursday.

## 2016-11-04 NOTE — Telephone Encounter (Signed)
Please advise, last ov with [facility] 04/2016 but saw Marya Amsler 06/2016.

## 2016-11-04 NOTE — Telephone Encounter (Signed)
Patient has dropped off surgery clearance from [facility] for a knee replacement.  Surgery is scheduled in two weeks.  Patient is going to schedule appointment in case she needs it.  If this can be signed without her being seen, please call patient back to cancel appointment.  Please follow up in regard. I have placed clearance form in box.
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
## 2016-11-07 ENCOUNTER — Ambulatory Visit (INDEPENDENT_AMBULATORY_CARE_PROVIDER_SITE_OTHER): Payer: Medicare Other | Admitting: Nurse Practitioner

## 2016-11-07 ENCOUNTER — Encounter: Payer: Self-pay | Admitting: Nurse Practitioner

## 2016-11-07 VITALS — BP 120/70 | HR 85 | Temp 98.1°F | Ht 68.5 in | Wt 241.0 lb

## 2016-11-07 DIAGNOSIS — Z01818 Encounter for other preprocedural examination: Secondary | ICD-10-CM | POA: Diagnosis not present

## 2016-11-07 DIAGNOSIS — I10 Essential (primary) hypertension: Secondary | ICD-10-CM

## 2016-11-07 MED ORDER — AMLODIPINE BESYLATE 10 MG PO TABS: 10.0000 mg | ORAL_TABLET | Freq: Every day | ORAL | 1 refills | Status: DC

## 2016-11-07 NOTE — Progress Notes (Signed)
Subjective:  Patient ID: Dawn [name], female    DOB: [birth year]  Age: 66 y.o. MRN: [id number]  CC: Follow-up (knee surgery on 11/19/2016--need clearance)   HPI  HTN: stable with amlodipine. Has chronic LE edema with prolonged sitting, which improves with elevation. Has not started using compression stocking.  Upcoming Left knee replacement:  Scheduled for 11/19/2016. She denies any acute complains today. Has been doing upper and lower body strengthening exercises in preparation for surgery and rehab.  Last ECG done 2012: NSR with no ST interval or T-wave abnormality.  Outpatient Medications Prior to Visit  Medication Sig Dispense Refill  . meloxicam (MOBIC) 15 MG tablet Take 15 mg by mouth daily.     Marland Kitchen amLODipine (NORVASC) 10 MG tablet TAKE 1 TABLET BY MOUTH EVERY DAY 90 tablet 1  . acetaminophen (TYLENOL) 500 MG tablet Take 500 mg by mouth daily.    . Biotin 300 MCG TABS Take by mouth daily.    . Multiple Vitamins-Minerals (MULTIVITAMIN PO) Take by mouth daily.     Facility-Administered Medications Prior to Visit  Medication Dose Route Frequency Provider Last Rate Last Dose  . 0.9 %  sodium chloride infusion  500 mL Intravenous Continuous Danis, Estill Cotta III, MD        ROS Review of Systems  Constitutional: Negative for chills, fever and malaise/fatigue.  HENT: Negative.   Eyes: Negative for blurred vision.  Respiratory: Negative.   Cardiovascular: Negative.   Gastrointestinal: Negative.   Genitourinary: Negative.   Musculoskeletal: Positive for joint pain. Negative for falls and myalgias.  Skin: Negative.   Neurological: Negative for dizziness, sensory change, focal weakness, weakness and headaches.  Psychiatric/Behavioral: Negative for depression and substance abuse. The patient is not nervous/anxious and does not have insomnia.     Objective:  BP 120/70   Pulse 85   Temp 98.1 F (36.7 C)   Ht 5' 8.5" (1.74 m)   Wt 241 lb (109.3 kg)   SpO2 98%   BMI  36.11 kg/m   BP Readings from Last 3 Encounters:  11/07/16 120/70  08/20/16 117/69  07/11/16 128/80    Wt Readings from Last 3 Encounters:  11/07/16 241 lb (109.3 kg)  08/20/16 246 lb (111.6 kg)  07/01/16 248 lb (112.5 kg)    Physical Exam  Constitutional: She is oriented to person, place, and time. No distress.  HENT:  Right Ear: External ear normal.  Left Ear: External ear normal.  Nose: Nose normal.  Mouth/Throat: Oropharynx is clear and moist. No oropharyngeal exudate.  Eyes: No scleral icterus.  Neck: Normal range of motion. Neck supple.  Cardiovascular: Normal rate, regular rhythm and normal heart sounds.   No murmur heard. Pulmonary/Chest: Effort normal and breath sounds normal. No respiratory distress.  Abdominal: Soft. She exhibits no distension.  Musculoskeletal: She exhibits edema. She exhibits no tenderness.  Lymphadenopathy:    She has no cervical adenopathy.  Neurological: She is alert and oriented to person, place, and time.  Skin: Skin is warm and dry.  Psychiatric: She has a normal mood and affect. Her behavior is normal.  Vitals reviewed.   Lab Results  Component Value Date   WBC 5.0 07/31/2011   HGB 14.8 07/31/2011   HCT 43.9 07/31/2011   PLT 277.0 07/31/2011   GLUCOSE 99 05/09/2016   CHOL 206 (H) 05/09/2016   TRIG 135.0 05/09/2016   HDL 59.20 05/09/2016   LDLCALC 120 (H) 05/09/2016   ALT 64 (H) 05/09/2016   AST 40 (  H) 05/09/2016   NA 143 05/09/2016   K 4.4 05/09/2016   CL 109 05/09/2016   CREATININE 0.72 05/09/2016   BUN 19 05/09/2016   CO2 26 05/09/2016   TSH 1.34 08/16/2008    Mm Digital Screening Bilateral  Result Date: 06/04/2016 CLINICAL DATA:  Screening. EXAM: DIGITAL SCREENING BILATERAL MAMMOGRAM WITH CAD COMPARISON:  Previous exam(s). ACR Breast Density Category b: There are scattered areas of fibroglandular density. FINDINGS: There are no findings suspicious for malignancy. Images were processed with CAD. IMPRESSION: No  mammographic evidence of malignancy. A result letter of this screening mammogram will be mailed directly to the patient. RECOMMENDATION: Screening mammogram in one year. (Code:SM-B-01Y) BI-RADS CATEGORY  1: Negative. Electronically Signed   By: Lovey Newcomer M.D.   On: 06/04/2016 11:10    Assessment & Plan:   Dawn [name] was seen today for follow-up.  Diagnoses and all orders for this visit:  Pre-op evaluation  Essential hypertension -     amLODipine (NORVASC) 10 MG tablet; Take 1 tablet (10 mg total) by mouth at bedtime.   I have changed Ms. Dawn [name]'s amLODipine. I am also having her maintain her Multiple Vitamins-Minerals (MULTIVITAMIN PO), Biotin, meloxicam, and acetaminophen. We will continue to administer sodium chloride.  Meds ordered this encounter  Medications  . amLODipine (NORVASC) 10 MG tablet    Sig: Take 1 tablet (10 mg total) by mouth at bedtime.    Dispense:  90 tablet    Refill:  1    Order Specific Question:   Supervising Provider    Answer:   Cassandria Anger [[id number]]    Follow-up: Return in about 6 months (around 05/09/2017) for CPE, HTN and edema (need labs, fasting).  Wilfred Lacy, NP

## 2016-11-07 NOTE — Patient Instructions (Addendum)
Wear compression stocking during the day and off at night.  Maintain low salt diet.  Pre-op clearance letter was faxed to [facility].

## 2016-11-12 ENCOUNTER — Encounter: Payer: Self-pay | Admitting: Internal Medicine

## 2016-11-12 ENCOUNTER — Other Ambulatory Visit (HOSPITAL_COMMUNITY): Payer: Self-pay | Admitting: Emergency Medicine

## 2016-11-12 ENCOUNTER — Ambulatory Visit (INDEPENDENT_AMBULATORY_CARE_PROVIDER_SITE_OTHER): Payer: Medicare Other | Admitting: Internal Medicine

## 2016-11-12 VITALS — BP 140/80 | HR 89 | Temp 98.9°F | Resp 16 | Ht 68.5 in | Wt 246.0 lb

## 2016-11-12 DIAGNOSIS — R42 Dizziness and giddiness: Secondary | ICD-10-CM | POA: Diagnosis not present

## 2016-11-12 MED ORDER — MECLIZINE HCL 12.5 MG PO TABS: 12.5000 mg | ORAL_TABLET | Freq: Three times a day (TID) | ORAL | 0 refills | Status: DC | PRN

## 2016-11-12 NOTE — Progress Notes (Signed)
LOV/Surgical clearance Nche NP 11-07-16 epic/ chart

## 2016-11-12 NOTE — Progress Notes (Signed)
Subjective:  Patient ID: Dawn [name], female    DOB: Dec 11, 1950  Age: 66 y.o. MRN: [id number]  CC: Dizziness   HPI Dawn [name] presents for an episode of dizziness and vertigo that occurred 3 days ago. The symptoms lasted for a minute, she laid down and the symptoms went away and have not returned. She had the same thing happen 3 weeks ago and then again 13 years ago. Both of the prior episodes resolve spontaneously without treatment. She did not experience headache, changes in her vision or hearing, nausea, vomiting, numbness, weakness, tingling, or ataxia.  Outpatient Medications Prior to Visit  Medication Sig Dispense Refill  . acetaminophen (TYLENOL) 500 MG tablet Take 500-1,000 mg by mouth 2 (two) times daily as needed for mild pain or moderate pain.     Marland Kitchen amLODipine (NORVASC) 10 MG tablet Take 1 tablet (10 mg total) by mouth at bedtime. 90 tablet 1  . Biotin 300 MCG TABS Take 1 tablet by mouth daily.     . meloxicam (MOBIC) 15 MG tablet Take 15 mg by mouth daily.     . Multiple Vitamins-Minerals (MULTIVITAMIN PO) Take 1 tablet by mouth daily.      Facility-Administered Medications Prior to Visit  Medication Dose Route Frequency Provider Last Rate Last Dose  . 0.9 %  sodium chloride infusion  500 mL Intravenous Continuous Danis, Estill Cotta III, MD        ROS Review of Systems  Constitutional: Negative.   HENT: Negative.   Eyes: Negative for pain and visual disturbance.  Respiratory: Negative.  Negative for cough.   Cardiovascular: Negative.  Negative for chest pain.  Gastrointestinal: Negative.  Negative for abdominal pain, constipation, diarrhea, nausea and vomiting.  Endocrine: Negative.   Genitourinary: Negative.  Negative for difficulty urinating.  Musculoskeletal: Negative.  Negative for neck pain.  Skin: Negative.   Allergic/Immunologic: Negative.   Neurological: Positive for dizziness. Negative for tremors, seizures, syncope, speech difficulty, weakness,  light-headedness, numbness and headaches.  Hematological: Negative for adenopathy. Does not bruise/bleed easily.  Psychiatric/Behavioral: Negative.     Objective:  BP 140/80 (BP Location: Left Arm, Patient Position: Sitting, Cuff Size: Large)   Pulse 89   Temp 98.9 F (37.2 C) (Oral)   Resp 16   Ht 5' 8.5" (1.74 m)   Wt 246 lb (111.6 kg)   SpO2 98%   BMI 36.86 kg/m   BP Readings from Last 3 Encounters:  11/14/16 (!) 158/100  11/12/16 140/80  11/07/16 120/70    Wt Readings from Last 3 Encounters:  11/14/16 243 lb 12.8 oz (110.6 kg)  11/12/16 246 lb (111.6 kg)  11/07/16 241 lb (109.3 kg)    Physical Exam  Constitutional: She is oriented to person, place, and time. No distress.  HENT:  Head: Normocephalic and atraumatic.  Mouth/Throat: Oropharynx is clear and moist. No oropharyngeal exudate.  Eyes: Conjunctivae and EOM are normal. Pupils are equal, round, and reactive to light. Right eye exhibits no discharge. Left eye exhibits no discharge. No scleral icterus.  Neck: Normal range of motion. Neck supple. No JVD present. No thyromegaly present.  Cardiovascular: Normal rate, regular rhythm and intact distal pulses.  Exam reveals no gallop.   No murmur heard. Pulmonary/Chest: Effort normal and breath sounds normal. No respiratory distress. She has no wheezes. She has no rales. She exhibits no tenderness.  Abdominal: Soft. Bowel sounds are normal. She exhibits no distension and no mass. There is no tenderness. There is no rebound and  no guarding.  Musculoskeletal: Normal range of motion. She exhibits no edema, tenderness or deformity.  Lymphadenopathy:    She has no cervical adenopathy.  Neurological: She is alert and oriented to person, place, and time. She displays no atrophy, no tremor and normal reflexes. No cranial nerve deficit or sensory deficit. She exhibits normal muscle tone. She displays no seizure activity. Coordination and gait normal. She displays no Babinski's sign  on the right side. She displays no Babinski's sign on the left side.  Reflex Scores:      Tricep reflexes are 1+ on the right side and 1+ on the left side.      Bicep reflexes are 1+ on the right side and 1+ on the left side.      Brachioradialis reflexes are 1+ on the right side and 1+ on the left side.      Patellar reflexes are 1+ on the right side and 1+ on the left side.      Achilles reflexes are 1+ on the right side and 1+ on the left side. Skin: Skin is warm and dry. No rash noted. She is not diaphoretic. No erythema. No pallor.  Psychiatric: She has a normal mood and affect. Her behavior is normal. Judgment and thought content normal.  Vitals reviewed.   Lab Results  Component Value Date   WBC 5.6 11/14/2016   HGB 14.5 11/14/2016   HCT 43.3 11/14/2016   PLT 269 11/14/2016   GLUCOSE 116 (H) 11/14/2016   CHOL 206 (H) 05/09/2016   TRIG 135.0 05/09/2016   HDL 59.20 05/09/2016   LDLCALC 120 (H) 05/09/2016   ALT 64 (H) 05/09/2016   AST 40 (H) 05/09/2016   NA 141 11/14/2016   K 4.5 11/14/2016   CL 106 11/14/2016   CREATININE 0.82 11/14/2016   BUN 19 11/14/2016   CO2 26 11/14/2016   TSH 1.34 08/16/2008    Mm Digital Screening Bilateral  Result Date: 06/04/2016 CLINICAL DATA:  Screening. EXAM: DIGITAL SCREENING BILATERAL MAMMOGRAM WITH CAD COMPARISON:  Previous exam(s). ACR Breast Density Category b: There are scattered areas of fibroglandular density. FINDINGS: There are no findings suspicious for malignancy. Images were processed with CAD. IMPRESSION: No mammographic evidence of malignancy. A result letter of this screening mammogram will be mailed directly to the patient. RECOMMENDATION: Screening mammogram in one year. (Code:SM-B-01Y) BI-RADS CATEGORY  1: Negative. Electronically Signed   By: Lovey Newcomer M.D.   On: 06/04/2016 11:10    Assessment & Plan:   Dawn [name] was seen today for dizziness.  Diagnoses and all orders for this visit:  Vertigo- her symptoms were brief,  benign, and have resolved. Her exam is normal. Her history, symptoms, and exam are consistent with intermittent vertigo. In the event that the symptoms return she will take meclizine as needed. She'll also let me know if she has any new or worsening symptoms. -     meclizine (ANTIVERT) 12.5 MG tablet; Take 1 tablet (12.5 mg total) by mouth 3 (three) times daily as needed for dizziness.   I am having Dawn [name] start on meclizine. I am also having her maintain her Multiple Vitamins-Minerals (MULTIVITAMIN PO), Biotin, meloxicam, acetaminophen, and amLODipine. We will continue to administer sodium chloride.  Meds ordered this encounter  Medications  . meclizine (ANTIVERT) 12.5 MG tablet    Sig: Take 1 tablet (12.5 mg total) by mouth 3 (three) times daily as needed for dizziness.    Dispense:  30 tablet    Refill:  0     Follow-up: Return if symptoms worsen or fail to improve.  Scarlette Calico, MD

## 2016-11-12 NOTE — Patient Instructions (Addendum)
Dawn [name]  11/12/2016   Your procedure is scheduled on: 11-19-16  Report to [facility] Main  Entrance  Report to admitting at 730AM   Call this number if you have problems the morning of surgery [phone]   Remember: ONLY 1 PERSON MAY GO WITH YOU TO SHORT STAY TO GET  READY MORNING OF YOUR SURGERY.  Do not eat food or drink liquids :After Midnight.     Take these medicines the morning of surgery with A SIP OF WATER: tylenol as needed                                You may not have any metal on your body including hair pins and              piercings  Do not wear jewelry, make-up, lotions, powders or perfumes, deodorant             Do not wear nail polish.  Do not shave  48 hours prior to surgery.               Do not bring valuables to the hospital. [place].  Contacts, dentures or bridgework may not be worn into surgery.  Leave suitcase in the car. After surgery it may be brought to your room.                Please read over the following fact sheets you were given: _____________________________________________________________________            [facility] - Preparing for Surgery Before surgery, you can play an important role.  Because skin is not sterile, your skin needs to be as free of germs as possible.  You can reduce the number of germs on your skin by washing with CHG (chlorahexidine gluconate) soap before surgery.  CHG is an antiseptic cleaner which kills germs and bonds with the skin to continue killing germs even after washing. Please DO NOT use if you have an allergy to CHG or antibacterial soaps.  If your skin becomes reddened/irritated stop using the CHG and inform your nurse when you arrive at Short Stay. Do not shave (including legs and underarms) for at least 48 hours prior to the first CHG shower.  You may shave your face/neck. Please follow these instructions carefully:  1.   Shower with CHG Soap the night before surgery and the  morning of Surgery.  2.  If you choose to wash your hair, wash your hair first as usual with your  normal  shampoo.  3.  After you shampoo, rinse your hair and body thoroughly to remove the  shampoo.                           4.  Use CHG as you would any other liquid soap.  You can apply chg directly  to the skin and wash                       Gently with a scrungie or clean washcloth.  5.  Apply the CHG Soap to your body ONLY FROM THE NECK DOWN.   Do not use on face/  open                           Wound or open sores. Avoid contact with eyes, ears mouth and genitals (private parts).                       Wash face,  Genitals (private parts) with your normal soap.             6.  Wash thoroughly, paying special attention to the area where your surgery  will be performed.  7.  Thoroughly rinse your body with warm water from the neck down.  8.  DO NOT shower/wash with your normal soap after using and rinsing off  the CHG Soap.                9.  Pat yourself dry with a clean towel.            10.  Wear clean pajamas.            11.  Place clean sheets on your bed the night of your first shower and do not  sleep with pets. Day of Surgery : Do not apply any lotions/deodorants the morning of surgery.  Please wear clean clothes to the hospital/surgery center.  FAILURE TO FOLLOW THESE INSTRUCTIONS MAY RESULT IN THE CANCELLATION OF YOUR SURGERY [name] SIGNATURE_________________________________  NURSE SIGNATURE__________________________________  ________________________________________________________________________   Dawn [name]  An incentive spirometer is a tool that can help keep your lungs clear and active. This tool measures how well you are filling your lungs with each breath. Taking long deep breaths may help reverse or decrease the chance of developing breathing (pulmonary) problems (especially infection) following:  A long  period of time when you are unable to move or be active. BEFORE THE PROCEDURE   If the spirometer includes an indicator to show your best effort, your nurse or respiratory therapist will set it to a desired goal.  If possible, sit up straight or lean slightly forward. Try not to slouch.  Hold the incentive spirometer in an upright position. INSTRUCTIONS FOR USE  1. Sit on the edge of your bed if possible, or sit up as far as you can in bed or on a chair. 2. Hold the incentive spirometer in an upright position. 3. Breathe out normally. 4. Place the mouthpiece in your mouth and seal your lips tightly around it. 5. Breathe in slowly and as deeply as possible, raising the piston or the ball toward the top of the column. 6. Hold your breath for 3-5 seconds or for as long as possible. Allow the piston or ball to fall to the bottom of the column. 7. Remove the mouthpiece from your mouth and breathe out normally. 8. Rest for a few seconds and repeat Steps 1 through 7 at least 10 times every 1-2 hours when you are awake. Take your time and take a few normal breaths between deep breaths. 9. The spirometer may include an indicator to show your best effort. Use the indicator as a goal to work toward during each repetition. 10. After each set of 10 deep breaths, practice coughing to be sure your lungs are clear. If you have an incision (the cut made at the time of surgery), support your incision when coughing by placing a pillow or rolled up towels firmly against it. Once you are able to get out of bed, walk around indoors and  cough well. You may stop using the incentive spirometer when instructed by your caregiver.  RISKS AND COMPLICATIONS  Take your time so you do not get dizzy or light-headed.  If you are in pain, you may need to take or ask for pain medication before doing incentive spirometry. It is harder to take a deep breath if you are having pain. AFTER USE  Rest and breathe slowly and  easily.  It can be helpful to keep track of a log of your progress. Your caregiver can provide you with a simple table to help with this. If you are using the spirometer at home, follow these instructions: [name] IF:   You are having difficultly using the spirometer.  You have trouble using the spirometer as often as instructed.  Your pain medication is not giving enough relief while using the spirometer.  You develop fever of 100.5 F (38.1 C) or higher. SEEK IMMEDIATE MEDICAL CARE IF:   You cough up bloody sputum that had not been present before.  You develop fever of 102 F (38.9 C) or greater.  You develop worsening pain at or near the incision site. MAKE SURE YOU:   Understand these instructions.  Will watch your condition.  Will get help right away if you are not doing well or get worse. Document Released: 09/09/2006 Document Revised: 07/22/2011 Document Reviewed: 11/10/2006 [employer] [name] Information 2014 [employer], [place].   ________________________________________________________________________  WHAT IS A BLOOD TRANSFUSION? Blood Transfusion Information  A transfusion is the replacement of blood or some of its parts. Blood is made up of multiple cells which provide different functions.  Red blood cells carry oxygen and are used for blood loss replacement.  White blood cells fight against infection.  Platelets control bleeding.  Plasma helps clot blood.  Other blood products are available for specialized needs, such as hemophilia or other clotting disorders. BEFORE THE TRANSFUSION  Who gives blood for transfusions?   Healthy volunteers who are fully evaluated to make sure their blood is safe. This is blood bank blood. Transfusion therapy is the safest it has ever been in the practice of medicine. Before blood is taken from a donor, a complete history is taken to make sure that person has no history of diseases nor engages in risky social  behavior (examples are intravenous drug use or sexual activity with multiple partners). The donor's travel history is screened to minimize risk of transmitting infections, such as malaria. The donated blood is tested for signs of infectious diseases, such as HIV and hepatitis. The blood is then tested to be sure it is compatible with you in order to minimize the chance of a transfusion reaction. If you or a relative donates blood, this is often done in anticipation of surgery and is not appropriate for emergency situations. It takes many days to process the donated blood. RISKS AND COMPLICATIONS Although transfusion therapy is very safe and saves many lives, the main dangers of transfusion include:   Getting an infectious disease.  Developing a transfusion reaction. This is an allergic reaction to something in the blood you were given. Every precaution is taken to prevent this. The decision to have a blood transfusion has been considered carefully by your caregiver before blood is given. Blood is not given unless the benefits outweigh the risks. AFTER THE TRANSFUSION  Right after receiving a blood transfusion, you will usually feel much better and more energetic. This is especially true if your red blood cells have gotten low (anemic).  The transfusion raises the level of the red blood cells which carry oxygen, and this usually causes an energy increase.  The nurse administering the transfusion will monitor you carefully for complications. HOME CARE INSTRUCTIONS  No special instructions are needed after a transfusion. You may find your energy is better. Speak with your caregiver about any limitations on activity for underlying diseases you may have. SEEK MEDICAL CARE IF:   Your condition is not improving after your transfusion.  You develop redness or irritation at the intravenous (IV) site. SEEK IMMEDIATE MEDICAL CARE IF:  Any of the following symptoms occur over the next 12 hours:  Shaking  chills.  You have a temperature by mouth above 102 F (38.9 C), not controlled by medicine.  Chest, back, or muscle pain.  People around you feel you are not acting correctly or are confused.  Shortness of breath or difficulty breathing.  Dizziness and fainting.  You get a rash or develop hives.  You have a decrease in urine output.  Your urine turns a dark color or changes to pink, red, or brown. Any of the following symptoms occur over the next 10 days:  You have a temperature by mouth above 102 F (38.9 C), not controlled by medicine.  Shortness of breath.  Weakness after normal activity.  The white part of the eye turns yellow (jaundice).  You have a decrease in the amount of urine or are urinating less often.  Your urine turns a dark color or changes to pink, red, or brown. Document Released: 04/26/2000 Document Revised: 07/22/2011 Document Reviewed: 12/14/2007 [facility] [name] Information 2014 [place], [place].  _______________________________________________________________________

## 2016-11-12 NOTE — Patient Instructions (Signed)
Vertigo Vertigo is the feeling that you or your surroundings are moving when they are not. Vertigo can be dangerous if it occurs while you are doing something that could endanger you or others, such as driving. What are the causes? This condition is caused by a disturbance in the signals that are sent by your body's sensory systems to your brain. Different causes of a disturbance can lead to vertigo, including:  Infections, especially in the inner ear.  A bad reaction to a drug, or misuse of alcohol and medicines.  Withdrawal from drugs or alcohol.  Quickly changing positions, as when lying down or rolling over in bed.  Migraine headaches.  Decreased blood flow to the brain.  Decreased blood pressure.  Increased pressure in the brain from a head or neck injury, stroke, infection, tumor, or bleeding.  Central nervous system disorders.  What are the signs or symptoms? Symptoms of this condition usually occur when you move your head or your eyes in different directions. Symptoms may start suddenly, and they usually last for less than a minute. Symptoms may include:  Loss of balance and falling.  Feeling like you are spinning or moving.  Feeling like your surroundings are spinning or moving.  Nausea and vomiting.  Blurred vision or double vision.  Difficulty hearing.  Slurred speech.  Dizziness.  Involuntary eye movement (nystagmus).  Symptoms can be mild and cause only slight annoyance, or they can be severe and interfere with daily life. Episodes of vertigo may return (recur) over time, and they are often triggered by certain movements. Symptoms may improve over time. How is this diagnosed? This condition may be diagnosed based on medical history and the quality of your nystagmus. Your health care provider may test your eye movements by asking you to quickly change positions to trigger the nystagmus. This may be called the Dix-Hallpike test, head thrust test, or roll test.  You may be referred to a health care provider who specializes in ear, nose, and throat (ENT) problems (otolaryngologist) or a provider who specializes in disorders of the central nervous system (neurologist). You may have additional testing, including:  A physical exam.  Blood tests.  MRI.  A CT scan.  An electrocardiogram (ECG). This records electrical activity in your heart.  An electroencephalogram (EEG). This records electrical activity in your brain.  Hearing tests.  How is this treated? Treatment for this condition depends on the cause and the severity of the symptoms. Treatment options include:  Medicines to treat nausea or vertigo. These are usually used for severe cases. Some medicines that are used to treat other conditions may also reduce or eliminate vertigo symptoms. These include: ? Medicines that control allergies (antihistamines). ? Medicines that control seizures (anticonvulsants). ? Medicines that relieve depression (antidepressants). ? Medicines that relieve anxiety (sedatives).  Head movements to adjust your inner ear back to normal. If your vertigo is caused by an ear problem, your health care provider may recommend certain movements to correct the problem.  Surgery. This is rare.  Follow these instructions at home: Safety  Move slowly.Avoid sudden body or head movements.  Avoid driving.  Avoid operating heavy machinery.  Avoid doing any tasks that would cause danger to you or others if you would have a vertigo episode during the task.  If you have trouble walking or keeping your balance, try using a cane for stability. If you feel dizzy or unstable, sit down right away.  Return to your normal activities as told by your   health care provider. Ask your health care provider what activities are safe for you. General instructions  Take over-the-counter and prescription medicines only as told by your health care provider.  Avoid certain positions or  movements as told by your health care provider.  Drink enough fluid to keep your urine clear or pale yellow.  Keep all follow-up visits as told by your health care provider. This is important. Contact a health care provider if:  Your medicines do not relieve your vertigo or they make it worse.  You have a fever.  Your condition gets worse or you develop new symptoms.  Your family or friends notice any behavioral changes.  Your nausea or vomiting gets worse.  You have numbness or a "pins and needles" sensation in part of your body. Get help right away if:  You have difficulty moving or speaking.  You are always dizzy.  You faint.  You develop severe headaches.  You have weakness in your hands, arms, or legs.  You have changes in your hearing or vision.  You develop a stiff neck.  You develop sensitivity to light. This information is not intended to replace advice given to you by your health care provider. Make sure you discuss any questions you have with your health care provider. Document Released: 02/06/2005 Document Revised: 10/11/2015 Document Reviewed: 08/22/2014 Elsevier Interactive Patient Education  2017 [employer]  

## 2016-11-14 ENCOUNTER — Encounter (HOSPITAL_COMMUNITY)
Admission: RE | Admit: 2016-11-14 | Discharge: 2016-11-14 | Disposition: A | Discharge status: Home / Self Care | Payer: Medicare Other | Source: Ambulatory Visit | Attending: Orthopedic Surgery | Admitting: Orthopedic Surgery

## 2016-11-14 ENCOUNTER — Encounter (HOSPITAL_COMMUNITY): Payer: Self-pay

## 2016-11-14 DIAGNOSIS — Z01818 Encounter for other preprocedural examination: Secondary | ICD-10-CM | POA: Diagnosis not present

## 2016-11-14 DIAGNOSIS — I10 Essential (primary) hypertension: Secondary | ICD-10-CM | POA: Diagnosis not present

## 2016-11-14 DIAGNOSIS — R9431 Abnormal electrocardiogram [ECG] [EKG]: Secondary | ICD-10-CM | POA: Insufficient documentation

## 2016-11-14 DIAGNOSIS — M1712 Unilateral primary osteoarthritis, left knee: Secondary | ICD-10-CM | POA: Diagnosis not present

## 2016-11-14 DIAGNOSIS — Z01810 Encounter for preprocedural cardiovascular examination: Secondary | ICD-10-CM | POA: Diagnosis not present

## 2016-11-14 LAB — CBC
HCT: 43.3 % (ref 36.0–46.0)
Hemoglobin: 14.5 g/dL (ref 12.0–15.0)
MCH: 31 pg (ref 26.0–34.0)
MCHC: 33.5 g/dL (ref 30.0–36.0)
MCV: 92.5 fL (ref 78.0–100.0)
PLATELETS: 269 10*3/uL (ref 150–400)
RBC: 4.68 MIL/uL (ref 3.87–5.11)
RDW: 13.2 % (ref 11.5–15.5)
WBC: 5.6 10*3/uL (ref 4.0–10.5)

## 2016-11-14 LAB — BASIC METABOLIC PANEL
ANION GAP: 9 (ref 5–15)
BUN: 19 mg/dL (ref 6–20)
CO2: 26 mmol/L (ref 22–32)
Calcium: 9.2 mg/dL (ref 8.9–10.3)
Chloride: 106 mmol/L (ref 101–111)
Creatinine, Ser: 0.82 mg/dL (ref 0.44–1.00)
GFR calc Af Amer: >60 mL/min (ref >60)
Glucose, Bld: 116 mg/dL — ABNORMAL HIGH (ref 65–99)
Potassium: 4.5 mmol/L (ref 3.5–5.1)
SODIUM: 141 mmol/L (ref 135–145)

## 2016-11-14 LAB — SURGICAL PCR SCREEN
MRSA, PCR: NEGATIVE
Staphylococcus aureus: NEGATIVE

## 2016-11-14 LAB — ABO/RH: ABO/RH(D): O POS

## 2016-11-14 NOTE — Progress Notes (Signed)
Patient with EKG with noted changes since last EKG. RN consulted with RN colleague who agreed patient did not need anesthesia consult as changes were not significantly abnormal . RN made patient aware and patient verbalized understanding .

## 2016-11-19 ENCOUNTER — Encounter (HOSPITAL_COMMUNITY): Payer: Self-pay

## 2016-11-19 ENCOUNTER — Inpatient Hospital Stay (HOSPITAL_COMMUNITY): Payer: Medicare Other | Admitting: Anesthesiology

## 2016-11-19 ENCOUNTER — Encounter (HOSPITAL_COMMUNITY)
Admission: RE | Disposition: A | Discharge status: Home / Self Care | Payer: Self-pay | Source: Ambulatory Visit | Attending: Orthopedic Surgery

## 2016-11-19 ENCOUNTER — Inpatient Hospital Stay (HOSPITAL_COMMUNITY)
Admission: RE | Admit: 2016-11-19 | Discharge: 2016-11-21 | DRG: 470 | Disposition: A | Discharge status: Home / Self Care | Payer: Medicare Other | Source: Ambulatory Visit | Attending: Orthopedic Surgery | Admitting: Orthopedic Surgery

## 2016-11-19 DIAGNOSIS — Z96659 Presence of unspecified artificial knee joint: Secondary | ICD-10-CM

## 2016-11-19 DIAGNOSIS — Z96652 Presence of left artificial knee joint: Secondary | ICD-10-CM

## 2016-11-19 DIAGNOSIS — M659 Synovitis and tenosynovitis, unspecified: Secondary | ICD-10-CM | POA: Diagnosis present

## 2016-11-19 DIAGNOSIS — Z6836 Body mass index (BMI) 36.0-36.9, adult: Secondary | ICD-10-CM | POA: Diagnosis not present

## 2016-11-19 DIAGNOSIS — Z87891 Personal history of nicotine dependence: Secondary | ICD-10-CM

## 2016-11-19 DIAGNOSIS — Z85828 Personal history of other malignant neoplasm of skin: Secondary | ICD-10-CM

## 2016-11-19 DIAGNOSIS — M1712 Unilateral primary osteoarthritis, left knee: Principal | ICD-10-CM | POA: Diagnosis present

## 2016-11-19 DIAGNOSIS — Z888 Allergy status to other drugs, medicaments and biological substances status: Secondary | ICD-10-CM | POA: Diagnosis not present

## 2016-11-19 DIAGNOSIS — G8918 Other acute postprocedural pain: Secondary | ICD-10-CM | POA: Diagnosis not present

## 2016-11-19 DIAGNOSIS — E669 Obesity, unspecified: Secondary | ICD-10-CM | POA: Diagnosis present

## 2016-11-19 DIAGNOSIS — Z801 Family history of malignant neoplasm of trachea, bronchus and lung: Secondary | ICD-10-CM | POA: Diagnosis not present

## 2016-11-19 DIAGNOSIS — Z8249 Family history of ischemic heart disease and other diseases of the circulatory system: Secondary | ICD-10-CM | POA: Diagnosis not present

## 2016-11-19 DIAGNOSIS — Z833 Family history of diabetes mellitus: Secondary | ICD-10-CM

## 2016-11-19 DIAGNOSIS — I10 Essential (primary) hypertension: Secondary | ICD-10-CM | POA: Diagnosis present

## 2016-11-19 HISTORY — PX: TOTAL KNEE ARTHROPLASTY: SHX125

## 2016-11-19 LAB — TYPE AND SCREEN
ABO/RH(D): O POS
ANTIBODY SCREEN: NEGATIVE

## 2016-11-19 SURGERY — ARTHROPLASTY, KNEE, TOTAL
Anesthesia: Spinal | Site: Knee | Laterality: Left

## 2016-11-19 MED ORDER — MECLIZINE HCL 25 MG PO TABS: 12.5000 mg | ORAL_TABLET | Freq: Three times a day (TID) | ORAL | Status: DC | PRN

## 2016-11-19 MED ORDER — BUPIVACAINE-EPINEPHRINE (PF) 0.25% -1:200000 IJ SOLN
INTRAMUSCULAR | Status: DC | PRN
  Administered 2016-11-19: 30 mL

## 2016-11-19 MED ORDER — ONDANSETRON HCL 4 MG/2ML IJ SOLN
INTRAMUSCULAR | Status: DC | PRN
  Administered 2016-11-19: 4 mg via INTRAVENOUS

## 2016-11-19 MED ORDER — EPHEDRINE SULFATE 50 MG/ML IJ SOLN
INTRAMUSCULAR | Status: DC | PRN
  Administered 2016-11-19 (×5): 10 mg via INTRAVENOUS

## 2016-11-19 MED ORDER — FENTANYL CITRATE (PF) 100 MCG/2ML IJ SOLN
INTRAMUSCULAR | Status: AC
  Filled 2016-11-19: qty 2

## 2016-11-19 MED ORDER — HYDROMORPHONE HCL-NACL 0.5-0.9 MG/ML-% IV SOSY
PREFILLED_SYRINGE | INTRAVENOUS | Status: AC
  Filled 2016-11-19: qty 2

## 2016-11-19 MED ORDER — BISACODYL 10 MG RE SUPP: 10.0000 mg | Freq: Every day | RECTAL | Status: DC | PRN

## 2016-11-19 MED ORDER — SODIUM CHLORIDE 0.9 % IJ SOLN
INTRAMUSCULAR | Status: DC | PRN
  Administered 2016-11-19: 30 mL

## 2016-11-19 MED ORDER — POLYETHYLENE GLYCOL 3350 17 G PO PACK: 17.0000 g | PACK | Freq: Two times a day (BID) | ORAL | 0 refills | Status: DC

## 2016-11-19 MED ORDER — LIDOCAINE 2% (20 MG/ML) 5 ML SYRINGE
INTRAMUSCULAR | Status: AC
  Filled 2016-11-19: qty 5

## 2016-11-19 MED ORDER — FENTANYL CITRATE (PF) 100 MCG/2ML IJ SOLN: 25.0000 ug | INTRAMUSCULAR | Status: DC | PRN

## 2016-11-19 MED ORDER — LACTATED RINGERS IV SOLN
INTRAVENOUS | Status: DC
  Administered 2016-11-19: 12:00:00 via INTRAVENOUS
  Administered 2016-11-19: 1000 mL via INTRAVENOUS
  Administered 2016-11-19: 11:00:00 via INTRAVENOUS

## 2016-11-19 MED ORDER — MIDAZOLAM HCL 2 MG/2ML IJ SOLN
INTRAMUSCULAR | Status: AC
  Administered 2016-11-19: 2 mg via INTRAVENOUS
  Filled 2016-11-19: qty 2

## 2016-11-19 MED ORDER — SODIUM CHLORIDE 0.9 % IR SOLN
Status: DC | PRN
  Administered 2016-11-19: 1000 mL

## 2016-11-19 MED ORDER — METHOCARBAMOL 1000 MG/10ML IJ SOLN
500.0000 mg | Freq: Four times a day (QID) | INTRAVENOUS | Status: DC | PRN
  Administered 2016-11-19: 500 mg via INTRAVENOUS
  Filled 2016-11-19: qty 550

## 2016-11-19 MED ORDER — HYDROCODONE-ACETAMINOPHEN 7.5-325 MG PO TABS
1.0000 | ORAL_TABLET | ORAL | Status: DC
  Administered 2016-11-19 (×3): 2 via ORAL
  Administered 2016-11-20 (×2): 1 via ORAL
  Administered 2016-11-20 – 2016-11-21 (×6): 2 via ORAL
  Filled 2016-11-19: qty 2
  Filled 2016-11-19: qty 1
  Filled 2016-11-19 (×5): qty 2
  Filled 2016-11-19: qty 1
  Filled 2016-11-19 (×3): qty 2

## 2016-11-19 MED ORDER — TRANEXAMIC ACID 1000 MG/10ML IV SOLN
1000.0000 mg | Freq: Once | INTRAVENOUS | Status: AC
  Administered 2016-11-19: 1000 mg via INTRAVENOUS
  Filled 2016-11-19: qty 1100

## 2016-11-19 MED ORDER — METHOCARBAMOL 500 MG PO TABS
500.0000 mg | ORAL_TABLET | Freq: Four times a day (QID) | ORAL | Status: DC | PRN
  Administered 2016-11-19 – 2016-11-21 (×3): 500 mg via ORAL
  Filled 2016-11-19 (×3): qty 1

## 2016-11-19 MED ORDER — CEFAZOLIN SODIUM-DEXTROSE 2-4 GM/100ML-% IV SOLN
2.0000 g | Freq: Four times a day (QID) | INTRAVENOUS | Status: AC
  Administered 2016-11-19 (×2): 2 g via INTRAVENOUS
  Filled 2016-11-19 (×2): qty 100

## 2016-11-19 MED ORDER — DEXAMETHASONE SODIUM PHOSPHATE 10 MG/ML IJ SOLN
10.0000 mg | Freq: Once | INTRAMUSCULAR | Status: AC
  Administered 2016-11-20: 10 mg via INTRAVENOUS
  Filled 2016-11-19: qty 1

## 2016-11-19 MED ORDER — MIDAZOLAM HCL 2 MG/2ML IJ SOLN
1.0000 mg | INTRAMUSCULAR | Status: DC | PRN
  Administered 2016-11-19 (×3): 0.5 mg via INTRAVENOUS
  Administered 2016-11-19: 2 mg via INTRAVENOUS
  Administered 2016-11-19: 0.5 mg via INTRAVENOUS
  Filled 2016-11-19 (×5): qty 2

## 2016-11-19 MED ORDER — HYDROCODONE-ACETAMINOPHEN 7.5-325 MG PO TABS: 1.0000 | ORAL_TABLET | ORAL | 0 refills | Status: DC | PRN

## 2016-11-19 MED ORDER — CELECOXIB 200 MG PO CAPS
200.0000 mg | ORAL_CAPSULE | Freq: Two times a day (BID) | ORAL | Status: DC
  Administered 2016-11-19 – 2016-11-21 (×4): 200 mg via ORAL
  Filled 2016-11-19 (×4): qty 1

## 2016-11-19 MED ORDER — DOCUSATE SODIUM 100 MG PO CAPS: 100.0000 mg | ORAL_CAPSULE | Freq: Two times a day (BID) | ORAL | 0 refills | Status: DC

## 2016-11-19 MED ORDER — ONDANSETRON HCL 4 MG/2ML IJ SOLN: 4.0000 mg | Freq: Four times a day (QID) | INTRAMUSCULAR | Status: DC | PRN

## 2016-11-19 MED ORDER — PROPOFOL 10 MG/ML IV BOLUS
INTRAVENOUS | Status: AC
  Filled 2016-11-19: qty 20

## 2016-11-19 MED ORDER — HYDROMORPHONE HCL-NACL 0.5-0.9 MG/ML-% IV SOSY: 0.2500 mg | PREFILLED_SYRINGE | INTRAVENOUS | Status: DC | PRN

## 2016-11-19 MED ORDER — MAGNESIUM CITRATE PO SOLN: 1.0000 | Freq: Once | ORAL | Status: DC | PRN

## 2016-11-19 MED ORDER — ONDANSETRON HCL 4 MG PO TABS: 4.0000 mg | ORAL_TABLET | Freq: Four times a day (QID) | ORAL | Status: DC | PRN

## 2016-11-19 MED ORDER — MENTHOL 3 MG MT LOZG: 1.0000 | LOZENGE | OROMUCOSAL | Status: DC | PRN

## 2016-11-19 MED ORDER — PROPOFOL 10 MG/ML IV BOLUS
INTRAVENOUS | Status: DC | PRN
  Administered 2016-11-19 (×3): 20 mg via INTRAVENOUS
  Administered 2016-11-19: 10 mg via INTRAVENOUS
  Administered 2016-11-19: 20 mg via INTRAVENOUS
  Administered 2016-11-19: 10 mg via INTRAVENOUS
  Administered 2016-11-19 (×4): 20 mg via INTRAVENOUS

## 2016-11-19 MED ORDER — METHOCARBAMOL 500 MG PO TABS: 500.0000 mg | ORAL_TABLET | Freq: Four times a day (QID) | ORAL | 0 refills | Status: DC | PRN

## 2016-11-19 MED ORDER — METOCLOPRAMIDE HCL 5 MG/ML IJ SOLN: 5.0000 mg | Freq: Three times a day (TID) | INTRAMUSCULAR | Status: DC | PRN

## 2016-11-19 MED ORDER — DIPHENHYDRAMINE HCL 25 MG PO CAPS: 25.0000 mg | ORAL_CAPSULE | Freq: Four times a day (QID) | ORAL | Status: DC | PRN

## 2016-11-19 MED ORDER — ONDANSETRON HCL 4 MG/2ML IJ SOLN
INTRAMUSCULAR | Status: AC
  Filled 2016-11-19: qty 2

## 2016-11-19 MED ORDER — POLYETHYLENE GLYCOL 3350 17 G PO PACK
17.0000 g | PACK | Freq: Two times a day (BID) | ORAL | Status: DC
  Administered 2016-11-20: 17 g via ORAL
  Filled 2016-11-19 (×3): qty 1

## 2016-11-19 MED ORDER — FERROUS SULFATE 325 (65 FE) MG PO TABS: 325.0000 mg | ORAL_TABLET | Freq: Three times a day (TID) | ORAL | Status: DC

## 2016-11-19 MED ORDER — ASPIRIN 81 MG PO CHEW
81.0000 mg | CHEWABLE_TABLET | Freq: Two times a day (BID) | ORAL | Status: DC
  Administered 2016-11-19 – 2016-11-21 (×4): 81 mg via ORAL
  Filled 2016-11-19 (×4): qty 1

## 2016-11-19 MED ORDER — FERROUS SULFATE 325 (65 FE) MG PO TABS
325.0000 mg | ORAL_TABLET | Freq: Three times a day (TID) | ORAL | Status: DC
  Administered 2016-11-19 – 2016-11-21 (×4): 325 mg via ORAL
  Filled 2016-11-19 (×4): qty 1

## 2016-11-19 MED ORDER — CEFAZOLIN SODIUM-DEXTROSE 2-4 GM/100ML-% IV SOLN
INTRAVENOUS | Status: AC
  Filled 2016-11-19: qty 100

## 2016-11-19 MED ORDER — DOCUSATE SODIUM 100 MG PO CAPS
100.0000 mg | ORAL_CAPSULE | Freq: Two times a day (BID) | ORAL | Status: DC
  Administered 2016-11-19 – 2016-11-21 (×4): 100 mg via ORAL
  Filled 2016-11-19 (×4): qty 1

## 2016-11-19 MED ORDER — MIDAZOLAM HCL 2 MG/2ML IJ SOLN
INTRAMUSCULAR | Status: AC
  Filled 2016-11-19: qty 2

## 2016-11-19 MED ORDER — SODIUM CHLORIDE 0.9 % IJ SOLN
INTRAMUSCULAR | Status: AC
  Filled 2016-11-19: qty 50

## 2016-11-19 MED ORDER — SODIUM CHLORIDE 0.9 % IV SOLN
INTRAVENOUS | Status: DC
  Administered 2016-11-19: 15:00:00 via INTRAVENOUS

## 2016-11-19 MED ORDER — METOCLOPRAMIDE HCL 5 MG PO TABS: 5.0000 mg | ORAL_TABLET | Freq: Three times a day (TID) | ORAL | Status: DC | PRN

## 2016-11-19 MED ORDER — CHLORHEXIDINE GLUCONATE 4 % EX LIQD: 60.0000 mL | Freq: Once | CUTANEOUS | Status: DC

## 2016-11-19 MED ORDER — KETOROLAC TROMETHAMINE 30 MG/ML IJ SOLN
INTRAMUSCULAR | Status: AC
  Filled 2016-11-19: qty 1

## 2016-11-19 MED ORDER — PHENOL 1.4 % MT LIQD: 1.0000 | OROMUCOSAL | Status: DC | PRN

## 2016-11-19 MED ORDER — PROPOFOL 10 MG/ML IV BOLUS
INTRAVENOUS | Status: AC
  Filled 2016-11-19: qty 60

## 2016-11-19 MED ORDER — BUPIVACAINE IN DEXTROSE 0.75-8.25 % IT SOLN
INTRATHECAL | Status: DC | PRN
  Administered 2016-11-19: 2 mL via INTRATHECAL

## 2016-11-19 MED ORDER — DEXAMETHASONE SODIUM PHOSPHATE 10 MG/ML IJ SOLN
INTRAMUSCULAR | Status: AC
  Filled 2016-11-19: qty 1

## 2016-11-19 MED ORDER — ASPIRIN 81 MG PO CHEW: 81.0000 mg | CHEWABLE_TABLET | Freq: Two times a day (BID) | ORAL | 0 refills | Status: AC

## 2016-11-19 MED ORDER — TRANEXAMIC ACID 1000 MG/10ML IV SOLN
1000.0000 mg | INTRAVENOUS | Status: AC
  Administered 2016-11-19: 1000 mg via INTRAVENOUS
  Filled 2016-11-19: qty 1100

## 2016-11-19 MED ORDER — AMLODIPINE BESYLATE 10 MG PO TABS
10.0000 mg | ORAL_TABLET | Freq: Every day | ORAL | Status: DC
  Administered 2016-11-19: 10 mg via ORAL
  Filled 2016-11-19 (×2): qty 1

## 2016-11-19 MED ORDER — LIDOCAINE 2% (20 MG/ML) 5 ML SYRINGE
INTRAMUSCULAR | Status: DC | PRN
  Administered 2016-11-19: 60 mg via INTRAVENOUS

## 2016-11-19 MED ORDER — PROPOFOL 500 MG/50ML IV EMUL
INTRAVENOUS | Status: DC | PRN
  Administered 2016-11-19: 55 ug/kg/min via INTRAVENOUS

## 2016-11-19 MED ORDER — PROMETHAZINE HCL 25 MG/ML IJ SOLN: 6.2500 mg | INTRAMUSCULAR | Status: DC | PRN

## 2016-11-19 MED ORDER — BUPIVACAINE-EPINEPHRINE (PF) 0.25% -1:200000 IJ SOLN
INTRAMUSCULAR | Status: AC
  Filled 2016-11-19: qty 30

## 2016-11-19 MED ORDER — CEFAZOLIN SODIUM-DEXTROSE 2-4 GM/100ML-% IV SOLN
2.0000 g | INTRAVENOUS | Status: AC
  Administered 2016-11-19: 2 g via INTRAVENOUS

## 2016-11-19 MED ORDER — DEXAMETHASONE SODIUM PHOSPHATE 10 MG/ML IJ SOLN
10.0000 mg | Freq: Once | INTRAMUSCULAR | Status: AC
  Administered 2016-11-19: 10 mg via INTRAVENOUS

## 2016-11-19 MED ORDER — ALUM & MAG HYDROXIDE-SIMETH 200-200-20 MG/5ML PO SUSP: 15.0000 mL | ORAL | Status: DC | PRN

## 2016-11-19 MED ORDER — KETOROLAC TROMETHAMINE 30 MG/ML IJ SOLN
INTRAMUSCULAR | Status: DC | PRN
  Administered 2016-11-19: 30 mg

## 2016-11-19 MED ORDER — EPHEDRINE 5 MG/ML INJ
INTRAVENOUS | Status: AC
  Filled 2016-11-19: qty 10

## 2016-11-19 MED ORDER — FENTANYL CITRATE (PF) 100 MCG/2ML IJ SOLN
50.0000 ug | INTRAMUSCULAR | Status: DC | PRN
  Administered 2016-11-19: 50 ug via INTRAVENOUS

## 2016-11-19 SURGICAL SUPPLY — 48 items
ADH SKN CLS APL DERMABOND .7 (GAUZE/BANDAGES/DRESSINGS) ×1
BAG DECANTER FOR FLEXI CONT (MISCELLANEOUS) IMPLANT
BAG SPEC THK2 15X12 ZIP CLS (MISCELLANEOUS)
BAG ZIPLOCK 12X15 (MISCELLANEOUS) IMPLANT
BANDAGE ACE 6X5 VEL STRL LF (GAUZE/BANDAGES/DRESSINGS) ×2 IMPLANT
BLADE SAW SGTL 11.0X1.19X90.0M (BLADE) IMPLANT
BLADE SAW SGTL 13.0X1.19X90.0M (BLADE) ×2 IMPLANT
BOWL SMART MIX CTS (DISPOSABLE) ×2 IMPLANT
CAPT KNEE TOTAL 3 ATTUNE ×1 IMPLANT
CEMENT HV SMART SET (Cement) ×2 IMPLANT
COVER SURGICAL LIGHT HANDLE (MISCELLANEOUS) ×2 IMPLANT
CUFF TOURN SGL QUICK 34 (TOURNIQUET CUFF) ×2
CUFF TRNQT CYL 34X4X40X1 (TOURNIQUET CUFF) ×1 IMPLANT
DECANTER SPIKE VIAL GLASS SM (MISCELLANEOUS) ×2 IMPLANT
DERMABOND ADVANCED (GAUZE/BANDAGES/DRESSINGS) ×1
DERMABOND ADVANCED .7 DNX12 (GAUZE/BANDAGES/DRESSINGS) ×1 IMPLANT
DRAPE U-SHAPE 47X51 STRL (DRAPES) ×2 IMPLANT
DRESSING AQUACEL AG SP 3.5X10 (GAUZE/BANDAGES/DRESSINGS) ×1 IMPLANT
DRSG AQUACEL AG ADV 3.5X10 (GAUZE/BANDAGES/DRESSINGS) ×1 IMPLANT
DRSG AQUACEL AG SP 3.5X10 (GAUZE/BANDAGES/DRESSINGS) ×2
DURAPREP 26ML APPLICATOR (WOUND CARE) ×4 IMPLANT
ELECT REM PT RETURN 15FT ADLT (MISCELLANEOUS) ×2 IMPLANT
GLOVE BIOGEL M 7.0 STRL (GLOVE) IMPLANT
GLOVE BIOGEL PI IND STRL 7.5 (GLOVE) ×1 IMPLANT
GLOVE BIOGEL PI IND STRL 8.5 (GLOVE) ×1 IMPLANT
GLOVE BIOGEL PI INDICATOR 7.5 (GLOVE) ×5
GLOVE BIOGEL PI INDICATOR 8.5 (GLOVE) ×1
GLOVE ECLIPSE 8.0 STRL XLNG CF (GLOVE) ×3 IMPLANT
GLOVE ORTHO TXT STRL SZ7.5 (GLOVE) ×3 IMPLANT
GOWN STRL REUS W/TWL LRG LVL3 (GOWN DISPOSABLE) ×3 IMPLANT
GOWN STRL REUS W/TWL XL LVL3 (GOWN DISPOSABLE) ×3 IMPLANT
HANDPIECE INTERPULSE COAX TIP (DISPOSABLE) ×2
MANIFOLD NEPTUNE II (INSTRUMENTS) ×2 IMPLANT
PACK TOTAL KNEE CUSTOM (KITS) ×2 IMPLANT
POSITIONER SURGICAL ARM (MISCELLANEOUS) ×2 IMPLANT
SET HNDPC FAN SPRY TIP SCT (DISPOSABLE) ×1 IMPLANT
SET PAD KNEE POSITIONER (MISCELLANEOUS) ×2 IMPLANT
SUT MNCRL AB 4-0 PS2 18 (SUTURE) ×2 IMPLANT
SUT STRATAFIX 0 PDS 27 VIOLET (SUTURE) ×2
SUT VIC AB 1 CT1 36 (SUTURE) ×2 IMPLANT
SUT VIC AB 2-0 CT1 27 (SUTURE) ×6
SUT VIC AB 2-0 CT1 TAPERPNT 27 (SUTURE) ×3 IMPLANT
SUTURE STRATFX 0 PDS 27 VIOLET (SUTURE) ×1 IMPLANT
SYR 50ML LL SCALE MARK (SYRINGE) ×2 IMPLANT
TRAY FOLEY W/METER SILVER 16FR (SET/KITS/TRAYS/PACK) ×2 IMPLANT
WATER STERILE IRR 1500ML POUR (IV SOLUTION) ×2 IMPLANT
WRAP KNEE MAXI GEL POST OP (GAUZE/BANDAGES/DRESSINGS) ×2 IMPLANT
YANKAUER SUCT BULB TIP 10FT TU (MISCELLANEOUS) ×2 IMPLANT

## 2016-11-19 NOTE — Transfer of Care (Signed)
Immediate Anesthesia Transfer of Care Note  Patient: Dawn [name]  Procedure(s) Performed: Procedure(s) with comments: LEFT TOTAL KNEE ARTHROPLASTY (Left) - 70 mins  Patient Location: PACU  Anesthesia Type:MAC, Regional and Spinal  Level of Consciousness: awake, alert  and oriented  Airway & Oxygen Therapy: Patient Spontanous Breathing and Patient connected to face mask oxygen  Post-op Assessment: Report given to RN and Post -op Vital signs reviewed and stable  Post vital signs: Reviewed and stable  Last Vitals:  Vitals:   11/19/16 1205 11/19/16 1215  BP: 122/74 130/77  Pulse: 75 74  Resp: 19 16  Temp: (!) 36.3 C     Last Pain:  Vitals:   11/19/16 1230  TempSrc:   PainSc: 0-No pain      Patients Stated Pain Goal: 4 (47/09/29 5747)  Complications: No apparent anesthesia complications

## 2016-11-19 NOTE — Evaluation (Signed)
Physical Therapy Evaluation Patient Details Name: Dawn [name] MRN: [id number] DOB: 1950/07/04 Today's Date: 11/19/2016   History of Present Illness  L TKA  Clinical Impression  The patient ambulated x 70' with 2/01 pain. Plans DC tomorrow with OPPT. Pt admitted with above diagnosis. Pt currently with functional limitations due to the deficits listed below (see PT Problem List). Pt will benefit from skilled PT to increase their independence and safety with mobility to allow discharge to the venue listed below.     Follow Up Recommendations Outpatient PT    Equipment Recommendations  None recommended by PT    Recommendations for Other Services       Precautions / Restrictions Precautions Precautions: Knee      Mobility  Bed Mobility Overal bed mobility: Needs Assistance Bed Mobility: Supine to Sit     Supine to sit: Min assist     General bed mobility comments: support left leg, cues for technique  Transfers Overall transfer level: Needs assistance Equipment used: Rolling walker (2 wheeled) Transfers: Sit to/from Stand Sit to Stand: Min assist         General transfer comment: cues for left leg position  Ambulation/Gait Ambulation/Gait assistance: Min assist Ambulation Distance (Feet): 70 Feet Assistive device: Rolling walker (2 wheeled) Gait Pattern/deviations: Step-to pattern;Step-through pattern     General Gait Details: cues for sequence  Stairs            Wheelchair Mobility    Modified Rankin (Stroke Patients Only)       Balance                                             Pertinent Vitals/Pain Pain Assessment: 0-10 Pain Score: 2  Pain Location: left knee Pain Descriptors / Indicators: Discomfort Pain Intervention(s): Premedicated before session;Repositioned;Ice applied    Home Living Family/patient expects to be discharged to:: Private residence Living Arrangements: Spouse/significant other Available Help  at Discharge: Family Type of Home: House Home Access: Stairs to enter Entrance Stairs-Rails: Can reach both Entrance Stairs-Number of Steps: 5 Home Layout: One level Home Equipment: [place] - 2 wheels;Cane - single point      Prior Function Level of Independence: Independent               Hand Dominance        Extremity/Trunk Assessment   Upper Extremity Assessment Upper Extremity Assessment: Defer to OT evaluation    Lower Extremity Assessment Lower Extremity Assessment: LLE deficits/detail LLE Deficits / Details: + SLR, knee flexion 60    Cervical / Trunk Assessment Cervical / Trunk Assessment: Normal  Communication   Communication: No difficulties  Cognition Arousal/Alertness: Awake/alert   Overall Cognitive Status: Within Functional Limits for tasks assessed                                        General Comments      Exercises     Assessment/Plan    PT Assessment Patient needs continued PT services  PT Problem List Decreased strength;Decreased range of motion;Decreased activity tolerance;Decreased mobility;Decreased knowledge of precautions;Decreased safety awareness;Decreased knowledge of use of DME;Pain       PT Treatment Interventions DME instruction;Gait training;Stair training;Functional mobility training;Therapeutic activities;Patient/family education    PT Goals (Current goals can be found  in the Care Plan section)  Acute Rehab PT Goals Patient Stated Goal: walk PT Goal Formulation: With patient/family Time For Goal Achievement: 11/22/16 Potential to Achieve Goals: Good    Frequency 7X/week   Barriers to discharge        Co-evaluation               AM-PAC PT "6 Clicks" Daily Activity  Outcome Measure Difficulty turning over in bed (including adjusting bedclothes, sheets and blankets)?: Total Difficulty moving from lying on back to sitting on the side of the bed? : Total Difficulty sitting down on and  standing up from a chair with arms (e.g., wheelchair, bedside commode, etc,.)?: Total Help needed moving to and from a bed to chair (including a wheelchair)?: A Lot Help needed walking in hospital room?: A Lot Help needed climbing 3-5 steps with a railing? : A Lot 6 Click Score: 9    End of Session   Activity Tolerance: Patient tolerated treatment well Patient left: in chair;with call bell/phone within reach Nurse Communication: Mobility status PT Visit Diagnosis: Difficulty in walking, not elsewhere classified (R26.2);Pain Pain - Right/Left: Left Pain - part of body: Knee    Time: 8937-3428 PT Time Calculation (min) (ACUTE ONLY): 26 min   Charges:   PT Evaluation $PT Eval Low Complexity: 1 Procedure PT Treatments $Gait Training: 8-22 mins   PT G CodesTresa Endo PT [phone] }  [name] 11/19/2016, 6:46 PM

## 2016-11-19 NOTE — Transfer of Care (Signed)
Immediate Anesthesia Transfer of Care Note  Patient: Dawn [name]  Procedure(s) Performed: Procedure(s) with comments: LEFT TOTAL KNEE ARTHROPLASTY (Left) - 70 mins  Patient Location: PACU  Anesthesia Type:MAC, Regional and Spinal  Level of Consciousness: awake, alert , oriented and patient cooperative  Airway & Oxygen Therapy: Patient Spontanous Breathing and Patient connected to face mask oxygen  Post-op Assessment: Report given to RN and Post -op Vital signs reviewed and stable  Post vital signs: Reviewed and stable  Last Vitals:  Vitals:   11/19/16 0934 11/19/16 0936  BP:    Pulse: 72 72  Resp:    Temp:      Last Pain:  Vitals:   11/19/16 0741  TempSrc: Oral      Patients Stated Pain Goal: 4 (69/67/89 3810)  Complications: No apparent anesthesia complications

## 2016-11-19 NOTE — Interval H&P Note (Signed)
History and Physical Interval Note:  11/19/2016 8:43 AM  Dawn [name]  has presented today for surgery, with the diagnosis of Left knee osteoarthritis  The various methods of treatment have been discussed with the patient and family. After consideration of risks, benefits and other options for treatment, the patient has consented to  Procedure(s) with comments: LEFT TOTAL KNEE ARTHROPLASTY (Left) - 70 mins as a surgical intervention .  The patient's history has been reviewed, patient examined, no change in status, stable for surgery.  I have reviewed the patient's chart and labs.  Questions were answered to the patient's satisfaction.     Mauri Pole

## 2016-11-19 NOTE — Anesthesia Procedure Notes (Signed)
Anesthesia Regional Block: Adductor canal block   Pre-Anesthetic Checklist: ,, timeout performed, Correct Patient, Correct Site, Correct Laterality, Correct Procedure, Correct Position, site marked, Risks and benefits discussed,  Surgical consent,  Pre-op evaluation,  At surgeon's request and post-op pain management  Laterality: Left  Prep: chloraprep       Needles:  Injection technique: Single-shot  Needle Type: Echogenic Needle     Needle Length: 9cm  Needle Gauge: 21     Additional Needles:   Procedures: ultrasound guided,,,,,,,,  Narrative:  Start time: 11/19/2016 9:00 AM End time: 11/19/2016 9:05 AM Injection made incrementally with aspirations every 5 mL.  Performed by: Personally  Anesthesiologist: Suzette Battiest

## 2016-11-19 NOTE — Progress Notes (Signed)
Assisted Dr. Rob Fitzgerald with left, ultrasound guided, adductor canal block. Side rails up, monitors on throughout procedure. See vital signs in flow sheet. Tolerated Procedure well.  

## 2016-11-19 NOTE — Anesthesia Postprocedure Evaluation (Signed)
Anesthesia Post Note  Patient: Dawn [name]  Procedure(s) Performed: Procedure(s) (LRB): LEFT TOTAL KNEE ARTHROPLASTY (Left)     Patient location during evaluation: PACU Anesthesia Type: Spinal Level of consciousness: awake and alert Pain management: pain level controlled Vital Signs Assessment: post-procedure vital signs reviewed and stable Respiratory status: spontaneous breathing and respiratory function stable Cardiovascular status: blood pressure returned to baseline and stable Postop Assessment: spinal receding Anesthetic complications: no    Last Vitals:  Vitals:   11/19/16 1245 11/19/16 1300  BP: 120/74 128/74  Pulse: 74 77  Resp: 16 16  Temp:  (!) 36.3 C    Last Pain:  Vitals:   11/19/16 1245  TempSrc:   PainSc: 0-No pain                 Tiajuana Amass

## 2016-11-19 NOTE — Anesthesia Procedure Notes (Signed)
Spinal  Patient location during procedure: OR Start time: 11/19/2016 10:10 AM End time: 11/19/2016 10:15 AM Staffing Anesthesiologist: Suzette Battiest Performed: anesthesiologist  Preanesthetic Checklist Completed: patient identified, site marked, surgical consent, pre-op evaluation, timeout performed, IV checked, risks and benefits discussed and monitors and equipment checked Spinal Block Patient position: sitting Prep: site prepped and draped and DuraPrep Patient monitoring: blood pressure, continuous pulse ox and heart rate Approach: midline Location: L4-5 Injection technique: single-shot Needle Needle type: Pencan  Needle gauge: 24 G Needle length: 9 cm Needle insertion depth: 7 cm

## 2016-11-19 NOTE — Anesthesia Procedure Notes (Addendum)
Procedure Name: MAC Date/Time: 11/19/2016 10:16 AM Performed by: Dione Booze Pre-anesthesia Checklist: Patient identified, Emergency Drugs available, Suction available and Patient being monitored Patient Re-evaluated:Patient Re-evaluated prior to inductionOxygen Delivery Method: Simple face mask Placement Confirmation: positive ETCO2

## 2016-11-19 NOTE — Op Note (Signed)
NAME:  Dawn [name]                      MEDICAL RECORD NO.:  [id number]                             FACILITY:  [facility]      PHYSICIAN:  Pietro Cassis. Alvan Dame, M.D.  DATE OF BIRTH:  [birth year]      DATE OF PROCEDURE:  11/19/2016                                     OPERATIVE REPORT         PREOPERATIVE DIAGNOSIS:  Left knee osteoarthritis.      POSTOPERATIVE DIAGNOSIS:  Left knee osteoarthritis.      FINDINGS:  The patient was noted to have complete loss of cartilage and   bone-on-bone arthritis with associated osteophytes in the medial and patellofemoral compartments of   the knee with a significant synovitis and associated effusion.      PROCEDURE:  Left total knee replacement.      COMPONENTS USED:  DePuy Attune rotating platform posterior stabilized knee   system, a size 5 femur, 5 tibia, size 5 PS AOX insert, and 35 anatomic patellar   button.      SURGEON:  Pietro Cassis. Alvan Dame, M.D.      ASSISTANT:  Danae Orleans, PA-C.      ANESTHESIA:  Regional and Spinal.      SPECIMENS:  None.      COMPLICATION:  None.      DRAINS:  None.  EBL: <100cc      TOURNIQUET TIME:   Total Tourniquet Time Documented: Thigh (Left) - 27 minutes Total: Thigh (Left) - 27 minutes  .      The patient was stable to the recovery room.      INDICATION FOR PROCEDURE:  Dawn [name] is a 66 y.o. female patient of   mine.  The patient had been seen, evaluated, and treated conservatively in the   office with medication, activity modification, and injections.  The patient had   radiographic changes of bone-on-bone arthritis with endplate sclerosis and osteophytes noted.      The patient failed conservative measures including medication, injections, and activity modification, and at this point was ready for more definitive measures.   Based on the radiographic changes and failed conservative measures, the patient   decided to proceed with total knee replacement.  Risks of infection,   DVT,  component failure, need for revision surgery, postop course, and   expectations were all   discussed and reviewed.  Consent was obtained for benefit of pain   relief.      PROCEDURE IN DETAIL:  The patient was brought to the operative theater.   Once adequate anesthesia, preoperative antibiotics, 2 gm of Ancef, 1 gm of Tranexamic Acid, and 10 mg of Decadron administered, the patient was positioned supine with the left thigh tourniquet placed.  The  left lower extremity was prepped and draped in sterile fashion.  A time-   out was performed identifying the patient, planned procedure, and   extremity.      The left lower extremity was placed in the [facility] leg holder.  The leg was   exsanguinated, tourniquet elevated to 250 mmHg.  A midline incision was  made followed by median parapatellar arthrotomy.  Following initial   exposure, attention was first directed to the patella.  Precut   measurement was noted to be 22 mm.  I resected down to 14 mm and used a   35 anatomic patellar button to restore patellar height as well as cover the cut   surface.      The lug holes were drilled and a metal shim was placed to protect the   patella from retractors and saw blades.      At this point, attention was now directed to the femur.  The femoral   canal was opened with a drill, irrigated to try to prevent fat emboli.  An   intramedullary rod was passed at 3 degrees valgus, 9 mm of bone was   resected off the distal femur.  Following this resection, the tibia was   subluxated anteriorly.  Using the extramedullary guide, 2 mm of bone was resected off   the proximal medial tibia.  We confirmed the gap would be   stable medially and laterally with a size 5 spacer block as well as confirmed   the cut was perpendicular in the coronal plane, checking with an alignment rod.      Once this was done, I sized the femur to be a size 5 in the anterior-   posterior dimension, chose a standard component based  on medial and   lateral dimension.  The size 5 rotation block was then pinned in   position anterior referenced using the C-clamp to set rotation.  The   anterior, posterior, and  chamfer cuts were made without difficulty nor   notching making certain that I was along the anterior cortex to help   with flexion gap stability.      The final box cut was made off the lateral aspect of distal femur.      At this point, the tibia was sized to be a size 5, the size 5 tray was   then pinned in position through the medial third of the tubercle,   drilled, and keel punched.  Trial reduction was now carried with a 5 femur,  5 tibia, a size 6 PS insert, and the 35 anatomic patella botton.  The knee was brought to   extension, full extension with good flexion stability with the patella   tracking through the trochlea without application of pressure.  Given   all these findings the femoral lug holes were drilled and then the trial components removed.  Final components were   opened and cement was mixed.  The knee was irrigated with normal saline   solution and pulse lavage.  The synovial lining was   then injected with 30 cc of 0.25% Marcaine with epinephrine and 1 cc of Toradol plus 30 cc of NS for a  total of 61 cc.      The knee was irrigated.  Final implants were then cemented onto clean and   dried cut surfaces of bone with the knee brought to extension with a size 6 PS trial insert.      Once the cement had fully cured, the excess cement was removed   throughout the knee.  I confirmed I was satisfied with the range of   motion and stability, and the final size 6 PS AOX insert was chosen.  It was   placed into the knee.      The tourniquet had been let down at 27 minutes.  No significant   hemostasis required.  The   extensor mechanism was then reapproximated using #1 Vicryl and #0 V-lock sutures with the knee   in flexion.  The   remaining wound was closed with 2-0 Vicryl and running 4-0  Monocryl.   The knee was cleaned, dried, dressed sterilely using Dermabond and   Aquacel dressing.  The patient was then   brought to recovery room in stable condition, tolerating the procedure   well.   Please note that Physician Assistant, Danae Orleans, PA-C, was present for the entirety of the case, and was utilized for pre-operative positioning, peri-operative retractor management, general facilitation of the procedure.  He was also utilized for primary wound closure at the end of the case.              Pietro Cassis Alvan Dame, M.D.    11/19/2016 11:28 AM

## 2016-11-19 NOTE — Discharge Instructions (Signed)

## 2016-11-19 NOTE — Anesthesia Preprocedure Evaluation (Addendum)
Anesthesia Evaluation  Patient identified by MRN, date of birth, ID band Patient awake    Reviewed: Allergy & Precautions, NPO status , Patient's Chart, lab work & pertinent test results  Airway Mallampati: II  TM Distance: >3 FB Neck ROM: Full    Dental   Pulmonary former smoker,    breath sounds clear to auscultation       Cardiovascular hypertension, Pt. on medications  Rhythm:Regular Rate:Normal     Neuro/Psych negative neurological ROS     GI/Hepatic negative GI ROS, Neg liver ROS,   Endo/Other  negative endocrine ROS  Renal/GU negative Renal ROS     Musculoskeletal  (+) Arthritis ,   Abdominal   Peds  Hematology negative hematology ROS (+)   Anesthesia Other Findings   Reproductive/Obstetrics                            Lab Results  Component Value Date   WBC 5.6 11/14/2016   HGB 14.5 11/14/2016   HCT 43.3 11/14/2016   MCV 92.5 11/14/2016   PLT 269 11/14/2016   Lab Results  Component Value Date   CREATININE 0.82 11/14/2016   BUN 19 11/14/2016   NA 141 11/14/2016   K 4.5 11/14/2016   CL 106 11/14/2016   CO2 26 11/14/2016    Anesthesia Physical Anesthesia Plan  ASA: II  Anesthesia Plan: Spinal   Post-op Pain Management:  Regional for Post-op pain   Induction: Intravenous  PONV Risk Score and Plan: 3 and Ondansetron, Dexamethasone, Propofol and Midazolam  Airway Management Planned: Natural Airway and Simple Face Mask  Additional Equipment:   Intra-op Plan:   Post-operative Plan:   Informed Consent: I have reviewed the patients History and Physical, chart, labs and discussed the procedure including the risks, benefits and alternatives for the proposed anesthesia with the patient or authorized representative who has indicated his/her understanding and acceptance.     Plan Discussed with: CRNA  Anesthesia Plan Comments:        Anesthesia Quick  Evaluation

## 2016-11-20 DIAGNOSIS — E669 Obesity, unspecified: Secondary | ICD-10-CM | POA: Diagnosis present

## 2016-11-20 LAB — CBC
HEMATOCRIT: 38.1 % (ref 36.0–46.0)
HEMOGLOBIN: 12.9 g/dL (ref 12.0–15.0)
MCH: 31.7 pg (ref 26.0–34.0)
MCHC: 33.9 g/dL (ref 30.0–36.0)
MCV: 93.6 fL (ref 78.0–100.0)
Platelets: 278 10*3/uL (ref 150–400)
RBC: 4.07 MIL/uL (ref 3.87–5.11)
RDW: 13 % (ref 11.5–15.5)
WBC: 12.8 10*3/uL — ABNORMAL HIGH (ref 4.0–10.5)

## 2016-11-20 LAB — BASIC METABOLIC PANEL
Anion gap: 9 (ref 5–15)
BUN: 15 mg/dL (ref 6–20)
CALCIUM: 9 mg/dL (ref 8.9–10.3)
CHLORIDE: 106 mmol/L (ref 101–111)
CO2: 26 mmol/L (ref 22–32)
CREATININE: 0.64 mg/dL (ref 0.44–1.00)
GFR calc non Af Amer: >60 mL/min (ref >60)
GLUCOSE: 142 mg/dL — AB (ref 65–99)
Potassium: 4.5 mmol/L (ref 3.5–5.1)
Sodium: 141 mmol/L (ref 135–145)

## 2016-11-20 NOTE — Progress Notes (Signed)
Physical Therapy Treatment Patient Details Name: Dawn [name] MRN: [id number] DOB: 28-Aug-1950 Today's Date: 11/20/2016    History of Present Illness L TKA    PT Comments    The patient is progressing well. Plans Dc tomorrow.  Follow Up Recommendations  Outpatient PT     Equipment Recommendations  None recommended by PT    Recommendations for Other Services       Precautions / Restrictions Precautions Precautions: Knee Restrictions Weight Bearing Restrictions: No    Mobility  Bed Mobility               General bed mobility comments: oob  Transfers Overall transfer level: Needs assistance Equipment used: Rolling walker (2 wheeled) Transfers: Sit to/from Stand Sit to Stand: Min guard         General transfer comment: for safety.  cues for UE/LE placement  Ambulation/Gait Ambulation/Gait assistance: Min guard Ambulation Distance (Feet): 200 Feet Assistive device: Rolling walker (2 wheeled) Gait Pattern/deviations: Step-to pattern;Step-through pattern     General Gait Details: cues for sequence   Stairs            Wheelchair Mobility    Modified Rankin (Stroke Patients Only)       Balance                                            Cognition Arousal/Alertness: Awake/alert Behavior During Therapy: WFL for tasks assessed/performed Overall Cognitive Status: Within Functional Limits for tasks assessed                                        Exercises Total Joint Exercises Ankle Circles/Pumps: AROM;Both;10 reps Quad Sets: AROM;Both Towel Squeeze: AROM;Left;10 reps Short Arc Quad: AROM;Left;10 reps Heel Slides: AROM;Left;10 reps Hip ABduction/ADduction: AROM;Left;10 reps Straight Leg Raises: AROM;Left;10 reps Long Arc Quad: AROM;Left;10 reps Knee Flexion: AROM;Left;10 reps Goniometric ROM: 10-80 left knee flexion    General Comments        Pertinent Vitals/Pain Pain Assessment: 0-10 Pain  Score: 2  Pain Location: thigh Pain Descriptors / Indicators: Aching Pain Intervention(s): Monitored during session;Premedicated before session;Ice applied    Home Living Family/patient expects to be discharged to:: Private residence Living Arrangements: Spouse/significant other Available Help at Discharge: Family         Home Equipment: Bedside commode;Shower seat      Prior Function Level of Independence: Independent          PT Goals (current goals can now be found in the care plan section) Acute Rehab PT Goals Patient Stated Goal: walk Progress towards PT goals: Progressing toward goals    Frequency    7X/week      PT Plan Current plan remains appropriate    Co-evaluation              AM-PAC PT "6 Clicks" Daily Activity  Outcome Measure  Difficulty turning over in bed (including adjusting bedclothes, sheets and blankets)?: Total Difficulty moving from lying on back to sitting on the side of the bed? : Total Difficulty sitting down on and standing up from a chair with arms (e.g., wheelchair, bedside commode, etc,.)?: Total Help needed moving to and from a bed to chair (including a wheelchair)?: A Lot Help needed walking in hospital room?: A Lot Help needed climbing  3-5 steps with a railing? : A Lot 6 Click Score: 9    End of Session   Activity Tolerance: Patient tolerated treatment well Patient left: in chair;with call bell/phone within reach Nurse Communication: Mobility status PT Visit Diagnosis: Difficulty in walking, not elsewhere classified (R26.2);Pain Pain - Right/Left: Left Pain - part of body: Knee     Time: 0932-1028 PT Time Calculation (min) (ACUTE ONLY): 56 min  Charges:  $Gait Training: 23-37 mins $Therapeutic Exercise: 23-37 mins $Self Care/Home Management: 8-22                    G CodesTresa [name] PT [phone]    Dawn [name] 11/20/2016, 4:38 PM

## 2016-11-20 NOTE — Progress Notes (Signed)
Discharge planning, plan is for OP PT. Patient needs RW, declines 3n1, contacted [facility] to deliver to the room. [phone]

## 2016-11-20 NOTE — Evaluation (Signed)
Occupational Therapy Evaluation Patient Details Name: Dawn [name] MRN: [id number] DOB: [birth year] Today's Date: 11/20/2016    History of Present Illness L TKA   Clinical Impression   This 66 year old female was admitted for the above sx. All education was completed. No further OT is needed at this time    Follow Up Recommendations  Supervision/Assistance - 24 hour    Equipment Recommendations  None recommended by OT    Recommendations for Other Services       Precautions / Restrictions Precautions Precautions: Knee Restrictions Weight Bearing Restrictions: No      Mobility Bed Mobility               General bed mobility comments: oob  Transfers   Equipment used: Rolling walker (2 wheeled)   Sit to Stand: Min guard         General transfer comment: for safety.  cues for UE/LE placement    Balance                                           ADL either performed or assessed with clinical judgement   ADL Overall ADL's : Needs assistance/impaired Eating/Feeding: Independent   Grooming: Supervision/safety;Standing   Upper Body Bathing: Set up;Sitting   Lower Body Bathing: Moderate assistance;Sit to/from stand   Upper Body Dressing : Set up;Sitting   Lower Body Dressing: Maximal assistance;Sit to/from stand   Toilet Transfer: Min guard;Ambulation;BSC;RW   Toileting- Water quality scientist and Hygiene: Min guard;Sit to/from stand   Tub/ Shower Transfer: Walk-in shower;Min guard;Ambulation;3 in 1     General ADL Comments: Practiced shower transfer.  Husband will assist pt as needed. She sometimes had difficulty with sequencing, but she is WBAT.       Vision         Perception     Praxis      Pertinent Vitals/Pain Pain Assessment: 0-10 Pain Score: 2  Pain Location: thigh Pain Descriptors / Indicators: Aching Pain Intervention(s): Limited activity within patient's tolerance;Monitored during session;Premedicated  before session;Repositioned;Ice applied (heat to thigh)     Hand Dominance     Extremity/Trunk Assessment Upper Extremity Assessment Upper Extremity Assessment: Generalized weakness           Communication Communication Communication: No difficulties   Cognition Arousal/Alertness: Awake/alert Behavior During Therapy: WFL for tasks assessed/performed Overall Cognitive Status: Within Functional Limits for tasks assessed                                     General Comments       Exercises     Shoulder Instructions      Home Living Family/patient expects to be discharged to:: Private residence Living Arrangements: Spouse/significant other Available Help at Discharge: Family               Bathroom Shower/Tub: Walk-in Corporate treasurer Toilet: Handicapped height     Home Equipment: Bedside commode;Shower seat          Prior Functioning/Environment Level of Independence: Independent                 OT Problem List:        OT Treatment/Interventions:      OT Goals(Current goals can be found in the care plan section) Acute Rehab  OT Goals Patient Stated Goal: walk OT Goal Formulation: All assessment and education complete, DC therapy  OT Frequency:     Barriers to D/C:            Co-evaluation              AM-PAC PT "6 Clicks" Daily Activity     Outcome Measure Help from another person eating meals?: None Help from another person taking care of personal grooming?: A Little Help from another person toileting, which includes using toliet, bedpan, or urinal?: A Little Help from another person bathing (including washing, rinsing, drying)?: A Lot Help from another person to put on and taking off regular upper body clothing?: A Little Help from another person to put on and taking off regular lower body clothing?: A Lot 6 Click Score: 17   End of Session    Activity Tolerance: Patient tolerated treatment well Patient left:  in chair;with call bell/phone within reach  OT Visit Diagnosis: Pain Pain - Right/Left: Left Pain - part of body: Leg (thigh)                Time: 6151-8343 OT Time Calculation (min): 22 min Charges:  OT General Charges $OT Visit: 1 Procedure OT Evaluation $OT Eval Low Complexity: 1 Procedure G-Codes:     Lesle Chris, OTR/L [phone] 11/20/2016  Dawn [name] 11/20/2016, 3:36 PM

## 2016-11-20 NOTE — Progress Notes (Signed)
     Subjective: 1 Day Post-Op Procedure(s) (LRB): LEFT TOTAL KNEE ARTHROPLASTY (Left)   Patient reports pain as mild, pain controlled. No events throughout the night. Slept in the chair, which was more comfortable for her.  Looking forward to working with PT. Plan for discharge tomorrow due to need for inpatient therapy to meet goal of being discharged home safely with family/caregiver.  Objective:   VITALS:   Vitals:   11/20/16 0113 11/20/16 0530  BP: (!) 114/59 111/67  Pulse: 75 66  Resp: 17 18  Temp: (!) 97.5 F (36.4 C) 97.7 F (36.5 C)    Dorsiflexion/Plantar flexion intact Incision: dressing C/D/I No cellulitis present Compartment soft  LABS  Recent Labs  11/20/16 0533  HGB 12.9  HCT 38.1  WBC 12.8*  PLT 278     Recent Labs  11/20/16 0533  NA 141  K 4.5  BUN 15  CREATININE 0.64  GLUCOSE 142*     Assessment/Plan: 1 Day Post-Op Procedure(s) (LRB): LEFT TOTAL KNEE ARTHROPLASTY (Left) Foley cath d/c'ed Advance diet Up with therapy D/C IV fluids Discharge home eventually, when ready  Obese (BMI 30-39.9) Estimated body mass index is 36.41 kg/m as calculated from the following:   Height as of this encounter: 5' 8.5" (1.74 m).   Weight as of this encounter: 110.2 kg (243 lb). Patient also counseled that weight may inhibit the healing process Patient counseled that losing weight will help with future health issues      [place] Pugh. Andrea Colglazier   PAC  11/20/2016, 9:23 AM

## 2016-11-20 NOTE — Progress Notes (Signed)
Physical Therapy Treatment Patient Details Name: Dawn [name] MRN: [id number] DOB: 02-21-1951 Today's Date: 11/20/2016    History of Present Illness L TKA    PT Comments    Instructed in use of ICE machine for home.  Plans Dc tomorrow,.  Follow Up Recommendations  Outpatient PT     Equipment Recommendations  None recommended by PT    Recommendations for Other Services       Precautions / Restrictions Precautions Precautions: Knee Restrictions Weight Bearing Restrictions: No    Mobility  Bed Mobility               General bed mobility comments: oob  Transfers Overall transfer level: Needs assistance Equipment used: Rolling walker (2 wheeled) Transfers: Sit to/from Stand Sit to Stand: Min guard         General transfer comment: for safety.  cues for UE/LE placement  Ambulation/Gait Ambulation/Gait assistance: Supervision Ambulation Distance (Feet): 200 Feet Assistive device: Rolling walker (2 wheeled) Gait Pattern/deviations: Step-to pattern;Step-through pattern     General Gait Details: cues for sequence   Stairs            Wheelchair Mobility    Modified Rankin (Stroke Patients Only)       Balance                                            Cognition Arousal/Alertness: Awake/alert Behavior During Therapy: WFL for tasks assessed/performed Overall Cognitive Status: Within Functional Limits for tasks assessed                                        Exercises    General Comments        Pertinent Vitals/Pain Pain Assessment: 0-10 Pain Score: 2  Pain Location: thigh Pain Descriptors / Indicators: Aching Pain Intervention(s): Monitored during session;Premedicated before session;Repositioned;Patient requesting pain meds-RN notified;Ice applied    Home Living Family/patient expects to be discharged to:: Private residence Living Arrangements: Spouse/significant other Available Help at  Discharge: Family         Home Equipment: Bedside commode;Shower seat      Prior Function Level of Independence: Independent          PT Goals (current goals can now be found in the care plan section) Acute Rehab PT Goals Patient Stated Goal: walk Progress towards PT goals: Progressing toward goals    Frequency    7X/week      PT Plan Current plan remains appropriate    Co-evaluation              AM-PAC PT "6 Clicks" Daily Activity  Outcome Measure  Difficulty turning over in bed (including adjusting bedclothes, sheets and blankets)?: A Little Difficulty moving from lying on back to sitting on the side of the bed? : A Little Difficulty sitting down on and standing up from a chair with arms (e.g., wheelchair, bedside commode, etc,.)?: A Little Help needed moving to and from a bed to chair (including a wheelchair)?: A Lot Help needed walking in hospital room?: A Lot Help needed climbing 3-5 steps with a railing? : A Lot 6 Click Score: 15    End of Session   Activity Tolerance: Patient tolerated treatment well Patient left: in chair;with call bell/phone within reach Nurse Communication: Mobility  status PT Visit Diagnosis: Difficulty in walking, not elsewhere classified (R26.2);Pain Pain - Right/Left: Left Pain - part of body: Knee     Time: 8264-1583 PT Time Calculation (min) (ACUTE ONLY): 42 min  Charges:  $Gait Training: 8-22 mins $Therapeutic Exercise: 8-22 mins $Self Care/Home Management: 8-22                    G CodesTresa Endo PT [phone]    [name] 11/20/2016, 4:41 PM

## 2016-11-21 ENCOUNTER — Telehealth: Payer: Self-pay | Admitting: *Deleted

## 2016-11-21 LAB — BASIC METABOLIC PANEL
ANION GAP: 5 (ref 5–15)
BUN: 25 mg/dL — ABNORMAL HIGH (ref 6–20)
CALCIUM: 8.5 mg/dL — AB (ref 8.9–10.3)
CO2: 26 mmol/L (ref 22–32)
Chloride: 108 mmol/L (ref 101–111)
Creatinine, Ser: 0.85 mg/dL (ref 0.44–1.00)
Glucose, Bld: 126 mg/dL — ABNORMAL HIGH (ref 65–99)
Potassium: 4.5 mmol/L (ref 3.5–5.1)
SODIUM: 139 mmol/L (ref 135–145)

## 2016-11-21 LAB — CBC
HEMATOCRIT: 33.3 % — AB (ref 36.0–46.0)
Hemoglobin: 11.4 g/dL — ABNORMAL LOW (ref 12.0–15.0)
MCH: 32.3 pg (ref 26.0–34.0)
MCHC: 34.2 g/dL (ref 30.0–36.0)
MCV: 94.3 fL (ref 78.0–100.0)
PLATELETS: 269 10*3/uL (ref 150–400)
RBC: 3.53 MIL/uL — ABNORMAL LOW (ref 3.87–5.11)
RDW: 13.3 % (ref 11.5–15.5)
WBC: 10.7 10*3/uL — AB (ref 4.0–10.5)

## 2016-11-21 NOTE — Progress Notes (Signed)
RN reviewed discharge instructions with patient and family. All questions answered.   Paperwork and prescriptions given.   NT rolled patient down with all belongings to family car. 

## 2016-11-21 NOTE — Telephone Encounter (Signed)
Pt was on TCM list admitted 11/19/16 for surgery (L) knee replacement. Will be f/u w/specialsit Dr. Alvan Dame...Dawn [name]

## 2016-11-21 NOTE — Progress Notes (Signed)
Physical Therapy Treatment Patient Details Name: Dawn [name] MRN: [id number] DOB: 05-07-1951 Today's Date: 11/21/2016    History of Present Illness L TKA    PT Comments    Pt performed well today and was tolerant of all treatment. Exercises as well as stair mobility instruction was provided. Pt received handouts with exercises and stair mobility guidelines as well. Pt had no further questions and was scheduled to continue with outpatient PT on Monday July 16th.  Follow Up Recommendations  Outpatient PT     Equipment Recommendations  None recommended by PT    Recommendations for Other Services       Precautions / Restrictions Precautions Precautions: Knee Restrictions Weight Bearing Restrictions: No    Mobility  Bed Mobility Overal bed mobility: Needs Assistance Bed Mobility: Supine to Sit     Supine to sit: Min assist        Transfers Overall transfer level: Needs assistance Equipment used: Rolling walker (2 wheeled) Transfers: Sit to/from Stand Sit to Stand: Min guard         General transfer comment: for safety.  cues for UE/LE placement  Ambulation/Gait Ambulation/Gait assistance: Supervision Ambulation Distance (Feet): 200 Feet Assistive device: Rolling walker (2 wheeled) Gait Pattern/deviations: Step-through pattern;Decreased stride length;Decreased stance time - left     General Gait Details: Pt performed well and reports minimal pain   Stairs Stairs: Yes   Stair Management: One rail Left;Backwards;With walker Number of Stairs: 2 General stair comments: Pt practiced ascending and descending stairs with and without a railing. Pt performed forward and backward stair mobility under supervision for safety  Wheelchair Mobility    Modified Rankin (Stroke Patients Only)       Balance                                            Cognition Arousal/Alertness: Awake/alert Behavior During Therapy: WFL for tasks  assessed/performed Overall Cognitive Status: Within Functional Limits for tasks assessed                                        Exercises Total Joint Exercises Ankle Circles/Pumps: AROM;Both;10 reps Quad Sets: AROM;Both Towel Squeeze: AROM;Left;10 reps Heel Slides: AROM;Left;10 reps Hip ABduction/ADduction: AROM;Left;10 reps Long Arc Quad: AROM;Left;10 reps Knee Flexion: AROM;Left;10 reps    General Comments        Pertinent Vitals/Pain Pain Assessment: 0-10 Pain Score: 3  Pain Location: thigh Pain Descriptors / Indicators: Aching Pain Intervention(s): Monitored during session;Repositioned;Premedicated before session    Home Living                      Prior Function            PT Goals (current goals can now be found in the care plan section) Acute Rehab PT Goals Patient Stated Goal: walk PT Goal Formulation: With patient/family Time For Goal Achievement: 11/22/16 Potential to Achieve Goals: Good Progress towards PT goals: Progressing toward goals    Frequency    7X/week      PT Plan Current plan remains appropriate    Co-evaluation              AM-PAC PT "6 Clicks" Daily Activity  Outcome Measure  Difficulty turning over in bed (including adjusting bedclothes,  sheets and blankets)?: None Difficulty moving from lying on back to sitting on the side of the bed? : A Little Difficulty sitting down on and standing up from a chair with arms (e.g., wheelchair, bedside commode, etc,.)?: A Little Help needed moving to and from a bed to chair (including a wheelchair)?: A Little Help needed walking in hospital room?: A Little Help needed climbing 3-5 steps with a railing? : A Little 6 Click Score: 19    End of Session Equipment Utilized During Treatment: Gait belt Activity Tolerance: Patient tolerated treatment well Patient left: in chair;with call bell/phone within reach Nurse Communication: Mobility status PT Visit Diagnosis:  Difficulty in walking, not elsewhere classified (R26.2);Pain Pain - Right/Left: Left Pain - part of body: Knee     Time: 8264-1583 PT Time Calculation (min) (ACUTE ONLY): 33 min  Charges:  $Gait Training: 8-22 mins $Therapeutic Exercise: 8-22 mins                    G Codes:       Olegario Shearer, SPT   Reino Bellis 11/21/2016, 1:41 PM

## 2016-11-21 NOTE — Progress Notes (Addendum)
     Subjective: 2 Days Post-Op Procedure(s) (LRB): LEFT TOTAL KNEE ARTHROPLASTY (Left)   Seen by Dr. Alvan Dame. Patient reports pain as mild, pain controlled. No events throughout the night. Ready to be discharged home.  Objective:   VITALS:   Vitals:   11/20/16 2131 11/21/16 0530  BP: 121/71 110/64  Pulse: 75 62  Resp: 17 18  Temp: 98.4 F (36.9 C) 98.6 F (37 C)    Dorsiflexion/Plantar flexion intact Incision: dressing C/D/I No cellulitis present Compartment soft  LABS  Recent Labs  11/20/16 0533 11/21/16 0504  HGB 12.9 11.4*  HCT 38.1 33.3*  WBC 12.8* 10.7*  PLT 278 269     Recent Labs  11/20/16 0533 11/21/16 0504  NA 141 139  K 4.5 4.5  BUN 15 25*  CREATININE 0.64 0.85  GLUCOSE 142* 126*     Assessment/Plan: 2 Days Post-Op Procedure(s) (LRB): LEFT TOTAL KNEE ARTHROPLASTY (Left) Up with therapy Discharge home Follow up in 2 weeks at [facility]. Follow up with Amandeep Nesmith D in 2 weeks.  Contact information:  [facility] [address] [place]   PAC  11/21/2016, 7:41 AM   Reports trying to be tough yesterday but learned her lesson, pain better controlled. Reviewed exercise/goals and post-op plan RTC in 2 weeks

## 2016-11-25 DIAGNOSIS — M25662 Stiffness of left knee, not elsewhere classified: Secondary | ICD-10-CM | POA: Diagnosis not present

## 2016-11-27 DIAGNOSIS — M25662 Stiffness of left knee, not elsewhere classified: Secondary | ICD-10-CM | POA: Diagnosis not present
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
## 2016-11-29 DIAGNOSIS — M25662 Stiffness of left knee, not elsewhere classified: Secondary | ICD-10-CM | POA: Diagnosis not present

## 2016-12-02 DIAGNOSIS — M25662 Stiffness of left knee, not elsewhere classified: Secondary | ICD-10-CM | POA: Diagnosis not present

## 2016-12-04 DIAGNOSIS — M25662 Stiffness of left knee, not elsewhere classified: Secondary | ICD-10-CM | POA: Diagnosis not present

## 2016-12-04 DIAGNOSIS — Z96652 Presence of left artificial knee joint: Secondary | ICD-10-CM | POA: Diagnosis not present

## 2016-12-04 DIAGNOSIS — Z471 Aftercare following joint replacement surgery: Secondary | ICD-10-CM | POA: Diagnosis not present

## 2016-12-06 DIAGNOSIS — M25662 Stiffness of left knee, not elsewhere classified: Secondary | ICD-10-CM | POA: Diagnosis not present

## 2016-12-10 DIAGNOSIS — M25662 Stiffness of left knee, not elsewhere classified: Secondary | ICD-10-CM | POA: Diagnosis not present

## 2016-12-13 DIAGNOSIS — M25662 Stiffness of left knee, not elsewhere classified: Secondary | ICD-10-CM | POA: Diagnosis not present

## 2016-12-16 DIAGNOSIS — M25662 Stiffness of left knee, not elsewhere classified: Secondary | ICD-10-CM | POA: Diagnosis not present

## 2016-12-18 DIAGNOSIS — M25662 Stiffness of left knee, not elsewhere classified: Secondary | ICD-10-CM | POA: Diagnosis not present

## 2016-12-24 DIAGNOSIS — M25662 Stiffness of left knee, not elsewhere classified: Secondary | ICD-10-CM | POA: Diagnosis not present

## 2016-12-26 DIAGNOSIS — M25662 Stiffness of left knee, not elsewhere classified: Secondary | ICD-10-CM | POA: Diagnosis not present

## 2016-12-31 ENCOUNTER — Telehealth: Payer: Self-pay | Admitting: Nurse Practitioner

## 2016-12-31 DIAGNOSIS — M25662 Stiffness of left knee, not elsewhere classified: Secondary | ICD-10-CM | POA: Diagnosis not present

## 2016-12-31 NOTE — Telephone Encounter (Signed)
LM for pt to schedule a Welcome to Medicare visit with Baldo Ash before October 1st.

## 2017-01-02 DIAGNOSIS — Z471 Aftercare following joint replacement surgery: Secondary | ICD-10-CM | POA: Diagnosis not present

## 2017-01-02 DIAGNOSIS — M25662 Stiffness of left knee, not elsewhere classified: Secondary | ICD-10-CM | POA: Diagnosis not present

## 2017-01-02 DIAGNOSIS — Z96652 Presence of left artificial knee joint: Secondary | ICD-10-CM | POA: Diagnosis not present

## 2017-01-03 DIAGNOSIS — M25662 Stiffness of left knee, not elsewhere classified: Secondary | ICD-10-CM | POA: Diagnosis not present

## 2017-01-06 DIAGNOSIS — M25662 Stiffness of left knee, not elsewhere classified: Secondary | ICD-10-CM | POA: Diagnosis not present

## 2017-01-09 DIAGNOSIS — M25662 Stiffness of left knee, not elsewhere classified: Secondary | ICD-10-CM | POA: Diagnosis not present

## 2017-01-14 DIAGNOSIS — M25662 Stiffness of left knee, not elsewhere classified: Secondary | ICD-10-CM | POA: Diagnosis not present

## 2017-01-17 DIAGNOSIS — M25662 Stiffness of left knee, not elsewhere classified: Secondary | ICD-10-CM | POA: Diagnosis not present

## 2017-01-31 ENCOUNTER — Telehealth: Payer: Self-pay | Admitting: Nurse Practitioner

## 2017-01-31 NOTE — Telephone Encounter (Signed)
Pt called in and wanted to know if you would take her on as a new pt.  She was Dawn [name] pt and her husband is already your pt

## 2017-01-31 NOTE — Telephone Encounter (Signed)
Yes, I will see her 

## 2017-02-04 NOTE — Telephone Encounter (Signed)
Called pt about making appt, she is going to get in front of her calandar and call back to make appt

## 2017-02-14 DIAGNOSIS — Z96652 Presence of left artificial knee joint: Secondary | ICD-10-CM | POA: Diagnosis not present

## 2017-02-14 DIAGNOSIS — M1711 Unilateral primary osteoarthritis, right knee: Secondary | ICD-10-CM | POA: Diagnosis not present

## 2017-02-14 DIAGNOSIS — Z471 Aftercare following joint replacement surgery: Secondary | ICD-10-CM | POA: Diagnosis not present

## 2017-02-14 DIAGNOSIS — M25561 Pain in right knee: Secondary | ICD-10-CM | POA: Diagnosis not present

## 2017-03-04 ENCOUNTER — Ambulatory Visit: Payer: Medicare Other | Admitting: Internal Medicine

## 2017-03-17 ENCOUNTER — Ambulatory Visit (INDEPENDENT_AMBULATORY_CARE_PROVIDER_SITE_OTHER): Payer: Medicare Other | Admitting: Internal Medicine

## 2017-03-17 ENCOUNTER — Encounter: Payer: Self-pay | Admitting: Internal Medicine

## 2017-03-17 ENCOUNTER — Other Ambulatory Visit (INDEPENDENT_AMBULATORY_CARE_PROVIDER_SITE_OTHER): Payer: Medicare Other

## 2017-03-17 VITALS — BP 124/68 | HR 83 | Temp 97.9°F | Resp 16 | Ht 68.5 in | Wt 237.0 lb

## 2017-03-17 DIAGNOSIS — R7989 Other specified abnormal findings of blood chemistry: Secondary | ICD-10-CM

## 2017-03-17 DIAGNOSIS — R739 Hyperglycemia, unspecified: Secondary | ICD-10-CM | POA: Insufficient documentation

## 2017-03-17 DIAGNOSIS — R945 Abnormal results of liver function studies: Secondary | ICD-10-CM

## 2017-03-17 DIAGNOSIS — E785 Hyperlipidemia, unspecified: Secondary | ICD-10-CM

## 2017-03-17 DIAGNOSIS — I10 Essential (primary) hypertension: Secondary | ICD-10-CM

## 2017-03-17 DIAGNOSIS — Z23 Encounter for immunization: Secondary | ICD-10-CM

## 2017-03-17 DIAGNOSIS — D539 Nutritional anemia, unspecified: Secondary | ICD-10-CM

## 2017-03-17 LAB — COMPREHENSIVE METABOLIC PANEL
ALT: 31 U/L (ref 0–35)
AST: 22 U/L (ref 0–37)
Albumin: 4.4 g/dL (ref 3.5–5.2)
Alkaline Phosphatase: 78 U/L (ref 39–117)
BUN: 22 mg/dL (ref 6–23)
CHLORIDE: 105 meq/L (ref 96–112)
CO2: 27 meq/L (ref 19–32)
CREATININE: 0.77 mg/dL (ref 0.40–1.20)
Calcium: 10 mg/dL (ref 8.4–10.5)
GFR: 79.69 mL/min (ref >60.00)
GLUCOSE: 66 mg/dL — AB (ref 70–99)
POTASSIUM: 4.3 meq/L (ref 3.5–5.1)
SODIUM: 141 meq/L (ref 135–145)
Total Bilirubin: 0.5 mg/dL (ref 0.2–1.2)
Total Protein: 7.4 g/dL (ref 6.0–8.3)

## 2017-03-17 LAB — CBC WITH DIFFERENTIAL/PLATELET
BASOS PCT: 2.6 % (ref 0.0–3.0)
Basophils Absolute: 0.2 10*3/uL — ABNORMAL HIGH (ref 0.0–0.1)
EOS ABS: 0.2 10*3/uL (ref 0.0–0.7)
EOS PCT: 2.6 % (ref 0.0–5.0)
HCT: 43.8 % (ref 36.0–46.0)
Hemoglobin: 14.8 g/dL (ref 12.0–15.0)
LYMPHS ABS: 2 10*3/uL (ref 0.7–4.0)
Lymphocytes Relative: 32.1 % (ref 12.0–46.0)
MCHC: 33.7 g/dL (ref 30.0–36.0)
MCV: 91.8 fl (ref 78.0–100.0)
MONO ABS: 0.5 10*3/uL (ref 0.1–1.0)
Monocytes Relative: 8.1 % (ref 3.0–12.0)
NEUTROS PCT: 54.6 % (ref 43.0–77.0)
Neutro Abs: 3.3 10*3/uL (ref 1.4–7.7)
Platelets: 333 10*3/uL (ref 150.0–400.0)
RBC: 4.77 Mil/uL (ref 3.87–5.11)
RDW: 14.3 % (ref 11.5–15.5)
WBC: 6.1 10*3/uL (ref 4.0–10.5)

## 2017-03-17 LAB — LIPID PANEL
CHOLESTEROL: 230 mg/dL — AB (ref 0–200)
HDL: 63.5 mg/dL (ref >39.00)
LDL Cholesterol: 141 mg/dL — ABNORMAL HIGH (ref 0–99)
NONHDL: 166.18
Total CHOL/HDL Ratio: 4
Triglycerides: 125 mg/dL (ref 0.0–149.0)
VLDL: 25 mg/dL (ref 0.0–40.0)

## 2017-03-17 LAB — TSH: TSH: 1.21 u[IU]/mL (ref 0.35–4.50)

## 2017-03-17 LAB — HEMOGLOBIN A1C: Hgb A1c MFr Bld: 5.5 % (ref 4.6–6.5)

## 2017-03-17 MED ORDER — TELMISARTAN 40 MG PO TABS: 40.0000 mg | ORAL_TABLET | Freq: Every day | ORAL | 1 refills | Status: DC
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
## 2017-03-17 NOTE — Patient Instructions (Signed)

## 2017-03-18 ENCOUNTER — Encounter: Payer: Self-pay | Admitting: Internal Medicine

## 2017-03-18 LAB — HEPATITIS B SURFACE ANTIGEN: Hepatitis B Surface Ag: NONREACTIVE

## 2017-03-18 LAB — HEPATITIS B SURFACE ANTIBODY,QUALITATIVE: HEP B S AB: NONREACTIVE

## 2017-03-18 LAB — HEPATITIS C ANTIBODY
Hepatitis C Ab: NONREACTIVE
SIGNAL TO CUT-OFF: 0.01 (ref <1.00)

## 2017-03-18 LAB — HEPATITIS A ANTIBODY, TOTAL: HEPATITIS A AB,TOTAL: NONREACTIVE

## 2017-03-18 LAB — HEPATITIS B CORE ANTIBODY, TOTAL: HEP B C TOTAL AB: NONREACTIVE

## 2017-03-19 ENCOUNTER — Telehealth: Payer: Self-pay | Admitting: Internal Medicine

## 2017-03-19 NOTE — Telephone Encounter (Signed)
Pt went to the pharmacy to pick up her Micaris but it was very expensive because she is in the donut hole. She said that the pharmacy recommened that she try chlorothiazide to help reduce the swelling. The pt said that she can continue taking her old BP med at least until the first of the year. Please advise.

## 2017-03-19 NOTE — Telephone Encounter (Signed)
LM letting pt know °

## 2017-03-19 NOTE — Telephone Encounter (Signed)
Will you let the patient know that there is patient assistance for this medication.   [place] Patient Assistance Program at [phone]

## 2017-03-20 ENCOUNTER — Other Ambulatory Visit: Payer: Self-pay | Admitting: Internal Medicine

## 2017-03-20 NOTE — Telephone Encounter (Signed)
Patient states she would like a dieretic prescribed before then.  Would like to know if she can get chlorothiazide to be prescribed until patient assistance is processed through?  Patient can be reached at [phone].

## 2017-03-20 NOTE — Telephone Encounter (Signed)
Pt is having trouble affording the Micardis. She is rq a diuretic rx'ed.   Pt will try for patient assistance. Please advise

## 2017-03-20 NOTE — Telephone Encounter (Signed)
Spoke to pt and gave MD response. She will call back next week with what her BP has been.

## 2017-03-20 NOTE — Telephone Encounter (Signed)
Try no BP med for a while and see what happens

## 2017-05-28 DIAGNOSIS — M25561 Pain in right knee: Secondary | ICD-10-CM | POA: Diagnosis not present

## 2017-05-28 DIAGNOSIS — M1711 Unilateral primary osteoarthritis, right knee: Secondary | ICD-10-CM | POA: Diagnosis not present

## 2017-06-03 ENCOUNTER — Telehealth: Payer: Self-pay | Admitting: Internal Medicine

## 2017-06-03 NOTE — Telephone Encounter (Signed)
Would you like for patient to set up an appointment?

## 2017-06-03 NOTE — Telephone Encounter (Signed)
Copied from [place]. Topic: Quick Communication - See Telephone Encounter >> Jun 03, 2017 11:07 AM Synthia Innocent wrote: CRM for notification. See Telephone encounter for:  Would like Dr Ronnald Ramp advice on doing Keto Tone? Please advise 06/03/17.

## 2017-06-04 NOTE — Telephone Encounter (Signed)
Pt informed. Pt stated that she did NOT call about the keto diet.

## 2017-06-04 NOTE — Telephone Encounter (Signed)
I do not recommend the keto diet  It is too harsh and rigid

## 2017-07-24 DIAGNOSIS — D485 Neoplasm of uncertain behavior of skin: Secondary | ICD-10-CM | POA: Diagnosis not present

## 2017-07-24 DIAGNOSIS — D225 Melanocytic nevi of trunk: Secondary | ICD-10-CM | POA: Diagnosis not present

## 2017-07-24 DIAGNOSIS — L814 Other melanin hyperpigmentation: Secondary | ICD-10-CM | POA: Diagnosis not present

## 2017-07-24 DIAGNOSIS — L821 Other seborrheic keratosis: Secondary | ICD-10-CM | POA: Diagnosis not present

## 2017-07-24 DIAGNOSIS — D1801 Hemangioma of skin and subcutaneous tissue: Secondary | ICD-10-CM | POA: Diagnosis not present

## 2017-07-24 DIAGNOSIS — L570 Actinic keratosis: Secondary | ICD-10-CM | POA: Diagnosis not present

## 2017-07-24 DIAGNOSIS — Z85828 Personal history of other malignant neoplasm of skin: Secondary | ICD-10-CM | POA: Diagnosis not present

## 2017-08-28 DIAGNOSIS — L820 Inflamed seborrheic keratosis: Secondary | ICD-10-CM | POA: Diagnosis not present

## 2017-08-28 DIAGNOSIS — L570 Actinic keratosis: Secondary | ICD-10-CM | POA: Diagnosis not present

## 2017-08-28 DIAGNOSIS — Z85828 Personal history of other malignant neoplasm of skin: Secondary | ICD-10-CM | POA: Diagnosis not present

## 2017-08-28 DIAGNOSIS — D485 Neoplasm of uncertain behavior of skin: Secondary | ICD-10-CM | POA: Diagnosis not present

## 2017-08-28 DIAGNOSIS — D0472 Carcinoma in situ of skin of left lower limb, including hip: Secondary | ICD-10-CM | POA: Diagnosis not present

## 2017-09-11 DIAGNOSIS — M25561 Pain in right knee: Secondary | ICD-10-CM | POA: Diagnosis not present

## 2017-09-11 DIAGNOSIS — M1711 Unilateral primary osteoarthritis, right knee: Secondary | ICD-10-CM | POA: Diagnosis not present

## 2017-09-23 ENCOUNTER — Ambulatory Visit (INDEPENDENT_AMBULATORY_CARE_PROVIDER_SITE_OTHER): Payer: Medicare Other | Admitting: Internal Medicine

## 2017-09-23 ENCOUNTER — Encounter: Payer: Self-pay | Admitting: Internal Medicine

## 2017-09-23 VITALS — BP 116/74 | HR 92 | Temp 98.5°F | Ht 68.5 in | Wt 236.0 lb

## 2017-09-23 DIAGNOSIS — R300 Dysuria: Secondary | ICD-10-CM | POA: Diagnosis not present

## 2017-09-23 DIAGNOSIS — N3000 Acute cystitis without hematuria: Secondary | ICD-10-CM | POA: Insufficient documentation

## 2017-09-23 DIAGNOSIS — N3001 Acute cystitis with hematuria: Secondary | ICD-10-CM | POA: Diagnosis not present

## 2017-09-23 LAB — POCT URINALYSIS DIPSTICK
BILIRUBIN UA: NEGATIVE
GLUCOSE UA: NEGATIVE
KETONES UA: NEGATIVE
Nitrite, UA: POSITIVE
SPEC GRAV UA: 1.015 (ref 1.010–1.025)
Urobilinogen, UA: 1 E.U./dL
pH, UA: 6 (ref 5.0–8.0)

## 2017-09-23 MED ORDER — SULFAMETHOXAZOLE-TRIMETHOPRIM 800-160 MG PO TABS: 1.0000 | ORAL_TABLET | Freq: Two times a day (BID) | ORAL | 0 refills | Status: DC

## 2017-09-23 NOTE — Progress Notes (Signed)
   Subjective:    Patient ID: Dawn [name], female    DOB: [birth year], 67 y.o.   MRN: [id number]  HPI The patient is a 67 YO female coming in for dysuria. Started 7 days ago and overall symptoms are improved. She took azo and this helped in about a day but the symptoms returned. She has tried drinking more liquids which has not helped. Denies fevers or chills. Denies abdominal pain. Denies nausea or vomiting. Denies back pains. Denies blood in urine.   Review of Systems  Constitutional: Negative.   Respiratory: Negative.   Cardiovascular: Negative.   Gastrointestinal: Negative for abdominal distention, abdominal pain, constipation, diarrhea, nausea and vomiting.  Genitourinary: Positive for dysuria, frequency and urgency.  Musculoskeletal: Negative.   Skin: Negative.       Objective:   Physical Exam  Constitutional: She is oriented to person, place, and time. She appears well-developed and well-nourished.  HENT:  Head: Normocephalic and atraumatic.  Eyes: EOM are normal.  Neck: Normal range of motion.  Cardiovascular: Normal rate and regular rhythm.  Pulmonary/Chest: Effort normal and breath sounds normal. No respiratory distress. She has no wheezes. She has no rales.  Abdominal: Soft. Bowel sounds are normal. She exhibits no distension. There is no tenderness. There is no rebound.  Musculoskeletal: She exhibits no edema.  Neurological: She is alert and oriented to person, place, and time. Coordination normal.  Skin: Skin is warm and dry.   Vitals:   09/23/17 1300  BP: 116/74  Pulse: 92  Temp: 98.5 F (36.9 C)  TempSrc: Oral  SpO2: 95%  Weight: 236 lb (107 kg)  Height: 5' 8.5" (1.74 m)      Assessment & Plan:

## 2017-09-23 NOTE — Patient Instructions (Signed)
We have sent in the bactrim to take 1 pill twice a day for 5 days.   Call us back if symptoms are not improved.

## 2017-09-23 NOTE — Assessment & Plan Note (Signed)
Rx for bactrim. POC u/a done in the office consistent with infection. No signs of symptoms of pyelonephritis.

## 2017-10-14 ENCOUNTER — Telehealth: Payer: Self-pay | Admitting: Emergency Medicine

## 2017-10-14 NOTE — Telephone Encounter (Signed)
Called patient to schedule AWV. Patient declined at this time. 

## 2017-10-29 DIAGNOSIS — Z85828 Personal history of other malignant neoplasm of skin: Secondary | ICD-10-CM | POA: Diagnosis not present

## 2017-11-26 DIAGNOSIS — Z471 Aftercare following joint replacement surgery: Secondary | ICD-10-CM | POA: Diagnosis not present

## 2017-11-26 DIAGNOSIS — M1712 Unilateral primary osteoarthritis, left knee: Secondary | ICD-10-CM | POA: Diagnosis not present

## 2017-11-26 DIAGNOSIS — M25561 Pain in right knee: Secondary | ICD-10-CM | POA: Diagnosis not present

## 2017-11-26 DIAGNOSIS — Z96652 Presence of left artificial knee joint: Secondary | ICD-10-CM | POA: Diagnosis not present

## 2017-11-26 DIAGNOSIS — M1711 Unilateral primary osteoarthritis, right knee: Secondary | ICD-10-CM | POA: Diagnosis not present

## 2017-12-12 DIAGNOSIS — M25561 Pain in right knee: Secondary | ICD-10-CM | POA: Diagnosis not present

## 2017-12-12 DIAGNOSIS — M1711 Unilateral primary osteoarthritis, right knee: Secondary | ICD-10-CM | POA: Diagnosis not present

## 2018-01-02 ENCOUNTER — Ambulatory Visit (INDEPENDENT_AMBULATORY_CARE_PROVIDER_SITE_OTHER): Payer: Medicare Other | Admitting: Internal Medicine

## 2018-01-02 ENCOUNTER — Encounter: Payer: Self-pay | Admitting: Internal Medicine

## 2018-01-02 VITALS — BP 130/76 | HR 81 | Temp 98.0°F | Ht 68.5 in | Wt 226.4 lb

## 2018-01-02 DIAGNOSIS — J069 Acute upper respiratory infection, unspecified: Secondary | ICD-10-CM | POA: Diagnosis not present

## 2018-01-02 DIAGNOSIS — R739 Hyperglycemia, unspecified: Secondary | ICD-10-CM

## 2018-01-02 DIAGNOSIS — I10 Essential (primary) hypertension: Secondary | ICD-10-CM | POA: Diagnosis not present

## 2018-01-02 MED ORDER — HYDROCODONE-HOMATROPINE 5-1.5 MG/5ML PO SYRP: 5.0000 mL | ORAL_SOLUTION | Freq: Four times a day (QID) | ORAL | 0 refills | Status: AC | PRN

## 2018-01-02 MED ORDER — LEVOFLOXACIN 500 MG PO TABS: 500.0000 mg | ORAL_TABLET | Freq: Every day | ORAL | 0 refills | Status: AC

## 2018-01-02 NOTE — Assessment & Plan Note (Signed)
stable overall by history and exam, recent data reviewed with pt, and pt to continue medical treatment as before,  to f/u any worsening symptoms or concerns Lab Results  Component Value Date   HGBA1C 5.5 03/17/2017
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
## 2018-01-02 NOTE — Assessment & Plan Note (Signed)
stable overall by history and exam, recent data reviewed with pt, and pt to continue medical treatment as before,  to f/u any worsening symptoms or concerns BP Readings from Last 3 Encounters:  01/02/18 130/76  09/23/17 116/74  03/17/17 124/68

## 2018-01-02 NOTE — Assessment & Plan Note (Signed)
Mild to mod, for antibx course, cough med prn, to f/u any worsening symptoms or concerns 

## 2018-01-02 NOTE — Patient Instructions (Signed)
Please take all new medication as prescribed - the antibiotic, and cough medicine if needed  You can also take Mucinex (or it's generic off brand) for congestion, and tylenol as needed for pain.  Please continue all other medications as before, and refills have been done if requested.  Please have the pharmacy call with any other refills you may need.  Please keep your appointments with your specialists as you may have planned   

## 2018-01-09 ENCOUNTER — Telehealth: Payer: Self-pay | Admitting: Internal Medicine

## 2018-01-09 NOTE — Telephone Encounter (Signed)
She will need to be reevaluated. I recommend an urgent care there so they can check her ears again and confirm the need for additional abx.

## 2018-01-09 NOTE — Telephone Encounter (Signed)
Copied from [place] [id number]. Topic: Quick Communication - See Telephone Encounter >> Jan 09, 2018  9:18 AM Ahmed Prima L wrote: CRM for notification. See Telephone encounter for: 01/09/18.  Patient states that she saw Dr Jenny Reichmann on 8/23 for congestion/ear pain. She took her antibiotic and tried mucinex as he advised. She said that her left ear is still clogged, feels like she has a ocean in her head. She wants to know can something be called in for her ear? She is on vacation in [place] and wants it to be called into - [facility] [place], [place] [phone].

## 2018-01-09 NOTE — Telephone Encounter (Signed)
Patient as been informed. She stated she was close to an urgent care and would go by there.

## 2018-01-14 ENCOUNTER — Encounter: Payer: Self-pay | Admitting: Internal Medicine

## 2018-01-14 ENCOUNTER — Ambulatory Visit (INDEPENDENT_AMBULATORY_CARE_PROVIDER_SITE_OTHER): Payer: Medicare Other | Admitting: Internal Medicine

## 2018-01-14 VITALS — BP 128/78 | HR 80 | Temp 98.7°F | Ht 68.5 in | Wt 231.0 lb

## 2018-01-14 DIAGNOSIS — I10 Essential (primary) hypertension: Secondary | ICD-10-CM | POA: Diagnosis not present

## 2018-01-14 DIAGNOSIS — H6982 Other specified disorders of Eustachian tube, left ear: Secondary | ICD-10-CM | POA: Diagnosis not present

## 2018-01-14 DIAGNOSIS — H6992 Unspecified Eustachian tube disorder, left ear: Secondary | ICD-10-CM | POA: Insufficient documentation

## 2018-01-14 DIAGNOSIS — H6692 Otitis media, unspecified, left ear: Secondary | ICD-10-CM | POA: Diagnosis not present

## 2018-01-14 MED ORDER — AMOXICILLIN-POT CLAVULANATE 875-125 MG PO TABS: 1.0000 | ORAL_TABLET | Freq: Two times a day (BID) | ORAL | 0 refills | Status: DC

## 2018-01-14 NOTE — Assessment & Plan Note (Signed)
stable overall by history and exam, recent data reviewed with pt, and pt to continue medical treatment as before,  to f/u any worsening symptoms or concerns  

## 2018-01-14 NOTE — Assessment & Plan Note (Signed)
For mucinex otc prn, and add nasacort asd,  to f/u any worsening symptoms or concerns
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
## 2018-01-14 NOTE — Assessment & Plan Note (Signed)
Mild to mod, for antibx course, refer ENT,  to f/u any worsening symptoms or concerns °

## 2018-01-14 NOTE — Patient Instructions (Signed)
Please take all new medication as prescribed - the augmentin, as well as the OTC Nasacort  You can also take Mucinex (or it's generic off brand) for congestion, and tylenol as needed for pain.  You will be contacted regarding the referral for: ENT  Please continue all other medications as before, and refills have been done if requested.  Please have the pharmacy call with any other refills you may need.  Please keep your appointments with your specialists as you may have planned

## 2018-01-26 DIAGNOSIS — H6983 Other specified disorders of Eustachian tube, bilateral: Secondary | ICD-10-CM | POA: Diagnosis not present

## 2018-01-26 DIAGNOSIS — J302 Other seasonal allergic rhinitis: Secondary | ICD-10-CM | POA: Diagnosis not present

## 2018-01-26 DIAGNOSIS — H6690 Otitis media, unspecified, unspecified ear: Secondary | ICD-10-CM | POA: Diagnosis not present

## 2018-02-18 ENCOUNTER — Ambulatory Visit: Payer: Medicare Other

## 2018-02-18 DIAGNOSIS — Z23 Encounter for immunization: Secondary | ICD-10-CM

## 2018-02-19 ENCOUNTER — Ambulatory Visit (INDEPENDENT_AMBULATORY_CARE_PROVIDER_SITE_OTHER): Payer: Medicare Other

## 2018-02-19 DIAGNOSIS — Z23 Encounter for immunization: Secondary | ICD-10-CM

## 2018-03-05 DIAGNOSIS — M25561 Pain in right knee: Secondary | ICD-10-CM | POA: Diagnosis not present

## 2018-03-05 DIAGNOSIS — M1712 Unilateral primary osteoarthritis, left knee: Secondary | ICD-10-CM | POA: Diagnosis not present

## 2018-03-05 DIAGNOSIS — M1711 Unilateral primary osteoarthritis, right knee: Secondary | ICD-10-CM | POA: Diagnosis not present

## 2018-06-11 ENCOUNTER — Encounter: Payer: Self-pay | Admitting: Internal Medicine

## 2018-06-11 ENCOUNTER — Ambulatory Visit (INDEPENDENT_AMBULATORY_CARE_PROVIDER_SITE_OTHER): Payer: Medicare Other | Admitting: Internal Medicine

## 2018-06-11 ENCOUNTER — Other Ambulatory Visit (INDEPENDENT_AMBULATORY_CARE_PROVIDER_SITE_OTHER): Payer: Medicare Other

## 2018-06-11 VITALS — BP 162/100 | HR 85 | Temp 98.5°F | Resp 16 | Ht 68.5 in | Wt 221.0 lb

## 2018-06-11 DIAGNOSIS — R945 Abnormal results of liver function studies: Secondary | ICD-10-CM | POA: Diagnosis not present

## 2018-06-11 DIAGNOSIS — Z23 Encounter for immunization: Secondary | ICD-10-CM | POA: Diagnosis not present

## 2018-06-11 DIAGNOSIS — Z01419 Encounter for gynecological examination (general) (routine) without abnormal findings: Secondary | ICD-10-CM | POA: Diagnosis not present

## 2018-06-11 DIAGNOSIS — R739 Hyperglycemia, unspecified: Secondary | ICD-10-CM

## 2018-06-11 DIAGNOSIS — R7989 Other specified abnormal findings of blood chemistry: Secondary | ICD-10-CM

## 2018-06-11 DIAGNOSIS — I10 Essential (primary) hypertension: Secondary | ICD-10-CM

## 2018-06-11 DIAGNOSIS — R0683 Snoring: Secondary | ICD-10-CM | POA: Diagnosis not present

## 2018-06-11 DIAGNOSIS — E785 Hyperlipidemia, unspecified: Secondary | ICD-10-CM

## 2018-06-11 DIAGNOSIS — Z01818 Encounter for other preprocedural examination: Secondary | ICD-10-CM

## 2018-06-11 DIAGNOSIS — E6609 Other obesity due to excess calories: Secondary | ICD-10-CM

## 2018-06-11 DIAGNOSIS — Z6833 Body mass index (BMI) 33.0-33.9, adult: Secondary | ICD-10-CM | POA: Diagnosis not present

## 2018-06-11 DIAGNOSIS — Z1231 Encounter for screening mammogram for malignant neoplasm of breast: Secondary | ICD-10-CM | POA: Diagnosis not present

## 2018-06-11 DIAGNOSIS — Z124 Encounter for screening for malignant neoplasm of cervix: Secondary | ICD-10-CM | POA: Diagnosis not present

## 2018-06-11 LAB — URINALYSIS, ROUTINE W REFLEX MICROSCOPIC
Hgb urine dipstick: NEGATIVE
Ketones, ur: NEGATIVE
Leukocytes, UA: NEGATIVE
Nitrite: NEGATIVE
Specific Gravity, Urine: 1.025 (ref 1.000–1.030)
TOTAL PROTEIN, URINE-UPE24: NEGATIVE
Urine Glucose: NEGATIVE
Urobilinogen, UA: 0.2 (ref 0.0–1.0)
pH: 6 (ref 5.0–8.0)

## 2018-06-11 LAB — CBC WITH DIFFERENTIAL/PLATELET
Basophils Absolute: 0.1 10*3/uL (ref 0.0–0.1)
Basophils Relative: 2.3 % (ref 0.0–3.0)
Eosinophils Absolute: 0.2 10*3/uL (ref 0.0–0.7)
Eosinophils Relative: 3.4 % (ref 0.0–5.0)
HCT: 45.9 % (ref 36.0–46.0)
Hemoglobin: 15.5 g/dL — ABNORMAL HIGH (ref 12.0–15.0)
Lymphocytes Relative: 35.5 % (ref 12.0–46.0)
Lymphs Abs: 1.6 10*3/uL (ref 0.7–4.0)
MCHC: 33.8 g/dL (ref 30.0–36.0)
MCV: 95.2 fl (ref 78.0–100.0)
Monocytes Absolute: 0.7 10*3/uL (ref 0.1–1.0)
Monocytes Relative: 14.6 % — ABNORMAL HIGH (ref 3.0–12.0)
Neutro Abs: 2 10*3/uL (ref 1.4–7.7)
Neutrophils Relative %: 44.2 % (ref 43.0–77.0)
PLATELETS: 267 10*3/uL (ref 150.0–400.0)
RBC: 4.82 Mil/uL (ref 3.87–5.11)
RDW: 13.2 % (ref 11.5–15.5)
WBC: 4.5 10*3/uL (ref 4.0–10.5)

## 2018-06-11 LAB — COMPREHENSIVE METABOLIC PANEL
ALT: 47 U/L — AB (ref 0–35)
AST: 37 U/L (ref 0–37)
Albumin: 4.3 g/dL (ref 3.5–5.2)
Alkaline Phosphatase: 92 U/L (ref 39–117)
BILIRUBIN TOTAL: 0.6 mg/dL (ref 0.2–1.2)
BUN: 17 mg/dL (ref 6–23)
CO2: 29 mEq/L (ref 19–32)
Calcium: 9.7 mg/dL (ref 8.4–10.5)
Chloride: 104 mEq/L (ref 96–112)
Creatinine, Ser: 0.75 mg/dL (ref 0.40–1.20)
GFR: 77 mL/min (ref >60.00)
Glucose, Bld: 96 mg/dL (ref 70–99)
Potassium: 4.8 mEq/L (ref 3.5–5.1)
Sodium: 142 mEq/L (ref 135–145)
Total Protein: 6.9 g/dL (ref 6.0–8.3)

## 2018-06-11 LAB — LIPID PANEL
CHOL/HDL RATIO: 3
Cholesterol: 240 mg/dL — ABNORMAL HIGH (ref 0–200)
HDL: 75.3 mg/dL (ref >39.00)
LDL Cholesterol: 147 mg/dL — ABNORMAL HIGH (ref 0–99)
NonHDL: 164.49
TRIGLYCERIDES: 88 mg/dL (ref 0.0–149.0)
VLDL: 17.6 mg/dL (ref 0.0–40.0)

## 2018-06-11 LAB — TSH: TSH: 1.78 u[IU]/mL (ref 0.35–4.50)

## 2018-06-11 LAB — PROTIME-INR
INR: 0.9 ratio (ref 0.8–1.0)
Prothrombin Time: 11.1 s (ref 9.6–13.1)

## 2018-06-11 LAB — HEMOGLOBIN A1C: Hgb A1c MFr Bld: 5 % (ref 4.6–6.5)

## 2018-06-11 MED ORDER — OLMESARTAN MEDOXOMIL-HCTZ 20-12.5 MG PO TABS: 1.0000 | ORAL_TABLET | Freq: Every day | ORAL | 0 refills | Status: DC

## 2018-06-11 MED ORDER — ROSUVASTATIN CALCIUM 5 MG PO TABS: 5.0000 mg | ORAL_TABLET | Freq: Every day | ORAL | 1 refills | Status: DC

## 2018-06-11 NOTE — Progress Notes (Signed)
Subjective:  Patient ID: Dawn [name], female    DOB: 10/16/1950  Age: 68 y.o. MRN: [id number]  CC: Hypertension   HPI Dawn [name] presents for f/up - She has been working on her lifestyle modifications recently and has been able to lose 10 pounds.  She does not monitor her blood pressure.  She is active and denies any recent episodes of CP, DOE, palpitations, headache, blurred vision, edema, or fatigue.  She tells me her husband complains about her snoring.  Outpatient Medications Prior to Visit  Medication Sig Dispense Refill  . Naproxen Sodium (ALEVE PO) Aleve    . amoxicillin-clavulanate (AUGMENTIN) 875-125 MG tablet Take 1 tablet by mouth 2 (two) times daily. 20 tablet 0   No facility-administered medications prior to visit.     ROS Review of Systems  Constitutional: Negative for diaphoresis, fatigue and unexpected weight change.  HENT: Negative.  Negative for trouble swallowing.   Eyes: Negative for visual disturbance.  Respiratory: Negative for cough, chest tightness, shortness of breath and wheezing.   Cardiovascular: Negative for chest pain, palpitations and leg swelling.  Gastrointestinal: Negative for abdominal pain, constipation, diarrhea, nausea and vomiting.  Genitourinary: Negative.  Negative for difficulty urinating.  Musculoskeletal: Negative.  Negative for arthralgias, myalgias and neck pain.  Skin: Negative.  Negative for color change and pallor.  Neurological: Negative.  Negative for dizziness, weakness and light-headedness.  Hematological: Negative for adenopathy. Does not bruise/bleed easily.  Psychiatric/Behavioral: Negative.     Objective:  BP (!) 162/100 (BP Location: Left Arm, Patient Position: Sitting, Cuff Size: Large)   Pulse 85   Temp 98.5 F (36.9 C) (Oral)   Resp 16   Ht 5' 8.5" (1.74 m)   Wt 221 lb (100.2 kg)   SpO2 98%   BMI 33.11 kg/m   BP Readings from Last 3 Encounters:  06/11/18 (!) 162/100  01/14/18 128/78  01/02/18  130/76    Wt Readings from Last 3 Encounters:  06/11/18 221 lb (100.2 kg)  01/14/18 231 lb (104.8 kg)  01/02/18 226 lb 6.4 oz (102.7 kg)    Physical Exam Nursing note reviewed.  Constitutional:      Appearance: She is obese. She is not ill-appearing or diaphoretic.  HENT:     Nose: Nose normal. No congestion.     Mouth/Throat:     Mouth: Mucous membranes are moist.     Pharynx: Oropharynx is clear. No oropharyngeal exudate or posterior oropharyngeal erythema.  Eyes:     General: No scleral icterus.    Conjunctiva/sclera: Conjunctivae normal.  Neck:     Musculoskeletal: Normal range of motion and neck supple.  Cardiovascular:     Rate and Rhythm: Normal rate and regular rhythm.     Pulses: Normal pulses.     Heart sounds: No murmur. No gallop.      Comments: EKG ---  Sinus  Rhythm  -Old anteroseptal infarct.   ABNORMAL - no change from the prior EKG Pulmonary:     Effort: Pulmonary effort is normal.     Breath sounds: No stridor. No wheezing, rhonchi or rales.  Abdominal:     General: Abdomen is flat. Bowel sounds are normal.     Palpations: There is no hepatomegaly, splenomegaly or mass.     Tenderness: There is no abdominal tenderness. There is no guarding.  Musculoskeletal: Normal range of motion.        General: No swelling.     Right lower leg: No edema.  Left lower leg: No edema.  Skin:    General: Skin is warm and dry.     Coloration: Skin is not pale.     Findings: No erythema or rash.  Neurological:     General: No focal deficit present.     Mental Status: She is oriented to person, place, and time. Mental status is at baseline.  Psychiatric:        Mood and Affect: Mood normal.        Behavior: Behavior normal.        Thought Content: Thought content normal.        Judgment: Judgment normal.     Lab Results  Component Value Date   WBC 4.5 06/11/2018   HGB 15.5 (H) 06/11/2018   HCT 45.9 06/11/2018   PLT 267.0 06/11/2018   GLUCOSE 96  06/11/2018   CHOL 240 (H) 06/11/2018   TRIG 88.0 06/11/2018   HDL 75.30 06/11/2018   LDLCALC 147 (H) 06/11/2018   ALT 47 (H) 06/11/2018   AST 37 06/11/2018   NA 142 06/11/2018   K 4.8 06/11/2018   CL 104 06/11/2018   CREATININE 0.75 06/11/2018   BUN 17 06/11/2018   CO2 29 06/11/2018   TSH 1.78 06/11/2018   INR 0.9 06/11/2018   HGBA1C 5.0 06/11/2018    No results found.  Assessment & Plan:   Dawn [name] was seen today for hypertension.  Diagnoses and all orders for this visit:  Essential hypertension- Her blood pressure is not adequately well controlled.  Her EKG is negative for LVH or ischemia.  Her labs are negative for secondary causes or endorgan damage.  I have asked her to start taking an ARB and thiazide diuretic to control her blood pressure. -     Comprehensive metabolic panel; Future -     CBC with Differential/Platelet; Future -     TSH; Future -     Urinalysis, Routine w reflex microscopic; Future -     Discontinue: olmesartan-hydrochlorothiazide (BENICAR HCT) 20-12.5 MG tablet; Take 1 tablet by mouth daily. -     olmesartan-hydrochlorothiazide (BENICAR HCT) 20-12.5 MG tablet; Take 1 tablet by mouth daily.  Class 1 obesity due to excess calories with serious comorbidity and body mass index (BMI) of 33.0 to 33.9 in adult- She was praised for the 10 pound weight loss and will continue to work on her lifestyle modifications.  Hyperlipidemia LDL goal <130- Her ASCVD risk is up to 14%.  I have asked her to start taking a statin for CV risk reduction. -     Lipid panel; Future -     TSH; Future  Hyperglycemia- Her blood sugars are normal now. -     Hemoglobin A1c; Future  Preoperative clearance- In her current state her blood pressure is too high for her to undergo orthopedic surgery. -     EKG 12-Lead  Elevated LFTs- This is likely caused by her alcohol intake.  I have asked her to limit her alcohol intake to less than 2 drinks per day. -     Protime-INR;  Future  Loud snoring -     Ambulatory referral to Sleep Studies   I have discontinued Dawn [name]'s amoxicillin-clavulanate. I am also having her start on rosuvastatin. Additionally, I am having her maintain her Naproxen Sodium (ALEVE PO) and olmesartan-hydrochlorothiazide.  Meds ordered this encounter  Medications  . DISCONTD: olmesartan-hydrochlorothiazide (BENICAR HCT) 20-12.5 MG tablet    Sig: Take 1 tablet by mouth daily.  Dispense:  90 tablet    Refill:  0  . olmesartan-hydrochlorothiazide (BENICAR HCT) 20-12.5 MG tablet    Sig: Take 1 tablet by mouth daily.    Dispense:  90 tablet    Refill:  0  . rosuvastatin (CRESTOR) 5 MG tablet    Sig: Take 1 tablet (5 mg total) by mouth daily.    Dispense:  90 tablet    Refill:  1     Follow-up: Return in about 4 weeks (around 07/09/2018).  Scarlette Calico, MD

## 2018-06-11 NOTE — Patient Instructions (Signed)
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
## 2018-06-15 ENCOUNTER — Other Ambulatory Visit: Payer: Self-pay | Admitting: Obstetrics

## 2018-06-15 DIAGNOSIS — N6320 Unspecified lump in the left breast, unspecified quadrant: Secondary | ICD-10-CM

## 2018-06-15 LAB — HM MAMMOGRAPHY

## 2018-06-18 ENCOUNTER — Ambulatory Visit
Admission: RE | Admit: 2018-06-18 | Discharge: 2018-06-18 | Disposition: A | Discharge status: Home / Self Care | Payer: Medicare Other | Source: Ambulatory Visit | Attending: Obstetrics | Admitting: Obstetrics

## 2018-06-18 DIAGNOSIS — N6320 Unspecified lump in the left breast, unspecified quadrant: Secondary | ICD-10-CM

## 2018-06-18 DIAGNOSIS — N6002 Solitary cyst of left breast: Secondary | ICD-10-CM | POA: Diagnosis not present

## 2018-06-18 DIAGNOSIS — R928 Other abnormal and inconclusive findings on diagnostic imaging of breast: Secondary | ICD-10-CM | POA: Diagnosis not present

## 2018-06-30 ENCOUNTER — Ambulatory Visit: Payer: Self-pay | Admitting: *Deleted

## 2018-06-30 ENCOUNTER — Telehealth: Payer: Self-pay | Admitting: Internal Medicine

## 2018-06-30 NOTE — Telephone Encounter (Signed)
Copied from [place] [id number]. Topic: General - Inquiry >> Jun 30, 2018  4:46 PM Jeri Cos wrote: Reason for CRM: Pt would like a call back to inquire about her blood pressure and the surgery scheduled for 07/07/18.

## 2018-06-30 NOTE — Telephone Encounter (Signed)
Pt states she has surgery scheduled with Dr. Alvan Dame next Tuesday. States she found out today her household has bed bugs. Dr. Aurea Graff practice has been working on switching dates for pre-screening as she can not be seen by them until house is treated. Pt states she will have to let exterminators know in AM when to come to house, either Thursday or Friday. Questioning if she can keep appt for BP check this Thursday at 0830, if house has not yet  been treated. If not, asking if she could be seen Friday or Monday and is that enough time for pre-screening for surgery. Pt very anxious regarding this issue, tearful. Reassurance given. Requesting CB from practice as early as possible tomorrow AM so she can move forward with appt's.   CB: [phone] States please do not response in MyChart.  Reason for Disposition . [1] Caller requesting NON-URGENT health information AND [2] PCP's office is the best resource  Answer Assessment - Initial Assessment Questions 1. REASON FOR CALL or QUESTION: "What is your reason for calling today?" or "How can I best help you?" or "What question do you have that I can help answer?"     Regarding appt  Protocols used: INFORMATION ONLY CALL-A-AH

## 2018-06-30 NOTE — Telephone Encounter (Signed)
Attempted to return call to pt x2 but no answer. Unable to leave message and message states that call can not be completed at this time.

## 2018-06-30 NOTE — Telephone Encounter (Signed)
Message from Sharene Skeans sent at 06/30/2018 5:16 PM EST   Summary: advise   Sam just asked if one of the triage nurses could speak with her. She didn't know what she wanted  ----- Message from Sharene Skeans sent at 06/30/2018 5:00 PM EST ----- Pt called to change her fu appt on Friday when I advised her that Dr, Jenny Reichmann was not in she asked to speak with a nurse, I think Pt is thinking her BP check has to do with her surgery/ please advise

## 2018-06-30 NOTE — Telephone Encounter (Signed)
See triage encounter for 06/30/18

## 2018-07-01 NOTE — Telephone Encounter (Signed)
Can you add pt to Monday at 10:45am and cancel her appt for tomorrow please. She is coming in only for a BP check needed for surgical clearance. We have done all the labs and EKG at her 06/11/2018 appt.

## 2018-07-02 ENCOUNTER — Encounter (HOSPITAL_COMMUNITY): Admission: RE | Admit: 2018-07-02 | Payer: Medicare Other | Source: Ambulatory Visit

## 2018-07-02 ENCOUNTER — Ambulatory Visit: Payer: Medicare Other | Admitting: Internal Medicine

## 2018-07-03 NOTE — Patient Instructions (Signed)
Dawn [name]  07/03/2018   Your procedure is scheduled on: 07-23-18   Report to [facility] Main  Entrance              Report to admitting at          0800 AM    Call this number if you have problems the morning of surgery [phone]    Remember: Do not eat food or drink liquids :After Midnight.   BRUSH YOUR TEETH MORNING OF SURGERY AND RINSE YOUR MOUTH OUT, NO CHEWING GUM CANDY OR MINTS.     Take these medicines the morning of surgery with A SIP OF WATER: crestor                                You may not have any metal on your body including hair pins and              piercings  Do not wear jewelry, make-up, lotions, powders or perfumes, deodorant             Do not wear nail polish.  Do not shave  48 hours prior to surgery.            Do not bring valuables to the hospital. [place].  Contacts, dentures or bridgework may not be worn into surgery.  Leave suitcase in the car. After surgery it may be brought to your room.                Please read over the following fact sheets you were given: _____________________________________________________________________          [facility] - Preparing for Surgery Before surgery, you can play an important role.  Because skin is not sterile, your skin needs to be as free of germs as possible.  You can reduce the number of germs on your skin by washing with CHG (chlorahexidine gluconate) soap before surgery.  CHG is an antiseptic cleaner which kills germs and bonds with the skin to continue killing germs even after washing. Please DO NOT use if you have an allergy to CHG or antibacterial soaps.  If your skin becomes reddened/irritated stop using the CHG and inform your nurse when you arrive at Short Stay. Do not shave (including legs and underarms) for at least 48 hours prior to the first CHG shower.  You may shave your face/neck. Please follow these  instructions carefully:  1.  Shower with CHG Soap the night before surgery and the  morning of Surgery.  2.  If you choose to wash your hair, wash your hair first as usual with your  normal  shampoo.  3.  After you shampoo, rinse your hair and body thoroughly to remove the  shampoo.                           4.  Use CHG as you would any other liquid soap.  You can apply chg directly  to the skin and wash                       Gently with a scrungie or clean washcloth.  5.  Apply the  CHG Soap to your body ONLY FROM THE NECK DOWN.   Do not use on face/ open                           Wound or open sores. Avoid contact with eyes, ears mouth and genitals (private parts).                       Wash face,  Genitals (private parts) with your normal soap.             6.  Wash thoroughly, paying special attention to the area where your surgery  will be performed.  7.  Thoroughly rinse your body with warm water from the neck down.  8.  DO NOT shower/wash with your normal soap after using and rinsing off  the CHG Soap.                9.  Pat yourself dry with a clean towel.            10.  Wear clean pajamas.            11.  Place clean sheets on your bed the night of your first shower and do not  sleep with pets. Day of Surgery : Do not apply any lotions/deodorants the morning of surgery.  Please wear clean clothes to the hospital/surgery center.  FAILURE TO FOLLOW THESE INSTRUCTIONS MAY RESULT IN THE CANCELLATION OF YOUR SURGERY PATIENT SIGNATURE_________________________________  NURSE SIGNATURE__________________________________  ________________________________________________________________________  WHAT IS A BLOOD TRANSFUSION? Blood Transfusion Information  A transfusion is the replacement of blood or some of its parts. Blood is made up of multiple cells which provide different functions.  Red blood cells carry oxygen and are used for blood loss replacement.  White blood cells fight against  infection.  Platelets control bleeding.  Plasma helps clot blood.  Other blood products are available for specialized needs, such as hemophilia or other clotting disorders. BEFORE THE TRANSFUSION  Who gives blood for transfusions?   Healthy volunteers who are fully evaluated to make sure their blood is safe. This is blood bank blood. Transfusion therapy is the safest it has ever been in the practice of medicine. Before blood is taken from a donor, a complete history is taken to make sure that person has no history of diseases nor engages in risky social behavior (examples are intravenous drug use or sexual activity with multiple partners). The donor's travel history is screened to minimize risk of transmitting infections, such as malaria. The donated blood is tested for signs of infectious diseases, such as HIV and hepatitis. The blood is then tested to be sure it is compatible with you in order to minimize the chance of a transfusion reaction. If you or a relative donates blood, this is often done in anticipation of surgery and is not appropriate for emergency situations. It takes many days to process the donated blood. RISKS AND COMPLICATIONS Although transfusion therapy is very safe and saves many lives, the main dangers of transfusion include:   Getting an infectious disease.  Developing a transfusion reaction. This is an allergic reaction to something in the blood you were given. Every precaution is taken to prevent this. The decision to have a blood transfusion has been considered carefully by your caregiver before blood is given. Blood is not given unless the benefits outweigh the risks. AFTER THE TRANSFUSION  Right after receiving a blood transfusion,  you will usually feel much better and more energetic. This is especially true if your red blood cells have gotten low (anemic). The transfusion raises the level of the red blood cells which carry oxygen, and this usually causes an energy  increase.  The nurse administering the transfusion will monitor you carefully for complications. HOME CARE INSTRUCTIONS  No special instructions are needed after a transfusion. You may find your energy is better. Speak with your caregiver about any limitations on activity for underlying diseases you may have. SEEK MEDICAL CARE IF:   Your condition is not improving after your transfusion.  You develop redness or irritation at the intravenous (IV) site. SEEK IMMEDIATE MEDICAL CARE IF:  Any of the following symptoms occur over the next 12 hours:  Shaking chills.  You have a temperature by mouth above 102 F (38.9 C), not controlled by medicine.  Chest, back, or muscle pain.  People around you feel you are not acting correctly or are confused.  Shortness of breath or difficulty breathing.  Dizziness and fainting.  You get a rash or develop hives.  You have a decrease in urine output.  Your urine turns a dark color or changes to pink, red, or brown. Any of the following symptoms occur over the next 10 days:  You have a temperature by mouth above 102 F (38.9 C), not controlled by medicine.  Shortness of breath.  Weakness after normal activity.  The white part of the eye turns yellow (jaundice).  You have a decrease in the amount of urine or are urinating less often.  Your urine turns a dark color or changes to pink, red, or brown. Document Released: 04/26/2000 Document Revised: 07/22/2011 Document Reviewed: 12/14/2007 ExitCare Patient Information 2014 [place].  _______________________________________________________________________  Incentive Spirometer  An incentive spirometer is a tool that can help keep your lungs clear and active. This tool measures how well you are filling your lungs with each breath. Taking long deep breaths may help reverse or decrease the chance of developing breathing (pulmonary) problems (especially infection) following:  A long  period of time when you are unable to move or be active. BEFORE THE PROCEDURE   If the spirometer includes an indicator to show your best effort, your nurse or respiratory therapist will set it to a desired goal.  If possible, sit up straight or lean slightly forward. Try not to slouch.  Hold the incentive spirometer in an upright position. INSTRUCTIONS FOR USE  1. Sit on the edge of your bed if possible, or sit up as far as you can in bed or on a chair. 2. Hold the incentive spirometer in an upright position. 3. Breathe out normally. 4. Place the mouthpiece in your mouth and seal your lips tightly around it. 5. Breathe in slowly and as deeply as possible, raising the piston or the ball toward the top of the column. 6. Hold your breath for 3-5 seconds or for as long as possible. Allow the piston or ball to fall to the bottom of the column. 7. Remove the mouthpiece from your mouth and breathe out normally. 8. Rest for a few seconds and repeat Steps 1 through 7 at least 10 times every 1-2 hours when you are awake. Take your time and take a few normal breaths between deep breaths. 9. The spirometer may include an indicator to show your best effort. Use the indicator as a goal to work toward during each repetition. 10. After each set of 10 deep breaths,  practice coughing to be sure your lungs are clear. If you have an incision (the cut made at the time of surgery), support your incision when coughing by placing a pillow or rolled up towels firmly against it. Once you are able to get out of bed, walk around indoors and cough well. You may stop using the incentive spirometer when instructed by your caregiver.  RISKS AND COMPLICATIONS  Take your time so you do not get dizzy or light-headed.  If you are in pain, you may need to take or ask for pain medication before doing incentive spirometry. It is harder to take a deep breath if you are having pain. AFTER USE  Rest and breathe slowly and  easily.  It can be helpful to keep track of a log of your progress. Your caregiver can provide you with a simple table to help with this. If you are using the spirometer at home, follow these instructions: [place] IF:   You are having difficultly using the spirometer.  You have trouble using the spirometer as often as instructed.  Your pain medication is not giving enough relief while using the spirometer.  You develop fever of 100.5 F (38.1 C) or higher. SEEK IMMEDIATE MEDICAL CARE IF:   You cough up bloody sputum that had not been present before.  You develop fever of 102 F (38.9 C) or greater.  You develop worsening pain at or near the incision site. MAKE SURE YOU:   Understand these instructions.  Will watch your condition.  Will get help right away if you are not doing well or get worse. Document Released: 09/09/2006 Document Revised: 07/22/2011 Document Reviewed: 11/10/2006 [facility] Patient Information 2014 [place], [place].   ________________________________________________________________________

## 2018-07-06 ENCOUNTER — Encounter (HOSPITAL_COMMUNITY)
Admission: RE | Admit: 2018-07-06 | Discharge: 2018-07-06 | Disposition: A | Discharge status: Home / Self Care | Payer: Medicare Other | Source: Ambulatory Visit | Attending: Internal Medicine | Admitting: Internal Medicine

## 2018-07-06 ENCOUNTER — Ambulatory Visit: Payer: Medicare Other | Admitting: Internal Medicine

## 2018-07-08 ENCOUNTER — Encounter: Payer: Self-pay | Admitting: Internal Medicine

## 2018-07-13 ENCOUNTER — Ambulatory Visit (INDEPENDENT_AMBULATORY_CARE_PROVIDER_SITE_OTHER): Payer: Medicare Other | Admitting: Internal Medicine

## 2018-07-13 ENCOUNTER — Encounter: Payer: Self-pay | Admitting: Internal Medicine

## 2018-07-13 VITALS — BP 148/96 | HR 76 | Temp 97.9°F | Resp 16 | Ht 68.5 in | Wt 219.5 lb

## 2018-07-13 DIAGNOSIS — I10 Essential (primary) hypertension: Secondary | ICD-10-CM | POA: Diagnosis not present

## 2018-07-13 NOTE — Patient Instructions (Signed)

## 2018-07-13 NOTE — Progress Notes (Signed)
Subjective:  Patient ID: Dawn [name], female    DOB: [birth year]  Age: 68 y.o. MRN: [id number]  CC: Hypertension   HPI Dawn [name] presents for a BP check - She feels well today and offers no complaints.  She is tolerating her antihypertensives well with no side effects.  She continues to work on her lifestyle modifications and has lost weight since her last visit.  She has knee replacement surgery coming up in about 10 days.  Outpatient Medications Prior to Visit  Medication Sig Dispense Refill  . naproxen sodium (ALEVE) 220 MG tablet Take 440 mg by mouth 2 (two) times daily as needed (pain).    Marland Kitchen olmesartan-hydrochlorothiazide (BENICAR HCT) 20-12.5 MG tablet Take 1 tablet by mouth daily. 90 tablet 0  . rosuvastatin (CRESTOR) 5 MG tablet Take 1 tablet (5 mg total) by mouth daily. 90 tablet 1  . triamcinolone (NASACORT ALLERGY 24HR) 55 MCG/ACT AERO nasal inhaler Place 2 sprays into the nose daily as needed (allergies).     No facility-administered medications prior to visit.     ROS Review of Systems  Constitutional: Negative for diaphoresis and fatigue.  HENT: Negative.   Eyes: Negative for visual disturbance.  Respiratory: Negative for cough, chest tightness, shortness of breath and wheezing.   Cardiovascular: Negative for chest pain, palpitations and leg swelling.  Gastrointestinal: Negative for abdominal pain, constipation, diarrhea, nausea and vomiting.  Genitourinary: Negative.  Negative for difficulty urinating.  Musculoskeletal: Negative.  Negative for arthralgias and myalgias.  Skin: Negative.  Negative for color change.  Neurological: Negative for dizziness, weakness and headaches.  Hematological: Negative for adenopathy. Does not bruise/bleed easily.  Psychiatric/Behavioral: Negative.     Objective:  BP (!) 148/96 (BP Location: Left Arm, Patient Position: Sitting, Cuff Size: Large)   Pulse 76   Temp 97.9 F (36.6 C) (Oral)   Resp 16   Ht 5' 8.5"  (1.74 m)   Wt 219 lb 8 oz (99.6 kg)   SpO2 97%   BMI 32.89 kg/m   BP Readings from Last 3 Encounters:  07/13/18 (!) 148/96  06/11/18 (!) 162/100  01/14/18 128/78    Wt Readings from Last 3 Encounters:  07/13/18 219 lb 8 oz (99.6 kg)  06/11/18 221 lb (100.2 kg)  01/14/18 231 lb (104.8 kg)    Physical Exam Vitals signs reviewed.  Constitutional:      Appearance: She is not ill-appearing or diaphoretic.  HENT:     Nose: Nose normal. No congestion or rhinorrhea.     Mouth/Throat:     Mouth: Mucous membranes are moist.     Pharynx: Oropharynx is clear. No oropharyngeal exudate or posterior oropharyngeal erythema.  Eyes:     General: No scleral icterus.    Conjunctiva/sclera: Conjunctivae normal.  Neck:     Musculoskeletal: Normal range of motion and neck supple.  Cardiovascular:     Rate and Rhythm: Normal rate and regular rhythm.     Heart sounds: No murmur.  Pulmonary:     Effort: Pulmonary effort is normal.     Breath sounds: No stridor. No wheezing or rales.  Abdominal:     General: Bowel sounds are normal.     Palpations: There is no mass.     Tenderness: There is no abdominal tenderness. There is no guarding.  Musculoskeletal: Normal range of motion.        General: No swelling.     Right lower leg: No edema.  Skin:    General:  Skin is warm and dry.  Neurological:     General: No focal deficit present.     Mental Status: Mental status is at baseline.     Lab Results  Component Value Date   WBC 4.5 06/11/2018   HGB 15.5 (H) 06/11/2018   HCT 45.9 06/11/2018   PLT 267.0 06/11/2018   GLUCOSE 96 06/11/2018   CHOL 240 (H) 06/11/2018   TRIG 88.0 06/11/2018   HDL 75.30 06/11/2018   LDLCALC 147 (H) 06/11/2018   ALT 47 (H) 06/11/2018   AST 37 06/11/2018   NA 142 06/11/2018   K 4.8 06/11/2018   CL 104 06/11/2018   CREATININE 0.75 06/11/2018   BUN 17 06/11/2018   CO2 29 06/11/2018   TSH 1.78 06/11/2018   INR 0.9 06/11/2018   HGBA1C 5.0 06/11/2018     No results found.  Assessment & Plan:   Dawn [name] was seen today for hypertension.  Diagnoses and all orders for this visit:  Essential hypertension- Her blood pressure is not adequately well controlled but I do not think think her BP will prevent her from having orthopedic surgery.  I have asked her to stay compliant with the current antihypertensives and to continue to work on her lifestyle modifications.  She tells me that later this week she will have a preop appointment and at that time she will have her renal function and electrolytes rechecked.   I am having Dawn [name] maintain her olmesartan-hydrochlorothiazide, rosuvastatin, naproxen sodium, and triamcinolone.  No orders of the defined types were placed in this encounter.    Follow-up: Return in about 4 months (around 11/12/2018).  Dawn Calico, MD

## 2018-07-17 ENCOUNTER — Encounter (HOSPITAL_COMMUNITY)
Admission: RE | Admit: 2018-07-17 | Discharge: 2018-07-17 | Disposition: A | Discharge status: Home / Self Care | Payer: Medicare Other | Source: Ambulatory Visit | Attending: Orthopedic Surgery | Admitting: Orthopedic Surgery

## 2018-07-17 ENCOUNTER — Encounter (HOSPITAL_COMMUNITY): Payer: Self-pay

## 2018-07-17 ENCOUNTER — Other Ambulatory Visit: Payer: Self-pay

## 2018-07-17 DIAGNOSIS — Z01812 Encounter for preprocedural laboratory examination: Secondary | ICD-10-CM | POA: Diagnosis not present

## 2018-07-17 LAB — SURGICAL PCR SCREEN
MRSA, PCR: NEGATIVE
Staphylococcus aureus: NEGATIVE

## 2018-07-17 LAB — CBC
HCT: 48 % — ABNORMAL HIGH (ref 36.0–46.0)
Hemoglobin: 15.4 g/dL — ABNORMAL HIGH (ref 12.0–15.0)
MCH: 31.5 pg (ref 26.0–34.0)
MCHC: 32.1 g/dL (ref 30.0–36.0)
MCV: 98.2 fL (ref 80.0–100.0)
Platelets: 229 10*3/uL (ref 150–400)
RBC: 4.89 MIL/uL (ref 3.87–5.11)
RDW: 12.5 % (ref 11.5–15.5)
WBC: 4.9 10*3/uL (ref 4.0–10.5)
nRBC: 0 % (ref 0.0–0.2)

## 2018-07-17 LAB — BASIC METABOLIC PANEL
ANION GAP: 8 (ref 5–15)
BUN: 26 mg/dL — ABNORMAL HIGH (ref 8–23)
CO2: 27 mmol/L (ref 22–32)
Calcium: 9.6 mg/dL (ref 8.9–10.3)
Chloride: 103 mmol/L (ref 98–111)
Creatinine, Ser: 0.91 mg/dL (ref 0.44–1.00)
GFR calc non Af Amer: >60 mL/min (ref >60)
Glucose, Bld: 83 mg/dL (ref 70–99)
Potassium: 4.3 mmol/L (ref 3.5–5.1)
SODIUM: 138 mmol/L (ref 135–145)

## 2018-07-17 NOTE — Patient Instructions (Signed)
Dawn [name]  07/17/2018   Your procedure is scheduled on: 07-23-2018   Report to [facility] Main  Entrance     Report to admitting at 8:00AM    Call this number if you have problems the morning of surgery [phone]      Remember: Do not eat food or drink liquids :After Midnight. BRUSH YOUR TEETH MORNING OF SURGERY AND RINSE YOUR MOUTH OUT, NO CHEWING GUM CANDY OR MINTS.     Take these medicines the morning of surgery with A SIP OF WATER: (rosuvastatin)Crestor                                You may not have any metal on your body including hair pins and              piercings  Do not wear jewelry, make-up, lotions, powders or perfumes, deodorant             Do not wear nail polish.  Do not shave  48 hours prior to surgery.             Do not bring valuables to the hospital. [place].  Contacts, dentures or bridgework may not be worn into surgery.  Leave suitcase in the car. After surgery it may be brought to your room.                   Please read over the following fact sheets you were given: _____________________________________________________________________             [facility] - Preparing for Surgery Before surgery, you can play an important role.  Because skin is not sterile, your skin needs to be as free of germs as possible.  You can reduce the number of germs on your skin by washing with CHG (chlorahexidine gluconate) soap before surgery.  CHG is an antiseptic cleaner which kills germs and bonds with the skin to continue killing germs even after washing. Please DO NOT use if you have an allergy to CHG or antibacterial soaps.  If your skin becomes reddened/irritated stop using the CHG and inform your nurse when you arrive at Short Stay. Do not shave (including legs and underarms) for at least 48 hours prior to the first CHG shower.  You may shave your face/neck. Please follow  these instructions carefully:  1.  Shower with CHG Soap the night before surgery and the  morning of Surgery.  2.  If you choose to wash your hair, wash your hair first as usual with your  normal  shampoo.  3.  After you shampoo, rinse your hair and body thoroughly to remove the  shampoo.                           4.  Use CHG as you would any other liquid soap.  You can apply chg directly  to the skin and wash                       Gently with a scrungie or clean washcloth.  5.  Apply the CHG Soap to your body ONLY FROM THE NECK DOWN.  Do not use on face/ open                           Wound or open sores. Avoid contact with eyes, ears mouth and genitals (private parts).                       Wash face,  Genitals (private parts) with your normal soap.             6.  Wash thoroughly, paying special attention to the area where your surgery  will be performed.  7.  Thoroughly rinse your body with warm water from the neck down.  8.  DO NOT shower/wash with your normal soap after using and rinsing off  the CHG Soap.                9.  Pat yourself dry with a clean towel.            10.  Wear clean pajamas.            11.  Place clean sheets on your bed the night of your first shower and do not  sleep with pets. Day of Surgery : Do not apply any lotions/deodorants the morning of surgery.  Please wear clean clothes to the hospital/surgery center.  FAILURE TO FOLLOW THESE INSTRUCTIONS MAY RESULT IN THE CANCELLATION OF YOUR SURGERY PATIENT SIGNATURE_________________________________  NURSE SIGNATURE__________________________________  ________________________________________________________________________   Dawn [name]  An incentive spirometer is a tool that can help keep your lungs clear and active. This tool measures how well you are filling your lungs with each breath. Taking long deep breaths may help reverse or decrease the chance of developing breathing (pulmonary) problems  (especially infection) following:  A long period of time when you are unable to move or be active. BEFORE THE PROCEDURE   If the spirometer includes an indicator to show your best effort, your nurse or respiratory therapist will set it to a desired goal.  If possible, sit up straight or lean slightly forward. Try not to slouch.  Hold the incentive spirometer in an upright position. INSTRUCTIONS FOR USE  1. Sit on the edge of your bed if possible, or sit up as far as you can in bed or on a chair. 2. Hold the incentive spirometer in an upright position. 3. Breathe out normally. 4. Place the mouthpiece in your mouth and seal your lips tightly around it. 5. Breathe in slowly and as deeply as possible, raising the piston or the ball toward the top of the column. 6. Hold your breath for 3-5 seconds or for as long as possible. Allow the piston or ball to fall to the bottom of the column. 7. Remove the mouthpiece from your mouth and breathe out normally. 8. Rest for a few seconds and repeat Steps 1 through 7 at least 10 times every 1-2 hours when you are awake. Take your time and take a few normal breaths between deep breaths. 9. The spirometer may include an indicator to show your best effort. Use the indicator as a goal to work toward during each repetition. 10. After each set of 10 deep breaths, practice coughing to be sure your lungs are clear. If you have an incision (the cut made at the time of surgery), support your incision when coughing by placing a pillow or rolled up towels firmly against it. Once you are able to get out of  bed, walk around indoors and cough well. You may stop using the incentive spirometer when instructed by your caregiver.  RISKS AND COMPLICATIONS  Take your time so you do not get dizzy or light-headed.  If you are in pain, you may need to take or ask for pain medication before doing incentive spirometry. It is harder to take a deep breath if you are having  pain. AFTER USE  Rest and breathe slowly and easily.  It can be helpful to keep track of a log of your progress. Your caregiver can provide you with a simple table to help with this. If you are using the spirometer at home, follow these instructions: [place] IF:   You are having difficultly using the spirometer.  You have trouble using the spirometer as often as instructed.  Your pain medication is not giving enough relief while using the spirometer.  You develop fever of 100.5 F (38.1 C) or higher. SEEK IMMEDIATE MEDICAL CARE IF:   You cough up bloody sputum that had not been present before.  You develop fever of 102 F (38.9 C) or greater.  You develop worsening pain at or near the incision site. MAKE SURE YOU:   Understand these instructions.  Will watch your condition.  Will get help right away if you are not doing well or get worse. Document Released: 09/09/2006 Document Revised: 07/22/2011 Document Reviewed: 11/10/2006 ExitCare Patient Information 2014 ExitCare, [place].   ________________________________________________________________________  WHAT IS A BLOOD TRANSFUSION? Blood Transfusion Information  A transfusion is the replacement of blood or some of its parts. Blood is made up of multiple cells which provide different functions.  Red blood cells carry oxygen and are used for blood loss replacement.  White blood cells fight against infection.  Platelets control bleeding.  Plasma helps clot blood.  Other blood products are available for specialized needs, such as hemophilia or other clotting disorders. BEFORE THE TRANSFUSION  Who gives blood for transfusions?   Healthy volunteers who are fully evaluated to make sure their blood is safe. This is blood bank blood. Transfusion therapy is the safest it has ever been in the practice of medicine. Before blood is taken from a donor, a complete history is taken to make sure that person has no history  of diseases nor engages in risky social behavior (examples are intravenous drug use or sexual activity with multiple partners). The donor's travel history is screened to minimize risk of transmitting infections, such as malaria. The donated blood is tested for signs of infectious diseases, such as HIV and hepatitis. The blood is then tested to be sure it is compatible with you in order to minimize the chance of a transfusion reaction. If you or a relative donates blood, this is often done in anticipation of surgery and is not appropriate for emergency situations. It takes many days to process the donated blood. RISKS AND COMPLICATIONS Although transfusion therapy is very safe and saves many lives, the main dangers of transfusion include:   Getting an infectious disease.  Developing a transfusion reaction. This is an allergic reaction to something in the blood you were given. Every precaution is taken to prevent this. The decision to have a blood transfusion has been considered carefully by your caregiver before blood is given. Blood is not given unless the benefits outweigh the risks. AFTER THE TRANSFUSION  Right after receiving a blood transfusion, you will usually feel much better and more energetic. This is especially true if your red blood  cells have gotten low (anemic). The transfusion raises the level of the red blood cells which carry oxygen, and this usually causes an energy increase.  The nurse administering the transfusion will monitor you carefully for complications. HOME CARE INSTRUCTIONS  No special instructions are needed after a transfusion. You may find your energy is better. Speak with your caregiver about any limitations on activity for underlying diseases you may have. SEEK MEDICAL CARE IF:   Your condition is not improving after your transfusion.  You develop redness or irritation at the intravenous (IV) site. SEEK IMMEDIATE MEDICAL CARE IF:  Any of the following symptoms  occur over the next 12 hours:  Shaking chills.  You have a temperature by mouth above 102 F (38.9 C), not controlled by medicine.  Chest, back, or muscle pain.  People around you feel you are not acting correctly or are confused.  Shortness of breath or difficulty breathing.  Dizziness and fainting.  You get a rash or develop hives.  You have a decrease in urine output.  Your urine turns a dark color or changes to pink, red, or brown. Any of the following symptoms occur over the next 10 days:  You have a temperature by mouth above 102 F (38.9 C), not controlled by medicine.  Shortness of breath.  Weakness after normal activity.  The white part of the eye turns yellow (jaundice).  You have a decrease in the amount of urine or are urinating less often.  Your urine turns a dark color or changes to pink, red, or brown. Document Released: 04/26/2000 Document Revised: 07/22/2011 Document Reviewed: 12/14/2007 [facility] Patient Information 2014 [place], [place].  _______________________________________________________________________

## 2018-07-22 ENCOUNTER — Ambulatory Visit: Payer: Medicare Other | Admitting: Internal Medicine

## 2018-07-23 ENCOUNTER — Inpatient Hospital Stay (HOSPITAL_COMMUNITY): Payer: Medicare Other | Admitting: Certified Registered"

## 2018-07-23 ENCOUNTER — Observation Stay (HOSPITAL_COMMUNITY)
Admission: RE | Admit: 2018-07-23 | Discharge: 2018-07-24 | Disposition: A | Discharge status: Home / Self Care | Payer: Medicare Other | Attending: Orthopedic Surgery | Admitting: Orthopedic Surgery

## 2018-07-23 ENCOUNTER — Encounter (HOSPITAL_COMMUNITY)
Admission: RE | Disposition: A | Discharge status: Home / Self Care | Payer: Self-pay | Source: Home / Self Care | Attending: Orthopedic Surgery

## 2018-07-23 ENCOUNTER — Encounter (HOSPITAL_COMMUNITY): Payer: Self-pay | Admitting: Emergency Medicine

## 2018-07-23 ENCOUNTER — Inpatient Hospital Stay (HOSPITAL_COMMUNITY): Payer: Medicare Other | Admitting: Physician Assistant

## 2018-07-23 ENCOUNTER — Other Ambulatory Visit: Payer: Self-pay

## 2018-07-23 ENCOUNTER — Other Ambulatory Visit: Payer: Self-pay | Admitting: Orthopedic Surgery

## 2018-07-23 DIAGNOSIS — Z96652 Presence of left artificial knee joint: Secondary | ICD-10-CM | POA: Insufficient documentation

## 2018-07-23 DIAGNOSIS — Z87891 Personal history of nicotine dependence: Secondary | ICD-10-CM | POA: Insufficient documentation

## 2018-07-23 DIAGNOSIS — Z888 Allergy status to other drugs, medicaments and biological substances status: Secondary | ICD-10-CM | POA: Diagnosis not present

## 2018-07-23 DIAGNOSIS — Z8249 Family history of ischemic heart disease and other diseases of the circulatory system: Secondary | ICD-10-CM | POA: Insufficient documentation

## 2018-07-23 DIAGNOSIS — R262 Difficulty in walking, not elsewhere classified: Secondary | ICD-10-CM | POA: Insufficient documentation

## 2018-07-23 DIAGNOSIS — I10 Essential (primary) hypertension: Secondary | ICD-10-CM | POA: Diagnosis not present

## 2018-07-23 DIAGNOSIS — Z79899 Other long term (current) drug therapy: Secondary | ICD-10-CM | POA: Insufficient documentation

## 2018-07-23 DIAGNOSIS — G8918 Other acute postprocedural pain: Secondary | ICD-10-CM | POA: Diagnosis not present

## 2018-07-23 DIAGNOSIS — M1711 Unilateral primary osteoarthritis, right knee: Secondary | ICD-10-CM | POA: Diagnosis not present

## 2018-07-23 DIAGNOSIS — Z791 Long term (current) use of non-steroidal anti-inflammatories (NSAID): Secondary | ICD-10-CM | POA: Diagnosis not present

## 2018-07-23 DIAGNOSIS — Z96651 Presence of right artificial knee joint: Secondary | ICD-10-CM

## 2018-07-23 DIAGNOSIS — Z96659 Presence of unspecified artificial knee joint: Secondary | ICD-10-CM

## 2018-07-23 DIAGNOSIS — E785 Hyperlipidemia, unspecified: Secondary | ICD-10-CM | POA: Diagnosis not present

## 2018-07-23 DIAGNOSIS — R2689 Other abnormalities of gait and mobility: Secondary | ICD-10-CM | POA: Insufficient documentation

## 2018-07-23 HISTORY — PX: TOTAL KNEE ARTHROPLASTY: SHX125

## 2018-07-23 LAB — TYPE AND SCREEN
ABO/RH(D): O POS
Antibody Screen: NEGATIVE

## 2018-07-23 SURGERY — ARTHROPLASTY, KNEE, TOTAL
Anesthesia: Spinal | Site: Knee | Laterality: Right

## 2018-07-23 MED ORDER — METOCLOPRAMIDE HCL 5 MG/ML IJ SOLN: 5.0000 mg | Freq: Three times a day (TID) | INTRAMUSCULAR | Status: DC | PRN

## 2018-07-23 MED ORDER — LACTATED RINGERS IV SOLN
INTRAVENOUS | Status: DC
  Administered 2018-07-23 (×2): via INTRAVENOUS

## 2018-07-23 MED ORDER — METOCLOPRAMIDE HCL 5 MG PO TABS: 5.0000 mg | ORAL_TABLET | Freq: Three times a day (TID) | ORAL | Status: DC | PRN

## 2018-07-23 MED ORDER — BUPIVACAINE HCL (PF) 0.25 % IJ SOLN
INTRAMUSCULAR | Status: AC
  Filled 2018-07-23: qty 30

## 2018-07-23 MED ORDER — IRBESARTAN 150 MG PO TABS
150.0000 mg | ORAL_TABLET | Freq: Every day | ORAL | Status: DC
  Administered 2018-07-24: 150 mg via ORAL
  Filled 2018-07-23: qty 1

## 2018-07-23 MED ORDER — KETOROLAC TROMETHAMINE 30 MG/ML IJ SOLN
INTRAMUSCULAR | Status: DC | PRN
  Administered 2018-07-23: 30 mg

## 2018-07-23 MED ORDER — POLYETHYLENE GLYCOL 3350 17 G PO PACK
17.0000 g | PACK | Freq: Two times a day (BID) | ORAL | Status: DC
  Administered 2018-07-23 – 2018-07-24 (×2): 17 g via ORAL
  Filled 2018-07-23 (×2): qty 1

## 2018-07-23 MED ORDER — BUPIVACAINE-EPINEPHRINE (PF) 0.25% -1:200000 IJ SOLN
INTRAMUSCULAR | Status: DC | PRN
  Administered 2018-07-23: 30 mL

## 2018-07-23 MED ORDER — TRANEXAMIC ACID-NACL 1000-0.7 MG/100ML-% IV SOLN
1000.0000 mg | Freq: Once | INTRAVENOUS | Status: AC
  Administered 2018-07-23: 1000 mg via INTRAVENOUS
  Filled 2018-07-23: qty 100

## 2018-07-23 MED ORDER — DIPHENHYDRAMINE HCL 12.5 MG/5ML PO ELIX: 12.5000 mg | ORAL_SOLUTION | ORAL | Status: DC | PRN

## 2018-07-23 MED ORDER — FERROUS SULFATE 325 (65 FE) MG PO TABS
325.0000 mg | ORAL_TABLET | Freq: Two times a day (BID) | ORAL | Status: DC
  Administered 2018-07-24: 325 mg via ORAL
  Filled 2018-07-23: qty 1

## 2018-07-23 MED ORDER — DEXAMETHASONE SODIUM PHOSPHATE 10 MG/ML IJ SOLN
INTRAMUSCULAR | Status: DC | PRN
  Administered 2018-07-23: 10 mg via INTRAVENOUS

## 2018-07-23 MED ORDER — CHLORHEXIDINE GLUCONATE 4 % EX LIQD: 60.0000 mL | Freq: Once | CUTANEOUS | Status: DC

## 2018-07-23 MED ORDER — METHOCARBAMOL 500 MG PO TABS
500.0000 mg | ORAL_TABLET | Freq: Four times a day (QID) | ORAL | Status: DC | PRN
  Administered 2018-07-23 – 2018-07-24 (×2): 500 mg via ORAL
  Filled 2018-07-23 (×2): qty 1

## 2018-07-23 MED ORDER — CELECOXIB 200 MG PO CAPS
200.0000 mg | ORAL_CAPSULE | Freq: Two times a day (BID) | ORAL | Status: DC
  Administered 2018-07-23 – 2018-07-24 (×2): 200 mg via ORAL
  Filled 2018-07-23 (×2): qty 1

## 2018-07-23 MED ORDER — SODIUM CHLORIDE 0.9 % IV SOLN
INTRAVENOUS | Status: DC
  Administered 2018-07-23: 17:00:00 via INTRAVENOUS

## 2018-07-23 MED ORDER — PROPOFOL 10 MG/ML IV BOLUS
INTRAVENOUS | Status: AC
  Filled 2018-07-23: qty 40

## 2018-07-23 MED ORDER — DEXAMETHASONE SODIUM PHOSPHATE 10 MG/ML IJ SOLN
INTRAMUSCULAR | Status: AC
  Filled 2018-07-23: qty 1

## 2018-07-23 MED ORDER — ROSUVASTATIN CALCIUM 5 MG PO TABS
5.0000 mg | ORAL_TABLET | Freq: Every day | ORAL | Status: DC
  Administered 2018-07-24: 5 mg via ORAL
  Filled 2018-07-23: qty 1

## 2018-07-23 MED ORDER — BISACODYL 10 MG RE SUPP: 10.0000 mg | Freq: Every day | RECTAL | Status: DC | PRN

## 2018-07-23 MED ORDER — CEFAZOLIN SODIUM-DEXTROSE 2-4 GM/100ML-% IV SOLN
2.0000 g | Freq: Four times a day (QID) | INTRAVENOUS | Status: AC
  Administered 2018-07-23 (×2): 2 g via INTRAVENOUS
  Filled 2018-07-23 (×2): qty 100

## 2018-07-23 MED ORDER — ONDANSETRON HCL 4 MG PO TABS: 4.0000 mg | ORAL_TABLET | Freq: Four times a day (QID) | ORAL | Status: DC | PRN

## 2018-07-23 MED ORDER — ASPIRIN 81 MG PO CHEW
81.0000 mg | CHEWABLE_TABLET | Freq: Two times a day (BID) | ORAL | Status: DC
  Administered 2018-07-23 – 2018-07-24 (×2): 81 mg via ORAL
  Filled 2018-07-23 (×2): qty 1

## 2018-07-23 MED ORDER — SODIUM CHLORIDE (PF) 0.9 % IJ SOLN
INTRAMUSCULAR | Status: AC
  Filled 2018-07-23: qty 50

## 2018-07-23 MED ORDER — HYDROCHLOROTHIAZIDE 12.5 MG PO CAPS
12.5000 mg | ORAL_CAPSULE | Freq: Every day | ORAL | Status: DC
  Administered 2018-07-24: 12.5 mg via ORAL
  Filled 2018-07-23: qty 1

## 2018-07-23 MED ORDER — CEFAZOLIN SODIUM-DEXTROSE 2-4 GM/100ML-% IV SOLN
INTRAVENOUS | Status: AC
  Filled 2018-07-23: qty 100

## 2018-07-23 MED ORDER — METHOCARBAMOL 500 MG IVPB - SIMPLE MED
500.0000 mg | Freq: Four times a day (QID) | INTRAVENOUS | Status: DC | PRN
  Filled 2018-07-23: qty 50

## 2018-07-23 MED ORDER — HYDROCODONE-ACETAMINOPHEN 5-325 MG PO TABS
1.0000 | ORAL_TABLET | ORAL | Status: DC | PRN
  Administered 2018-07-23 – 2018-07-24 (×5): 2 via ORAL
  Filled 2018-07-23 (×5): qty 2

## 2018-07-23 MED ORDER — EPHEDRINE SULFATE-NACL 50-0.9 MG/10ML-% IV SOSY
PREFILLED_SYRINGE | INTRAVENOUS | Status: DC | PRN
  Administered 2018-07-23: 10 mg via INTRAVENOUS

## 2018-07-23 MED ORDER — MAGNESIUM CITRATE PO SOLN: 1.0000 | Freq: Once | ORAL | Status: DC | PRN

## 2018-07-23 MED ORDER — ONDANSETRON HCL 4 MG/2ML IJ SOLN
INTRAMUSCULAR | Status: DC | PRN
  Administered 2018-07-23: 4 mg via INTRAVENOUS

## 2018-07-23 MED ORDER — BUPIVACAINE IN DEXTROSE 0.75-8.25 % IT SOLN
INTRATHECAL | Status: DC | PRN
  Administered 2018-07-23: 1.6 mL via INTRATHECAL

## 2018-07-23 MED ORDER — DOCUSATE SODIUM 100 MG PO CAPS
100.0000 mg | ORAL_CAPSULE | Freq: Two times a day (BID) | ORAL | Status: DC
  Administered 2018-07-23 – 2018-07-24 (×2): 100 mg via ORAL
  Filled 2018-07-23 (×2): qty 1

## 2018-07-23 MED ORDER — ALUM & MAG HYDROXIDE-SIMETH 200-200-20 MG/5ML PO SUSP: 15.0000 mL | ORAL | Status: DC | PRN

## 2018-07-23 MED ORDER — MIDAZOLAM HCL 2 MG/2ML IJ SOLN
INTRAMUSCULAR | Status: DC | PRN
  Administered 2018-07-23: 2 mg via INTRAVENOUS

## 2018-07-23 MED ORDER — TRANEXAMIC ACID-NACL 1000-0.7 MG/100ML-% IV SOLN
1000.0000 mg | INTRAVENOUS | Status: AC
  Administered 2018-07-23: 1000 mg via INTRAVENOUS

## 2018-07-23 MED ORDER — DEXAMETHASONE SODIUM PHOSPHATE 10 MG/ML IJ SOLN
10.0000 mg | Freq: Once | INTRAMUSCULAR | Status: AC
  Administered 2018-07-24: 10 mg via INTRAVENOUS
  Filled 2018-07-23: qty 1

## 2018-07-23 MED ORDER — PHENYLEPHRINE HCL 10 MG/ML IJ SOLN
INTRAMUSCULAR | Status: AC
  Filled 2018-07-23: qty 1

## 2018-07-23 MED ORDER — PHENYLEPHRINE HCL 10 MG/ML IJ SOLN
INTRAVENOUS | Status: DC | PRN
  Administered 2018-07-23: 35 ug/min via INTRAVENOUS

## 2018-07-23 MED ORDER — HYDROCODONE-ACETAMINOPHEN 7.5-325 MG PO TABS: 1.0000 | ORAL_TABLET | ORAL | Status: DC | PRN

## 2018-07-23 MED ORDER — CEFAZOLIN SODIUM-DEXTROSE 2-4 GM/100ML-% IV SOLN
2.0000 g | INTRAVENOUS | Status: AC
  Administered 2018-07-23: 2 g via INTRAVENOUS

## 2018-07-23 MED ORDER — ONDANSETRON HCL 4 MG/2ML IJ SOLN: 4.0000 mg | Freq: Four times a day (QID) | INTRAMUSCULAR | Status: DC | PRN

## 2018-07-23 MED ORDER — KETOROLAC TROMETHAMINE 30 MG/ML IJ SOLN
INTRAMUSCULAR | Status: AC
  Filled 2018-07-23: qty 1

## 2018-07-23 MED ORDER — FENTANYL CITRATE (PF) 100 MCG/2ML IJ SOLN
50.0000 ug | INTRAMUSCULAR | Status: DC
  Administered 2018-07-23 (×2): 50 ug via INTRAVENOUS
  Filled 2018-07-23: qty 2

## 2018-07-23 MED ORDER — PROPOFOL 500 MG/50ML IV EMUL
INTRAVENOUS | Status: DC | PRN
  Administered 2018-07-23: 135 ug/kg/min via INTRAVENOUS

## 2018-07-23 MED ORDER — ACETAMINOPHEN 325 MG PO TABS: 325.0000 mg | ORAL_TABLET | Freq: Four times a day (QID) | ORAL | Status: DC | PRN

## 2018-07-23 MED ORDER — EPHEDRINE 5 MG/ML INJ
INTRAVENOUS | Status: AC
  Filled 2018-07-23: qty 10

## 2018-07-23 MED ORDER — SODIUM CHLORIDE (PF) 0.9 % IJ SOLN
INTRAMUSCULAR | Status: DC | PRN
  Administered 2018-07-23: 30 mL

## 2018-07-23 MED ORDER — OLMESARTAN MEDOXOMIL-HCTZ 20-12.5 MG PO TABS: 1.0000 | ORAL_TABLET | Freq: Every day | ORAL | Status: DC

## 2018-07-23 MED ORDER — MENTHOL 3 MG MT LOZG: 1.0000 | LOZENGE | OROMUCOSAL | Status: DC | PRN

## 2018-07-23 MED ORDER — MIDAZOLAM HCL 2 MG/2ML IJ SOLN
1.0000 mg | INTRAMUSCULAR | Status: DC
  Filled 2018-07-23: qty 2

## 2018-07-23 MED ORDER — TRIAMCINOLONE ACETONIDE 55 MCG/ACT NA AERO
2.0000 | INHALATION_SPRAY | Freq: Every day | NASAL | Status: DC | PRN
  Filled 2018-07-23: qty 21.6

## 2018-07-23 MED ORDER — PROPOFOL 10 MG/ML IV BOLUS
INTRAVENOUS | Status: AC
  Filled 2018-07-23: qty 20

## 2018-07-23 MED ORDER — DEXAMETHASONE SODIUM PHOSPHATE 10 MG/ML IJ SOLN: 10.0000 mg | Freq: Once | INTRAMUSCULAR | Status: DC

## 2018-07-23 MED ORDER — SODIUM CHLORIDE 0.9 % IR SOLN
Status: DC | PRN
  Administered 2018-07-23 (×2): 1000 mL

## 2018-07-23 MED ORDER — ONDANSETRON HCL 4 MG/2ML IJ SOLN
INTRAMUSCULAR | Status: AC
  Filled 2018-07-23: qty 2

## 2018-07-23 MED ORDER — TRANEXAMIC ACID-NACL 1000-0.7 MG/100ML-% IV SOLN
INTRAVENOUS | Status: AC
  Filled 2018-07-23: qty 100

## 2018-07-23 MED ORDER — FENTANYL CITRATE (PF) 100 MCG/2ML IJ SOLN: 25.0000 ug | INTRAMUSCULAR | Status: DC | PRN

## 2018-07-23 MED ORDER — MORPHINE SULFATE (PF) 2 MG/ML IV SOLN: 0.5000 mg | INTRAVENOUS | Status: DC | PRN

## 2018-07-23 MED ORDER — PHENOL 1.4 % MT LIQD
1.0000 | OROMUCOSAL | Status: DC | PRN
  Filled 2018-07-23: qty 177

## 2018-07-23 SURGICAL SUPPLY — 63 items
ADH SKN CLS APL DERMABOND .7 (GAUZE/BANDAGES/DRESSINGS) ×1
APL PRP STRL LF DISP 70% ISPRP (MISCELLANEOUS) ×2
ATTUNE MED ANAT PAT 35 KNEE (Knees) ×1 IMPLANT
ATTUNE PSFEM RTSZ5 NARCEM KNEE (Femur) ×1 IMPLANT
ATTUNE PSRP INSR SZ5 6 KNEE (Insert) ×1 IMPLANT
BAG SPEC THK2 15X12 ZIP CLS (MISCELLANEOUS) ×1
BAG ZIPLOCK 12X15 (MISCELLANEOUS) ×1 IMPLANT
BANDAGE ACE 6X5 VEL STRL LF (GAUZE/BANDAGES/DRESSINGS) ×2 IMPLANT
BASE TIBIAL ROT PLAT SZ 5 KNEE (Knees) IMPLANT
BLADE SAW SGTL 11.0X1.19X90.0M (BLADE) IMPLANT
BLADE SAW SGTL 13.0X1.19X90.0M (BLADE) ×2 IMPLANT
BLADE SURG SZ10 CARB STEEL (BLADE) ×4 IMPLANT
BOWL SMART MIX CTS (DISPOSABLE) ×2 IMPLANT
BSPLAT TIB 5 CMNT ROT PLAT STR (Knees) ×1 IMPLANT
CEMENT HV SMART SET (Cement) ×2 IMPLANT
CHLORAPREP W/TINT 26 (MISCELLANEOUS) ×4 IMPLANT
COVER SURGICAL LIGHT HANDLE (MISCELLANEOUS) ×2 IMPLANT
COVER WAND RF STERILE (DRAPES) IMPLANT
CUFF TOURN SGL QUICK 34 (TOURNIQUET CUFF) ×2
CUFF TRNQT CYL 34X4.125X (TOURNIQUET CUFF) ×1 IMPLANT
DECANTER SPIKE VIAL GLASS SM (MISCELLANEOUS) ×4 IMPLANT
DERMABOND ADVANCED (GAUZE/BANDAGES/DRESSINGS) ×1
DERMABOND ADVANCED .7 DNX12 (GAUZE/BANDAGES/DRESSINGS) ×1 IMPLANT
DRAPE U-SHAPE 47X51 STRL (DRAPES) ×2 IMPLANT
DRESSING AQUACEL AG SP 3.5X10 (GAUZE/BANDAGES/DRESSINGS) ×1 IMPLANT
DRSG AQUACEL AG SP 3.5X10 (GAUZE/BANDAGES/DRESSINGS) ×2
ELECT REM PT RETURN 15FT ADLT (MISCELLANEOUS) ×2 IMPLANT
GLOVE BIO SURGEON STRL SZ 6 (GLOVE) ×2 IMPLANT
GLOVE BIOGEL PI IND STRL 6.5 (GLOVE) ×1 IMPLANT
GLOVE BIOGEL PI IND STRL 7.5 (GLOVE) ×1 IMPLANT
GLOVE BIOGEL PI IND STRL 8.5 (GLOVE) ×1 IMPLANT
GLOVE BIOGEL PI INDICATOR 6.5 (GLOVE) ×1
GLOVE BIOGEL PI INDICATOR 7.5 (GLOVE) ×1
GLOVE BIOGEL PI INDICATOR 8.5 (GLOVE) ×1
GLOVE ECLIPSE 8.0 STRL XLNG CF (GLOVE) ×2 IMPLANT
GLOVE ORTHO TXT STRL SZ7.5 (GLOVE) ×2 IMPLANT
GOWN STRL REUS W/ TWL LRG LVL3 (GOWN DISPOSABLE) ×1 IMPLANT
GOWN STRL REUS W/TWL 2XL LVL3 (GOWN DISPOSABLE) ×2 IMPLANT
GOWN STRL REUS W/TWL LRG LVL3 (GOWN DISPOSABLE) ×4 IMPLANT
HANDPIECE INTERPULSE COAX TIP (DISPOSABLE) ×2
HOLDER FOLEY CATH W/STRAP (MISCELLANEOUS) ×1 IMPLANT
KIT TURNOVER KIT A (KITS) IMPLANT
MANIFOLD NEPTUNE II (INSTRUMENTS) ×2 IMPLANT
NDL SAFETY ECLIPSE 18X1.5 (NEEDLE) IMPLANT
NEEDLE HYPO 18GX1.5 SHARP (NEEDLE) ×2
NS IRRIG 1000ML POUR BTL (IV SOLUTION) ×2 IMPLANT
PACK TOTAL KNEE CUSTOM (KITS) ×2 IMPLANT
PIN FIX SIGMA HP QUICK REL (PIN) ×1 IMPLANT
PIN THREADED HEADED SIGMA (PIN) ×1 IMPLANT
PROTECTOR NERVE ULNAR (MISCELLANEOUS) ×2 IMPLANT
SET HNDPC FAN SPRY TIP SCT (DISPOSABLE) ×1 IMPLANT
SET PAD KNEE POSITIONER (MISCELLANEOUS) ×2 IMPLANT
SUT MNCRL AB 4-0 PS2 18 (SUTURE) ×2 IMPLANT
SUT STRATAFIX PDS+ 0 24IN (SUTURE) ×2 IMPLANT
SUT VIC AB 1 CT1 36 (SUTURE) ×2 IMPLANT
SUT VIC AB 2-0 CT1 27 (SUTURE) ×6
SUT VIC AB 2-0 CT1 TAPERPNT 27 (SUTURE) ×3 IMPLANT
SYR 3ML LL SCALE MARK (SYRINGE) ×3 IMPLANT
TIBIAL BASE ROT PLAT SZ 5 KNEE (Knees) ×2 IMPLANT
TRAY FOLEY MTR SLVR 16FR STAT (SET/KITS/TRAYS/PACK) ×1 IMPLANT
WATER STERILE IRR 1000ML POUR (IV SOLUTION) ×4 IMPLANT
WRAP KNEE MAXI GEL POST OP (GAUZE/BANDAGES/DRESSINGS) ×2 IMPLANT
YANKAUER SUCT BULB TIP 10FT TU (MISCELLANEOUS) ×2 IMPLANT

## 2018-07-23 NOTE — Discharge Instructions (Signed)

## 2018-07-23 NOTE — Transfer of Care (Signed)
Immediate Anesthesia Transfer of Care Note  Patient: Dawn [name]  Procedure(s) Performed: TOTAL KNEE ARTHROPLASTY (Right Knee)  Patient Location: PACU  Anesthesia Type:Spinal  Level of Consciousness: awake, alert  and oriented  Airway & Oxygen Therapy: Patient Spontanous Breathing and Patient connected to face mask oxygen  Post-op Assessment: Report given to RN and Post -op Vital signs reviewed and stable  Post vital signs: Reviewed and stable  Last Vitals:  Vitals Value Taken Time  BP    Temp    Pulse 73 07/23/2018 12:59 PM  Resp 12 07/23/2018 12:59 PM  SpO2 98 % 07/23/2018 12:59 PM  Vitals shown include unvalidated device data.  Last Pain:  Vitals:   07/23/18 1008  TempSrc:   PainSc: 0-No pain      Patients Stated Pain Goal: 4 (70/65/82 6088)  Complications: No apparent anesthesia complications

## 2018-07-23 NOTE — Anesthesia Preprocedure Evaluation (Addendum)
Anesthesia Evaluation  Patient identified by MRN, date of birth, ID band Patient awake    Reviewed: Allergy & Precautions, NPO status , Patient's Chart, lab work & pertinent test results  Airway Mallampati: II  TM Distance: >3 FB     Dental   Pulmonary former smoker,    breath sounds clear to auscultation       Cardiovascular hypertension,  Rhythm:Regular Rate:Normal     Neuro/Psych    GI/Hepatic negative GI ROS, Neg liver ROS,   Endo/Other  negative endocrine ROS  Renal/GU negative Renal ROS     Musculoskeletal   Abdominal   Peds  Hematology   Anesthesia Other Findings   Reproductive/Obstetrics                             Anesthesia Physical Anesthesia Plan  ASA: III  Anesthesia Plan: Spinal   Post-op Pain Management:    Induction: Intravenous  PONV Risk Score and Plan: 3 and Ondansetron, Dexamethasone and Midazolam  Airway Management Planned: Nasal Cannula and Simple Face Mask  Additional Equipment:   Intra-op Plan:   Post-operative Plan:   Informed Consent: I have reviewed the patients History and Physical, chart, labs and discussed the procedure including the risks, benefits and alternatives for the proposed anesthesia with the patient or authorized representative who has indicated his/her understanding and acceptance.     Dental advisory given  Plan Discussed with: CRNA and Anesthesiologist  Anesthesia Plan Comments:        Anesthesia Quick Evaluation

## 2018-07-23 NOTE — Interval H&P Note (Signed)
History and Physical Interval Note:  07/23/2018 10:36 AM  Dawn [name]  has presented today for surgery, with the diagnosis of Right knee osteoarthritis.  The various methods of treatment have been discussed with the patient and family. After consideration of risks, benefits and other options for treatment, the patient has consented to  Procedure(s) with comments: TOTAL KNEE ARTHROPLASTY (Right) - 70 mins as a surgical intervention.  The patient's history has been reviewed, patient examined, no change in status, stable for surgery.  I have reviewed the patient's chart and labs.  Questions were answered to the patient's satisfaction.     Mauri Pole

## 2018-07-23 NOTE — Anesthesia Postprocedure Evaluation (Signed)
Anesthesia Post Note  Patient: Dawn [name]  Procedure(s) Performed: TOTAL KNEE ARTHROPLASTY (Right Knee)     Patient location during evaluation: PACU Anesthesia Type: Spinal Level of consciousness: awake Pain management: pain level controlled Vital Signs Assessment: post-procedure vital signs reviewed and stable Cardiovascular status: stable Postop Assessment: no apparent nausea or vomiting Anesthetic complications: no    Last Vitals:  Vitals:   07/23/18 1400 07/23/18 1427  BP: 131/80 138/79  Pulse: (!) 54 (!) 53  Resp: 18 16  Temp: 36.7 C   SpO2: 100% 100%    Last Pain:  Vitals:   07/23/18 1400  TempSrc:   PainSc: 0-No pain                 Johann Santone

## 2018-07-23 NOTE — Progress Notes (Signed)
Patient reports the "block dots" she was seeing in both eyes have gone away.

## 2018-07-23 NOTE — Plan of Care (Signed)
  Problem: Education: Goal: Knowledge of the prescribed therapeutic regimen will improve Outcome: Progressing   Problem: Activity: Goal: Ability to avoid complications of mobility impairment will improve Outcome: Progressing   Problem: Activity: Goal: Range of joint motion will improve Outcome: Progressing   Problem: Clinical Measurements: Goal: Postoperative complications will be avoided or minimized Outcome: Progressing   Problem: Pain Management: Goal: Pain level will decrease with appropriate interventions Outcome: Progressing   Problem: Education: Goal: Knowledge of General Education information will improve Description Including pain rating scale, medication(s)/side effects and non-pharmacologic comfort measures Outcome: Progressing

## 2018-07-23 NOTE — Care Plan (Signed)
Ortho Bundle Case Management Note  Patient Details  Name: Dawn [name] MRN: [id number] Date of Birth: 1950/05/27  R TKA scheduled on 07-23-2018 DCP: Home with spouse.  1 story home with 2 ste. DME:  RW ordered through [place].  Borrowing a 3-in-1. PT:  [facility].  PT eval scheduled on 07-27-2018.                   DME Arranged:  Gilford Rile DME Agency:  [facility]  HH Arranged:  NA [place] Agency:  NA  Additional Comments: Please contact me with any questions of if this plan should need to change.  Marianne Sofia, RN,CCM [facility]  [phone] 07/23/2018, 8:25 AM

## 2018-07-23 NOTE — Anesthesia Procedure Notes (Addendum)
Anesthesia Regional Block: Adductor canal block   Pre-Anesthetic Checklist: ,, timeout performed, Correct Patient, Correct Site, Correct Laterality, Correct Procedure, Correct Position, site marked, Risks and benefits discussed,  Surgical consent,  Pre-op evaluation,  At surgeon's request and post-op pain management  Laterality: Right  Prep: chloraprep       Needles:   Needle Type: Stimulator Needle - 80          Additional Needles:   Procedures: Doppler guided,,,, ultrasound used (permanent image in chart),,,,  Narrative:  Start time: 07/23/2018 10:15 AM End time: 07/23/2018 10:30 AM Injection made incrementally with aspirations every 5 mL.  Performed by: Personally  Anesthesiologist: Belinda Block, MD

## 2018-07-23 NOTE — Anesthesia Procedure Notes (Signed)
Spinal  Start time: 07/23/2018 10:58 AM End time: 07/23/2018 11:00 AM Staffing Resident/CRNA: Niel Hummer, CRNA Performed: resident/CRNA  Preanesthetic Checklist Completed: patient identified, surgical consent, pre-op evaluation, IV checked, risks and benefits discussed and monitors and equipment checked Spinal Block Patient position: sitting Prep: DuraPrep Patient monitoring: heart rate, cardiac monitor and blood pressure Approach: midline Location: L2-3 Injection technique: single-shot Needle Needle type: Pencan  Needle gauge: 24 G

## 2018-07-23 NOTE — Progress Notes (Addendum)
   07/23/18 1758  Vitals  Temp 97.7 F (36.5 C)  Temp Source Oral  BP (!) 148/88  MAP (mmHg) 104  BP Location Left Arm  BP Method Automatic  Patient Position (if appropriate) Sitting  Pulse Rate 82  Pulse Rate Source Monitor  Resp 14  Oxygen Therapy  SpO2 100 %  O2 Device Room Air    Patient states she is seeing a black dot in both eyes (otherwise vision is fine). She states she does have a history of migraines but has never had disturbances in both eyes at the same time. Patient also states she is getting a headache. Neuro assessment WDL. I will update the MD and continue to monitor the patient.

## 2018-07-23 NOTE — Evaluation (Signed)
Physical Therapy Evaluation Patient Details Name: Dawn [name] MRN: [id number] DOB: [birth year] Today's Date: 07/23/2018   History of Present Illness  68 yo female s/p R TKR on 07/23/18. PMH includes obesity, HTN, HLD, L TKR 2018.   Clinical Impression  Pt presents with R knee pain, decreased R knee ROM, increased time and effort to perform mobility tasks, and decreased tolerance for ambulation due to pain. Pt to benefit from acute PT to address deficits. Pt ambulated hallway distance with RW with min guard assist, verbal cuing for form and safety throughout. Pt educated on ankle pumps (20/hour) to perform this afternoon/evening to increase circulation, to pt's tolerance and limited by pain. Pt noting marked increased pain when leg placed in 0-5* knee extension in sitting, RN made aware. PT to progress mobility as tolerated, and will continue to follow acutely.        Follow Up Recommendations Follow surgeon's recommendation for DC plan and follow-up therapies;Supervision for mobility/OOB(Pt reports she lost RW when she moved, needs another one)    Equipment Recommendations  Rolling walker with 5" wheels    Recommendations for Other Services       Precautions / Restrictions Precautions Precautions: Fall Restrictions Weight Bearing Restrictions: No Other Position/Activity Restrictions: WBAT       Mobility  Bed Mobility Overal bed mobility: Needs Assistance Bed Mobility: Supine to Sit     Supine to sit: Min assist;HOB elevated     General bed mobility comments: min assist for RLE lifting and translation to EOB. Increased time and effort.  Transfers Overall transfer level: Needs assistance Equipment used: Rolling walker (2 wheeled) Transfers: Sit to/from Stand Sit to Stand: Min guard;From elevated surface         General transfer comment: Min guard for safety. Verbal reinforcement to place one hand on bed when rising for stability.    Ambulation/Gait Ambulation/Gait assistance: Min guard Gait Distance (Feet): 50 Feet Assistive device: Rolling walker (2 wheeled) Gait Pattern/deviations: Step-to pattern;Decreased stance time - right;Decreased weight shift to right;Antalgic Gait velocity: decr    General Gait Details: min guard for safety. Verbal cuing for upright posture, sequencing with step-to gait. Pt with increasingly antalgic gait last ~10 ft.   Stairs            Wheelchair Mobility    Modified Rankin (Stroke Patients Only)       Balance Overall balance assessment: Mild deficits observed, not formally tested                                           Pertinent Vitals/Pain Pain Assessment: 0-10 Pain Score: 5  Pain Location: R knee, post-ambulation in recliner with RLE straight  Pain Descriptors / Indicators: Sore Pain Intervention(s): Limited activity within patient's tolerance;Monitored during session;Repositioned;Ice applied    Home Living Family/patient expects to be discharged to:: Private residence Living Arrangements: Spouse/significant other Available Help at Discharge: Family;Available 24 hours/day Type of Home: House(condo) Home Access: Stairs to enter   Entrance Stairs-Number of Steps: 1+1 Home Layout: One level Home Equipment: Shower seat - built in;Cane - quad;Bedside commode      Prior Function Level of Independence: Independent               Hand Dominance   Dominant Hand: Right    Extremity/Trunk Assessment   Upper Extremity Assessment Upper Extremity Assessment: Overall WFL for tasks assessed  Lower Extremity Assessment Lower Extremity Assessment: Overall WFL for tasks assessed;RLE deficits/detail RLE Deficits / Details: suspected post-surgical weakness; able to perform ankle pumps, quad set, assisted heel slide to 60*, SLR with <10*quad lag RLE Sensation: WNL;decreased light touch(decreased sensation of gluteal region upon first sitting  EOB, improved with prolonged sitting EOB and ambulation)    Cervical / Trunk Assessment Cervical / Trunk Assessment: Normal  Communication   Communication: No difficulties  Cognition Arousal/Alertness: Awake/alert Behavior During Therapy: WFL for tasks assessed/performed Overall Cognitive Status: Within Functional Limits for tasks assessed                                        General Comments      Exercises     Assessment/Plan    PT Assessment Patient needs continued PT services  PT Problem List Decreased strength;Decreased mobility;Decreased range of motion;Decreased activity tolerance;Decreased balance;Decreased knowledge of use of DME;Pain       PT Treatment Interventions DME instruction;Functional mobility training;Balance training;Patient/family education;Gait training;Therapeutic activities;Stair training;Therapeutic exercise    PT Goals (Current goals can be found in the Care Plan section)  Acute Rehab PT Goals Patient Stated Goal: go home, return to active lifestyle PT Goal Formulation: With patient Time For Goal Achievement: 07/30/18 Potential to Achieve Goals: Good    Frequency 7X/week   Barriers to discharge        Co-evaluation               AM-PAC PT "6 Clicks" Mobility  Outcome Measure Help needed turning from your back to your side while in a flat bed without using bedrails?: A Little Help needed moving from lying on your back to sitting on the side of a flat bed without using bedrails?: A Little Help needed moving to and from a bed to a chair (including a wheelchair)?: A Little Help needed standing up from a chair using your arms (e.g., wheelchair or bedside chair)?: A Little Help needed to walk in hospital room?: A Little Help needed climbing 3-5 steps with a railing? : A Lot 6 Click Score: 17    End of Session Equipment Utilized During Treatment: Gait belt Activity Tolerance: Patient tolerated treatment well;Patient  limited by pain Patient left: in chair;with chair alarm set;with family/visitor present;with call bell/phone within reach Nurse Communication: Mobility status PT Visit Diagnosis: Other abnormalities of gait and mobility (R26.89);Difficulty in walking, not elsewhere classified (R26.2)    Time: 8882-8003 PT Time Calculation (min) (ACUTE ONLY): 23 min   Charges:   PT Evaluation $PT Eval Low Complexity: 1 Low PT Treatments $Gait Training: 8-22 mins      Julien Girt, PT Acute Rehabilitation Services Pager [phone]  Office [phone]   Acheron Sugg D Elonda Husky 07/23/2018, 5:19 PM

## 2018-07-23 NOTE — Op Note (Signed)
NAME:  Dawn [name]                      MEDICAL RECORD NO.:  [id number]                             FACILITY:  [facility]      PHYSICIAN:  Pietro Cassis. Alvan Dame, M.D.  DATE OF BIRTH:  December 13, 1950      DATE OF PROCEDURE:  07/23/2018                                     OPERATIVE REPORT         PREOPERATIVE DIAGNOSIS:  Right knee osteoarthritis.      POSTOPERATIVE DIAGNOSIS:  Right knee osteoarthritis.      FINDINGS:  The patient was noted to have complete loss of cartilage and   bone-on-bone arthritis with associated osteophytes in the medial and patellofemoral compartments of   the knee with a significant synovitis and associated effusion.  The patient had failed months of conservative treatment including medications, injection therapy, activity modification.     PROCEDURE:  Right total knee replacement.      COMPONENTS USED:  DePuy Attune rotating platform posterior stabilized knee   system, a size 5N femur, 5 tibia, size 6 mm PS AOX insert, and 35 anatomic patellar   button.      SURGEON:  Pietro Cassis. Alvan Dame, M.D.      ASSISTANT:  Pryor Curia, PA-C.      ANESTHESIA:  Regional and Spinal.      SPECIMENS:  None.      COMPLICATION:  None.      DRAINS:  None.  EBL: <150cc      TOURNIQUET TIME:   Total Tourniquet Time Documented: Thigh (Right) - 29 minutes Total: Thigh (Right) - 29 minutes  .      The patient was stable to the recovery room.      INDICATION FOR PROCEDURE:  Dawn [name] is a 68 y.o. female patient of   mine.  The patient had been seen, evaluated, and treated for months conservatively in the   office with medication, activity modification, and injections.  The patient had   radiographic changes of bone-on-bone arthritis with endplate sclerosis and osteophytes noted.  Based on the radiographic changes and failed conservative measures, the patient   decided to proceed with definitive treatment, total knee replacement.  Risks of infection, DVT, component  failure, need for revision surgery, neurovascular injury were reviewed in the office setting.  The postop course was reviewed stressing the efforts to maximize post-operative satisfaction and function.  Consent was obtained for benefit of pain   relief.      PROCEDURE IN DETAIL:  The patient was brought to the operative theater.   Once adequate anesthesia, preoperative antibiotics, 2 gm of Ancef,1 gm of Tranexamic Acid, and 10 mg of Decadron administered, the patient was positioned supine with a right thigh tourniquet placed.  The  right lower extremity was prepped and draped in sterile fashion.  A time-   out was performed identifying the patient, planned procedure, and the appropriate extremity.      The right lower extremity was placed in the [facility] leg holder.  The leg was   exsanguinated, tourniquet elevated to 250 mmHg.  A midline incision was   made  followed by median parapatellar arthrotomy.  Following initial   exposure, attention was first directed to the patella.  Precut   measurement was noted to be 23 mm.  I resected down to 14 mm and used a   35 anatomic patellar button to restore patellar height as well as cover the cut surface.      The lug holes were drilled and a metal shim was placed to protect the   patella from retractors and saw blade during the procedure.      At this point, attention was now directed to the femur.  The femoral   canal was opened with a drill, irrigated to try to prevent fat emboli.  An   intramedullary rod was passed at 5 degrees valgus, 9 mm of bone was   resected off the distal femur.  Following this resection, the tibia was   subluxated anteriorly.  Using the extramedullary guide, 2 mm of bone was resected off   the proximal medial tibia.  We confirmed the gap would be   stable medially and laterally with a size 5 spacer block as well as confirmed that the tibial cut was perpendicular in the coronal plane, checking with an alignment rod.      Once  this was done, I sized the femur to be a size 5 in the anterior-   posterior dimension, chose a narrow component based on medial and   lateral dimension.  The size 5 rotation block was then pinned in   position anterior referenced using the C-clamp to set rotation.  The   anterior, posterior, and  chamfer cuts were made without difficulty nor   notching making certain that I was along the anterior cortex to help   with flexion gap stability.      The final box cut was made off the lateral aspect of distal femur.      At this point, the tibia was sized to be a size 5.  The size 5 tray was   then pinned in position through the medial third of the tubercle,   drilled, and keel punched.  Trial reduction was now carried with a 5 femur,  5 tibia, a size 6 mm PS insert, and the 35 anatomic patella botton.  The knee was brought to full extension with good flexion stability with the patella   tracking through the trochlea without application of pressure.  Given   all these findings the trial components removed.  Final components were   opened and cement was mixed.  The knee was irrigated with normal saline solution and pulse lavage.  The synovial lining was   then injected with 30 cc of 0.25% Marcaine with epinephrine, 1 cc of Toradol and 30 cc of NS for a total of 61 cc.     Final implants were then cemented onto cleaned and dried cut surfaces of bone with the knee brought to extension with a size 6 mm PS trial insert.      Once the cement had fully cured, excess cement was removed   throughout the knee.  I confirmed that I was satisfied with the range of   motion and stability, and the final size 6 mm PS AOX insert was chosen.  It was   placed into the knee.      The tourniquet had been let down at 29 minutes.  No significant   hemostasis was required.  The extensor mechanism was then reapproximated using #1 Vicryl and #  1 Stratafix sutures with the knee   in flexion.  The   remaining wound was  closed with 2-0 Vicryl and running 4-0 Monocryl.   The knee was cleaned, dried, dressed sterilely using Dermabond and   Aquacel dressing.  The patient was then   brought to recovery room in stable condition, tolerating the procedure   well.   Please note that Physician Assistant, Griffith Citron, PA-C was present for the entirety of the case, and was utilized for pre-operative positioning, peri-operative retractor management, general facilitation of the procedure and for primary wound closure at the end of the case.              Pietro Cassis Alvan Dame, M.D.    07/23/2018 12:32 PM

## 2018-07-23 NOTE — Anesthesia Procedure Notes (Signed)
Procedure Name: MAC Date/Time: 07/23/2018 10:53 AM Performed by: Niel Hummer, CRNA Pre-anesthesia Checklist: Emergency Drugs available, Suction available, Patient identified and Patient being monitored Patient Re-evaluated:Patient Re-evaluated prior to induction Oxygen Delivery Method: Simple face mask

## 2018-07-24 ENCOUNTER — Telehealth: Payer: Self-pay | Admitting: *Deleted

## 2018-07-24 ENCOUNTER — Encounter (HOSPITAL_COMMUNITY): Payer: Self-pay | Admitting: Orthopedic Surgery

## 2018-07-24 DIAGNOSIS — M1711 Unilateral primary osteoarthritis, right knee: Secondary | ICD-10-CM | POA: Diagnosis not present

## 2018-07-24 DIAGNOSIS — Z87891 Personal history of nicotine dependence: Secondary | ICD-10-CM | POA: Diagnosis not present

## 2018-07-24 DIAGNOSIS — I10 Essential (primary) hypertension: Secondary | ICD-10-CM | POA: Diagnosis not present

## 2018-07-24 DIAGNOSIS — Z96652 Presence of left artificial knee joint: Secondary | ICD-10-CM | POA: Diagnosis not present

## 2018-07-24 DIAGNOSIS — R262 Difficulty in walking, not elsewhere classified: Secondary | ICD-10-CM | POA: Diagnosis not present

## 2018-07-24 DIAGNOSIS — R2689 Other abnormalities of gait and mobility: Secondary | ICD-10-CM | POA: Diagnosis not present

## 2018-07-24 LAB — CBC
HCT: 37.5 % (ref 36.0–46.0)
Hemoglobin: 12 g/dL (ref 12.0–15.0)
MCH: 31.7 pg (ref 26.0–34.0)
MCHC: 32 g/dL (ref 30.0–36.0)
MCV: 98.9 fL (ref 80.0–100.0)
NRBC: 0 % (ref 0.0–0.2)
PLATELETS: 216 10*3/uL (ref 150–400)
RBC: 3.79 MIL/uL — ABNORMAL LOW (ref 3.87–5.11)
RDW: 12.2 % (ref 11.5–15.5)
WBC: 10.1 10*3/uL (ref 4.0–10.5)

## 2018-07-24 LAB — BASIC METABOLIC PANEL
Anion gap: 10 (ref 5–15)
BUN: 36 mg/dL — ABNORMAL HIGH (ref 8–23)
CO2: 22 mmol/L (ref 22–32)
Calcium: 8.7 mg/dL — ABNORMAL LOW (ref 8.9–10.3)
Chloride: 106 mmol/L (ref 98–111)
Creatinine, Ser: 0.92 mg/dL (ref 0.44–1.00)
GFR calc Af Amer: >60 mL/min (ref >60)
Glucose, Bld: 117 mg/dL — ABNORMAL HIGH (ref 70–99)
Potassium: 4.4 mmol/L (ref 3.5–5.1)
Sodium: 138 mmol/L (ref 135–145)

## 2018-07-24 MED ORDER — ASPIRIN 81 MG PO CHEW: 81.0000 mg | CHEWABLE_TABLET | Freq: Two times a day (BID) | ORAL | 0 refills | Status: AC

## 2018-07-24 MED ORDER — HYDROCODONE-ACETAMINOPHEN 7.5-325 MG PO TABS: 1.0000 | ORAL_TABLET | ORAL | 0 refills | Status: DC | PRN

## 2018-07-24 MED ORDER — FERROUS SULFATE 325 (65 FE) MG PO TABS: 325.0000 mg | ORAL_TABLET | Freq: Two times a day (BID) | ORAL | 3 refills | Status: DC

## 2018-07-24 MED ORDER — METHOCARBAMOL 500 MG PO TABS: 500.0000 mg | ORAL_TABLET | Freq: Four times a day (QID) | ORAL | 0 refills | Status: DC | PRN

## 2018-07-24 MED ORDER — CELECOXIB 200 MG PO CAPS: 200.0000 mg | ORAL_CAPSULE | Freq: Two times a day (BID) | ORAL | 0 refills | Status: DC

## 2018-07-24 NOTE — Telephone Encounter (Signed)
Pt was on TCM report being admitted for Right knee primary OA / pain. After the preoperative pt underwent right total knee arthroplasty procedure. Pt tolerated procedure well, and was D/C 07/24/18. She will follow-up w/[facility] in 2 weeks.Marland KitchenJohny Chess

## 2018-07-24 NOTE — Plan of Care (Signed)
Pt alert and oriented, doing well this am. Worked with therapy and ready to d/c home today per MD order.  Pain well controlled. RN will monitor.
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
## 2018-07-24 NOTE — Progress Notes (Signed)
Discharge paperwork discussed with pt at the bedside.  She demonstrated understanding.  Pt to be escorted by wheelchair to main lobby when husband arrives.

## 2018-07-24 NOTE — Progress Notes (Signed)
Physical Therapy Treatment Patient Details Name: Dawn [name] MRN: [id number] DOB: [birth year] Today's Date: 07/24/2018    History of Present Illness 68 yo female s/p R TKR on 07/23/18. PMH includes obesity, HTN, HLD, L TKR 2018.     PT Comments    Pt motivated and progressing well with mobility.  Pt reviewed stairs and home therex program with written instruction provided.   Follow Up Recommendations  Follow surgeon's recommendation for DC plan and follow-up therapies;Supervision for mobility/OOB     Equipment Recommendations  Rolling walker with 5" wheels    Recommendations for Other Services       Precautions / Restrictions Precautions Precautions: Fall Restrictions Weight Bearing Restrictions: No Other Position/Activity Restrictions: WBAT     Mobility  Bed Mobility Overal bed mobility: Needs Assistance Bed Mobility: Supine to Sit     Supine to sit: Supervision;HOB elevated        Transfers Overall transfer level: Needs assistance Equipment used: Rolling walker (2 wheeled) Transfers: Sit to/from Stand Sit to Stand: Min guard;Supervision         General transfer comment: cues for LE management and use of UEs to self assist  Ambulation/Gait Ambulation/Gait assistance: Min guard;Supervision Gait Distance (Feet): 150 Feet Assistive device: Rolling walker (2 wheeled) Gait Pattern/deviations: Decreased stance time - right;Decreased weight shift to right;Antalgic;Step-to pattern;Step-through pattern Gait velocity: decr    General Gait Details: cues for posture, position from RW and initial sequence   Stairs Stairs: Yes Stairs assistance: Min assist Stair Management: No rails;Step to pattern;Forwards;With walker Number of Stairs: 2 General stair comments: single step twice with cues for sequence and foot/RW placement   Wheelchair Mobility    Modified Rankin (Stroke Patients Only)       Balance Overall balance assessment: Mild deficits  observed, not formally tested                                          Cognition Arousal/Alertness: Awake/alert Behavior During Therapy: WFL for tasks assessed/performed Overall Cognitive Status: Within Functional Limits for tasks assessed                                        Exercises Total Joint Exercises Ankle Circles/Pumps: AROM;Both;15 reps;Supine Quad Sets: AROM;Both;10 reps;Supine Heel Slides: AAROM;Right;15 reps;Supine Straight Leg Raises: AAROM;AROM;Right;15 reps;Supine Long Arc Quad: AAROM;AROM;Right;10 reps;Seated Knee Flexion: AROM;AAROM;Right;10 reps;Seated Goniometric ROM: AAROM R knee - 5 - 60    General Comments        Pertinent Vitals/Pain Pain Assessment: 0-10 Pain Score: 5  Pain Location: R knee, post-ambulation in recliner with RLE straight  Pain Descriptors / Indicators: Sore Pain Intervention(s): Limited activity within patient's tolerance;Monitored during session;Premedicated before session;Ice applied    Home Living                      Prior Function            PT Goals (current goals can now be found in the care plan section) Acute Rehab PT Goals Patient Stated Goal: go home, return to active lifestyle PT Goal Formulation: With patient Time For Goal Achievement: 07/30/18 Potential to Achieve Goals: Good Progress towards PT goals: Progressing toward goals    Frequency    7X/week      PT Plan  Current plan remains appropriate    Co-evaluation              AM-PAC PT "6 Clicks" Mobility   Outcome Measure  Help needed turning from your back to your side while in a flat bed without using bedrails?: A Little Help needed moving from lying on your back to sitting on the side of a flat bed without using bedrails?: A Little Help needed moving to and from a bed to a chair (including a wheelchair)?: A Little Help needed standing up from a chair using your arms (e.g., wheelchair or bedside  chair)?: A Little Help needed to walk in hospital room?: A Little Help needed climbing 3-5 steps with a railing? : A Little 6 Click Score: 18    End of Session Equipment Utilized During Treatment: Gait belt Activity Tolerance: Patient tolerated treatment well Patient left: in chair;with chair alarm set;with call bell/phone within reach Nurse Communication: Mobility status PT Visit Diagnosis: Other abnormalities of gait and mobility (R26.89);Difficulty in walking, not elsewhere classified (R26.2)     Time: 0821-0905 PT Time Calculation (min) (ACUTE ONLY): 44 min  Charges:  $Gait Training: 8-22 mins $Therapeutic Exercise: 8-22 mins $Therapeutic Activity: 8-22 mins                     Debe Coder PT Acute Rehabilitation Services Pager [phone] Office [phone]    [facility] 07/24/2018, 9:46 AM
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
## 2018-09-02 ENCOUNTER — Other Ambulatory Visit: Payer: Self-pay | Admitting: Internal Medicine

## 2018-09-02 DIAGNOSIS — I10 Essential (primary) hypertension: Secondary | ICD-10-CM

## 2018-10-15 DIAGNOSIS — D225 Melanocytic nevi of trunk: Secondary | ICD-10-CM | POA: Diagnosis not present

## 2018-10-15 DIAGNOSIS — L723 Sebaceous cyst: Secondary | ICD-10-CM | POA: Diagnosis not present

## 2018-10-15 DIAGNOSIS — L570 Actinic keratosis: Secondary | ICD-10-CM | POA: Diagnosis not present

## 2018-10-15 DIAGNOSIS — D2239 Melanocytic nevi of other parts of face: Secondary | ICD-10-CM | POA: Diagnosis not present

## 2018-10-15 DIAGNOSIS — Z85828 Personal history of other malignant neoplasm of skin: Secondary | ICD-10-CM | POA: Diagnosis not present

## 2018-10-15 DIAGNOSIS — L821 Other seborrheic keratosis: Secondary | ICD-10-CM | POA: Diagnosis not present

## 2018-12-02 ENCOUNTER — Other Ambulatory Visit: Payer: Self-pay

## 2018-12-02 DIAGNOSIS — Z20822 Contact with and (suspected) exposure to covid-19: Secondary | ICD-10-CM

## 2018-12-05 ENCOUNTER — Other Ambulatory Visit: Payer: Self-pay | Admitting: Internal Medicine

## 2018-12-05 DIAGNOSIS — E785 Hyperlipidemia, unspecified: Secondary | ICD-10-CM

## 2018-12-06 LAB — NOVEL CORONAVIRUS, NAA: SARS-CoV-2, NAA: NOT DETECTED

## 2018-12-14 ENCOUNTER — Other Ambulatory Visit: Payer: Self-pay

## 2019-03-10 ENCOUNTER — Other Ambulatory Visit: Payer: Self-pay | Admitting: Internal Medicine

## 2019-03-10 DIAGNOSIS — I10 Essential (primary) hypertension: Secondary | ICD-10-CM

## 2019-04-19 ENCOUNTER — Other Ambulatory Visit: Payer: Self-pay

## 2019-04-19 DIAGNOSIS — Z20822 Contact with and (suspected) exposure to covid-19: Secondary | ICD-10-CM

## 2019-04-19 DIAGNOSIS — Z20828 Contact with and (suspected) exposure to other viral communicable diseases: Secondary | ICD-10-CM | POA: Diagnosis not present

## 2019-04-20 LAB — NOVEL CORONAVIRUS, NAA: SARS-CoV-2, NAA: NOT DETECTED

## 2019-06-07 ENCOUNTER — Ambulatory Visit: Payer: Medicare Other | Attending: Internal Medicine

## 2019-06-07 DIAGNOSIS — Z23 Encounter for immunization: Secondary | ICD-10-CM | POA: Insufficient documentation

## 2019-06-07 NOTE — Progress Notes (Signed)
   Covid-19 Vaccination Clinic  Name:  Dawn [name]    MRN: [id number] DOB: [birth year]  06/07/2019  [name] was observed post Covid-19 immunization for 15 minutes without incidence. She was provided with Vaccine Information Sheet and instruction to access the V-Safe system.   [name] was instructed to call 911 with any severe reactions post vaccine: Marland Kitchen Difficulty breathing  . Swelling of your face and throat  . A fast heartbeat  . A bad rash all over your body  . Dizziness and weakness    Immunizations Administered    Name Date Dose VIS Date Route   Pfizer COVID-19 Vaccine 06/07/2019  3:53 PM 0.3 mL 04/23/2019 Intramuscular   Manufacturer: [place]   Lot: [id number]   [place]: [id number]

## 2019-06-28 ENCOUNTER — Ambulatory Visit: Payer: Medicare Other | Attending: Internal Medicine

## 2019-06-28 DIAGNOSIS — Z23 Encounter for immunization: Secondary | ICD-10-CM

## 2019-06-28 NOTE — Progress Notes (Signed)
   Covid-19 Vaccination Clinic  Name:  Dawn [name]    MRN: [id number] DOB: [birth year]  06/28/2019  Dawn [name] was observed post Covid-19 immunization for 15 minutes without incidence. She was provided with Vaccine Information Sheet and instruction to access the V-Safe system.   Dawn [name] was instructed to call 911 with any severe reactions post vaccine: Marland Kitchen Difficulty breathing  . Swelling of your face and throat  . A fast heartbeat  . A bad rash all over your body  . Dizziness and weakness    Immunizations Administered    Name Date Dose VIS Date Route   Pfizer COVID-19 Vaccine 06/28/2019 12:38 PM 0.3 mL 04/23/2019 Intramuscular   Manufacturer: [place]   Lot: [id number]   [place]: [id number]

## 2019-07-29 ENCOUNTER — Telehealth: Payer: Self-pay | Admitting: Internal Medicine

## 2019-07-29 NOTE — Progress Notes (Signed)
°  Chronic Care Management   Outreach Note  07/29/2019 Name: Dawn [name] MRN: [id number] DOB: 1950/12/17  Referred by: Janith Lima, MD Reason for referral : No chief complaint on file.   An unsuccessful telephone outreach was attempted today. The patient was referred to the pharmacist for assistance with care management and care coordination.   Follow Up Plan:   Raynicia Dukes [facility] Scheduler

## 2019-08-02 ENCOUNTER — Other Ambulatory Visit: Payer: Self-pay | Admitting: Internal Medicine

## 2019-08-02 DIAGNOSIS — I10 Essential (primary) hypertension: Secondary | ICD-10-CM

## 2019-08-03 ENCOUNTER — Telehealth: Payer: Self-pay | Admitting: Internal Medicine

## 2019-08-03 DIAGNOSIS — I10 Essential (primary) hypertension: Secondary | ICD-10-CM

## 2019-08-03 NOTE — Progress Notes (Signed)
°  Chronic Care Management   Note  08/03/2019 Name: LASHAWANDA NORBECK MRN: [id number] DOB: [birth year]  LANIE KOCHMAN is a 69 y.o. year old female who is a primary care patient of Janith Lima, MD. I reached out to [name] by phone today in response to a referral sent by Ms. Apolinar Junes Ballon's PCP, Janith Lima, MD.   Ms. Shiverdecker was given information about Chronic Care Management services today including:  1. CCM service includes personalized support from designated clinical staff supervised by her physician, including individualized plan of care and coordination with other care providers 2. 24/7 contact phone numbers for assistance for urgent and routine care needs. 3. Service will only be billed when office clinical staff spend 20 minutes or more in a month to coordinate care. 4. Only one practitioner may furnish and bill the service in a calendar month. 5. The patient may stop CCM services at any time (effective at the end of the month) by phone call to the office staff.   Patient agreed to services and verbal consent obtained.   Follow up plan:   Raynicia Dukes [facility] Scheduler

## 2019-08-06 DIAGNOSIS — L570 Actinic keratosis: Secondary | ICD-10-CM | POA: Diagnosis not present

## 2019-08-06 DIAGNOSIS — D485 Neoplasm of uncertain behavior of skin: Secondary | ICD-10-CM | POA: Diagnosis not present

## 2019-08-06 DIAGNOSIS — D0472 Carcinoma in situ of skin of left lower limb, including hip: Secondary | ICD-10-CM | POA: Diagnosis not present

## 2019-08-06 DIAGNOSIS — D0471 Carcinoma in situ of skin of right lower limb, including hip: Secondary | ICD-10-CM | POA: Diagnosis not present

## 2019-08-06 DIAGNOSIS — Z85828 Personal history of other malignant neoplasm of skin: Secondary | ICD-10-CM | POA: Diagnosis not present

## 2019-08-23 NOTE — Addendum Note (Signed)
Addended by: Aviva Signs M on: 08/23/2019 10:19 AM   Modules accepted: Orders

## 2019-08-23 NOTE — Chronic Care Management (AMB) (Deleted)
   Chronic Care Management Pharmacy  Name: Dawn [name]  MRN: [id number] DOB: 01-22-1951   Chief Complaint/ HPI  Dawn [name],  69 y.o. , female presents for their Initial CCM visit with the clinical pharmacist via telephone due to COVID-19 Pandemic.  PCP : Janith Lima, MD  Their chronic conditions include: HTN, HLD, allergies  Office Visits:***  Consult Visit:***  Medications: Outpatient Encounter Medications as of 08/24/2019  Medication Sig  . celecoxib (CELEBREX) 200 MG capsule Take 1 capsule (200 mg total) by mouth 2 (two) times daily.  . ferrous sulfate 325 (65 FE) MG tablet Take 1 tablet (325 mg total) by mouth 2 (two) times daily with a meal.  . HYDROcodone-acetaminophen (NORCO) 7.5-325 MG tablet Take 1-2 tablets by mouth every 4 (four) hours as needed for severe pain (pain score 7-10).  . methocarbamol (ROBAXIN) 500 MG tablet Take 1 tablet (500 mg total) by mouth every 6 (six) hours as needed for muscle spasms.  Marland Kitchen olmesartan-hydrochlorothiazide (BENICAR HCT) 20-12.5 MG tablet TAKE 1 TABLET BY MOUTH EVERY DAY  . rosuvastatin (CRESTOR) 5 MG tablet TAKE 1 TABLET BY MOUTH EVERY DAY  . triamcinolone (NASACORT ALLERGY 24HR) 55 MCG/ACT AERO nasal inhaler Place 2 sprays into the nose daily as needed (allergies).   No facility-administered encounter medications on file as of 08/24/2019.     Current Diagnosis/Assessment:  Goals Addressed   None     {CHL [facility] Diagnosis/Assessment:[phone]}

## 2019-08-24 ENCOUNTER — Telehealth: Payer: Medicare Other

## 2019-08-26 ENCOUNTER — Other Ambulatory Visit: Payer: Self-pay

## 2019-08-26 ENCOUNTER — Encounter: Payer: Self-pay | Admitting: Internal Medicine

## 2019-08-26 ENCOUNTER — Ambulatory Visit (INDEPENDENT_AMBULATORY_CARE_PROVIDER_SITE_OTHER): Payer: Medicare Other | Admitting: Internal Medicine

## 2019-08-26 VITALS — BP 148/88 | HR 73 | Temp 98.6°F | Resp 16 | Ht 68.0 in | Wt 214.0 lb

## 2019-08-26 DIAGNOSIS — E785 Hyperlipidemia, unspecified: Secondary | ICD-10-CM | POA: Diagnosis not present

## 2019-08-26 DIAGNOSIS — R7989 Other specified abnormal findings of blood chemistry: Secondary | ICD-10-CM | POA: Diagnosis not present

## 2019-08-26 DIAGNOSIS — I10 Essential (primary) hypertension: Secondary | ICD-10-CM | POA: Diagnosis not present

## 2019-08-26 MED ORDER — IRBESARTAN 150 MG PO TABS: 150.0000 mg | ORAL_TABLET | Freq: Every day | ORAL | 1 refills | Status: DC

## 2019-08-26 MED ORDER — ATORVASTATIN CALCIUM 20 MG PO TABS: 20.0000 mg | ORAL_TABLET | Freq: Every day | ORAL | 1 refills | Status: DC

## 2019-08-26 NOTE — Progress Notes (Signed)
Subjective:  Patient ID: Dawn [name], female    DOB: Dec 13, 1950  Age: 69 y.o. MRN: [id number]  CC: Hypertension and Hyperlipidemia  This visit occurred during the SARS-CoV-2 public health emergency.  Safety protocols were in place, including screening questions prior to the visit, additional usage of staff PPE, and extensive cleaning of exam room while observing appropriate contact time as indicated for disinfecting solutions.    HPI Dawn [name] presents for f/up - She is active and denies any recent episodes of chest pain, shortness of breath, dyspnea on exertion, edema, or fatigue.  She is not taking rosuvastatin because she felt it was too expensive.  She is not taking an antihypertensive because she ran out.  She has intentionally lost weight with lifestyle modifications.  Outpatient Medications Prior to Visit  Medication Sig Dispense Refill  . mupirocin ointment (BACTROBAN) 2 % APPLY TO AFFECTED AREA EVERY DAY    . olmesartan-hydrochlorothiazide (BENICAR HCT) 20-12.5 MG tablet TAKE 1 TABLET BY MOUTH EVERY DAY 90 tablet 1  . rosuvastatin (CRESTOR) 5 MG tablet TAKE 1 TABLET BY MOUTH EVERY DAY 90 tablet 1  . celecoxib (CELEBREX) 200 MG capsule Take 1 capsule (200 mg total) by mouth 2 (two) times daily. 60 capsule 0  . ferrous sulfate 325 (65 FE) MG tablet Take 1 tablet (325 mg total) by mouth 2 (two) times daily with a meal. 28 tablet 3  . HYDROcodone-acetaminophen (NORCO) 7.5-325 MG tablet Take 1-2 tablets by mouth every 4 (four) hours as needed for severe pain (pain score 7-10). 60 tablet 0  . methocarbamol (ROBAXIN) 500 MG tablet Take 1 tablet (500 mg total) by mouth every 6 (six) hours as needed for muscle spasms. 40 tablet 0  . triamcinolone (NASACORT ALLERGY 24HR) 55 MCG/ACT AERO nasal inhaler Place 2 sprays into the nose daily as needed (allergies).     No facility-administered medications prior to visit.    ROS Review of Systems  Constitutional: Negative for  diaphoresis, fatigue and unexpected weight change.  HENT: Negative.   Eyes: Negative for visual disturbance.  Respiratory: Negative for cough, chest tightness, shortness of breath and wheezing.   Cardiovascular: Negative for chest pain, palpitations and leg swelling.  Gastrointestinal: Negative for abdominal pain, constipation, diarrhea and vomiting.  Genitourinary: Negative.  Negative for difficulty urinating.  Musculoskeletal: Negative for arthralgias, joint swelling and myalgias.  Skin: Negative.  Negative for color change.  Neurological: Negative.  Negative for dizziness, weakness and headaches.  Hematological: Negative for adenopathy. Does not bruise/bleed easily.  Psychiatric/Behavioral: Negative.     Objective:  BP (!) 148/88 (BP Location: Left Arm, Patient Position: Sitting, Cuff Size: Large)   Pulse 73   Temp 98.6 F (37 C) (Oral)   Resp 16   Ht 5\' 8"  (1.727 m)   Wt 214 lb (97.1 kg)   SpO2 98%   BMI 32.54 kg/m   BP Readings from Last 3 Encounters:  08/26/19 (!) 148/88  07/24/18 121/75  07/17/18 138/75    Wt Readings from Last 3 Encounters:  08/26/19 214 lb (97.1 kg)  07/23/18 218 lb 1.6 oz (98.9 kg)  07/17/18 218 lb 1.6 oz (98.9 kg)    Physical Exam Vitals reviewed.  Constitutional:      Appearance: She is obese.  HENT:     Nose: Nose normal.     Mouth/Throat:     Mouth: Mucous membranes are moist.  Eyes:     General: No scleral icterus.    Conjunctiva/sclera: Conjunctivae normal.  Cardiovascular:     Rate and Rhythm: Normal rate and regular rhythm.     Heart sounds: No murmur.  Pulmonary:     Effort: Pulmonary effort is normal.     Breath sounds: No wheezing, rhonchi or rales.  Abdominal:     General: Abdomen is protuberant. Bowel sounds are normal. There is no distension.     Palpations: There is no hepatomegaly, splenomegaly or mass.     Tenderness: There is no abdominal tenderness.  Musculoskeletal:        General: Normal range of motion.      Cervical back: Neck supple.     Right lower leg: No edema.     Left lower leg: No edema.  Lymphadenopathy:     Cervical: No cervical adenopathy.  Skin:    General: Skin is warm and dry.     Coloration: Skin is not pale.  Neurological:     General: No focal deficit present.     Mental Status: She is alert.  Psychiatric:        Mood and Affect: Mood normal.        Behavior: Behavior normal.     Lab Results  Component Value Date   WBC 5.5 08/26/2019   HGB 14.1 08/26/2019   HCT 40.7 08/26/2019   PLT 284.0 08/26/2019   GLUCOSE 89 08/26/2019   CHOL 212 (H) 08/26/2019   TRIG 224.0 (H) 08/26/2019   HDL 69.90 08/26/2019   LDLDIRECT 112.0 08/26/2019   LDLCALC 147 (H) 06/11/2018   ALT 35 08/26/2019   AST 29 08/26/2019   NA 141 08/26/2019   K 4.8 08/26/2019   CL 105 08/26/2019   CREATININE 0.77 08/26/2019   BUN 28 (H) 08/26/2019   CO2 27 08/26/2019   TSH 1.59 08/26/2019   INR 0.9 06/11/2018   HGBA1C 5.0 06/11/2018    No results found.  Assessment & Plan:   [name] was seen today for hypertension and hyperlipidemia.  Diagnoses and all orders for this visit:  Essential hypertension- Her blood pressure is not adequately well controlled.  Labs are negative for secondary causes or endorgan damage.  I have asked her to start taking an ARB. -     Basic metabolic panel; Future -     TSH; Future -     CBC with Differential/Platelet; Future -     irbesartan (AVAPRO) 150 MG tablet; Take 1 tablet (150 mg total) by mouth daily. -     CBC with Differential/Platelet -     TSH -     Basic metabolic panel  Elevated LFTs- Her liver enzymes are better but still very mildly elevated.  I have asked her to limit her alcohol intake to 1-2 servings a day. -     Hepatic function panel; Future -     Hepatic function panel  Hyperlipidemia LDL goal <130- She has a mildly elevated ASCVD risk score.  I have asked her to take a statin for CV risk reduction. -     Lipid panel; Future -      Hepatic function panel; Future -     TSH; Future -     atorvastatin (LIPITOR) 20 MG tablet; Take 1 tablet (20 mg total) by mouth daily. -     TSH -     Hepatic function panel -     Lipid panel  Other orders -     LDL cholesterol, direct   I have discontinued Dawn [name] "Carol"'s triamcinolone, celecoxib,  HYDROcodone-acetaminophen, ferrous sulfate, methocarbamol, olmesartan-hydrochlorothiazide, and rosuvastatin. I am also having her start on irbesartan and atorvastatin. Additionally, I am having her maintain her mupirocin ointment.  Meds ordered this encounter  Medications  . irbesartan (AVAPRO) 150 MG tablet    Sig: Take 1 tablet (150 mg total) by mouth daily.    Dispense:  90 tablet    Refill:  1  . atorvastatin (LIPITOR) 20 MG tablet    Sig: Take 1 tablet (20 mg total) by mouth daily.    Dispense:  90 tablet    Refill:  1     Follow-up: Return in about 6 months (around 02/25/2020).  Scarlette Calico, MD

## 2019-08-26 NOTE — Patient Instructions (Signed)

## 2019-08-27 ENCOUNTER — Encounter: Payer: Self-pay | Admitting: Internal Medicine

## 2019-08-27 LAB — CBC WITH DIFFERENTIAL/PLATELET
Basophils Absolute: 0.1 10*3/uL (ref 0.0–0.1)
Basophils Relative: 1.8 % (ref 0.0–3.0)
Eosinophils Absolute: 0.1 10*3/uL (ref 0.0–0.7)
Eosinophils Relative: 2.1 % (ref 0.0–5.0)
HCT: 40.7 % (ref 36.0–46.0)
Hemoglobin: 14.1 g/dL (ref 12.0–15.0)
Lymphocytes Relative: 42.2 % (ref 12.0–46.0)
Lymphs Abs: 2.3 10*3/uL (ref 0.7–4.0)
MCHC: 34.7 g/dL (ref 30.0–36.0)
MCV: 95.7 fl (ref 78.0–100.0)
Monocytes Absolute: 0.6 10*3/uL (ref 0.1–1.0)
Monocytes Relative: 10.2 % (ref 3.0–12.0)
Neutro Abs: 2.4 10*3/uL (ref 1.4–7.7)
Neutrophils Relative %: 43.7 % (ref 43.0–77.0)
Platelets: 284 10*3/uL (ref 150.0–400.0)
RBC: 4.25 Mil/uL (ref 3.87–5.11)
RDW: 13 % (ref 11.5–15.5)
WBC: 5.5 10*3/uL (ref 4.0–10.5)

## 2019-08-27 LAB — BASIC METABOLIC PANEL
BUN: 28 mg/dL — ABNORMAL HIGH (ref 6–23)
CO2: 27 mEq/L (ref 19–32)
Calcium: 9.7 mg/dL (ref 8.4–10.5)
Chloride: 105 mEq/L (ref 96–112)
Creatinine, Ser: 0.77 mg/dL (ref 0.40–1.20)
GFR: 74.43 mL/min (ref >60.00)
Glucose, Bld: 89 mg/dL (ref 70–99)
Potassium: 4.8 mEq/L (ref 3.5–5.1)
Sodium: 141 mEq/L (ref 135–145)

## 2019-08-27 LAB — HEPATIC FUNCTION PANEL
ALT: 35 U/L (ref 0–35)
AST: 29 U/L (ref 0–37)
Albumin: 4.4 g/dL (ref 3.5–5.2)
Alkaline Phosphatase: 75 U/L (ref 39–117)
Bilirubin, Direct: 0.1 mg/dL (ref 0.0–0.3)
Total Bilirubin: 0.5 mg/dL (ref 0.2–1.2)
Total Protein: 7.1 g/dL (ref 6.0–8.3)

## 2019-08-27 LAB — LDL CHOLESTEROL, DIRECT: Direct LDL: 112 mg/dL

## 2019-08-27 LAB — LIPID PANEL
Cholesterol: 212 mg/dL — ABNORMAL HIGH (ref 0–200)
HDL: 69.9 mg/dL (ref >39.00)
NonHDL: 142.49
Total CHOL/HDL Ratio: 3
Triglycerides: 224 mg/dL — ABNORMAL HIGH (ref 0.0–149.0)
VLDL: 44.8 mg/dL — ABNORMAL HIGH (ref 0.0–40.0)

## 2019-08-27 LAB — TSH: TSH: 1.59 u[IU]/mL (ref 0.35–4.50)

## 2019-09-22 ENCOUNTER — Telehealth: Payer: Self-pay | Admitting: Internal Medicine

## 2019-09-22 NOTE — Chronic Care Management (AMB) (Signed)
  Chronic Care Management   Outreach Note  09/22/2019 Name: Dawn [name] MRN: [id number] DOB: 1950-05-28  Referred by: Janith Lima, MD Reason for referral : Chronic Care Management   An unsuccessful telephone outreach was attempted today. The patient was referred to the pharmacist for assistance with care management and care coordination.   Follow Up Plan:   [place]

## 2019-09-23 ENCOUNTER — Telehealth: Payer: Self-pay | Admitting: Internal Medicine

## 2019-09-23 NOTE — Progress Notes (Signed)
  Chronic Care Management   Outreach Note  09/23/2019 Name: Dawn [name] MRN: [id number] DOB: 11/18/1950  Referred by: Janith Lima, MD Reason for referral : No chief complaint on file.   An unsuccessful telephone outreach was attempted today. The patient was referred to the pharmacist for assistance with care management and care coordination.   This note is not being shared with the patient for the following reason: To respect privacy (The patient or proxy has requested that the information not be shared).  Follow Up Plan:   [facility]

## 2019-10-01 ENCOUNTER — Telehealth: Payer: Self-pay | Admitting: Internal Medicine

## 2019-10-01 DIAGNOSIS — I10 Essential (primary) hypertension: Secondary | ICD-10-CM

## 2019-10-01 NOTE — Progress Notes (Signed)
  Chronic Care Management   Note  10/01/2019 Name: Dawn [name] MRN: [id number] DOB: Oct 13, 1950  Dawn [name] is a 69 y.o. year old female who is a primary care patient of Janith Lima, MD. I reached out to [name] by phone today in response to a referral sent by Ms. Apolinar Junes Reiley's PCP, Janith Lima, MD.   [name] was given information about Chronic Care Management services today including:  1. CCM service includes personalized support from designated clinical staff supervised by her physician, including individualized plan of care and coordination with other care providers 2. 24/7 contact phone numbers for assistance for urgent and routine care needs. 3. Service will only be billed when office clinical staff spend 20 minutes or more in a month to coordinate care. 4. Only one practitioner may furnish and bill the service in a calendar month. 5. The patient may stop CCM services at any time (effective at the end of the month) by phone call to the office staff.   Patient agreed to services and verbal consent obtained.   This note is not being shared with the patient for the following reason: To respect privacy (The patient or proxy has requested that the information not be shared).  Follow up plan:   [facility]

## 2019-11-30 NOTE — Addendum Note (Signed)
Addended by: Aviva Signs M on: 11/30/2019 11:33 AM   Modules accepted: Orders

## 2019-12-01 DIAGNOSIS — Z96651 Presence of right artificial knee joint: Secondary | ICD-10-CM | POA: Diagnosis not present

## 2019-12-01 DIAGNOSIS — Z471 Aftercare following joint replacement surgery: Secondary | ICD-10-CM | POA: Diagnosis not present

## 2019-12-02 ENCOUNTER — Other Ambulatory Visit: Payer: Self-pay

## 2019-12-02 ENCOUNTER — Ambulatory Visit: Payer: Medicare Other | Admitting: Pharmacist

## 2019-12-02 DIAGNOSIS — I10 Essential (primary) hypertension: Secondary | ICD-10-CM

## 2019-12-02 DIAGNOSIS — Z1231 Encounter for screening mammogram for malignant neoplasm of breast: Secondary | ICD-10-CM | POA: Diagnosis not present

## 2019-12-02 DIAGNOSIS — E785 Hyperlipidemia, unspecified: Secondary | ICD-10-CM

## 2019-12-02 NOTE — Patient Instructions (Addendum)
Visit Information  Phone number for Pharmacist: [phone]  Goals Addressed            This Visit's Progress   . Pharmacy Care Plan       CARE PLAN ENTRY (see longitudinal plan of care for additional care plan information)  Current Barriers:  . Chronic Disease Management support, education, and care coordination needs related to Hypertension and Hyperlipidemia   Hypertension BP Readings from Last 3 Encounters:  08/26/19 (!) 148/88  07/24/18 121/75  07/17/18 138/75 .  Pharmacist Clinical Goal(s): o Over the next 180 days, patient will work with PharmD and providers to achieve BP goal <130/80 . Current regimen:  o Irbesartan 150 mg daily . Interventions: o Discussed BP goals and benefits of medication for prevention of heart attack / stroke . Patient self care activities - Over the next 180 days, patient will: o Check BP 2-3 times weekly, document, and provide at future appointments o Ensure daily salt intake < 2300 mg/day  Hyperlipidemia Lab Results  Component Value Date/Time   LDLCALC 147 (H) 06/11/2018 10:16 AM   LDLDIRECT 112.0 08/26/2019 04:21 PM .  Pharmacist Clinical Goal(s): o Over the next 180 days, patient will work with PharmD and providers to achieve LDL goal < 100 . Current regimen:  o Atorvastatin 20 mg daily . Interventions: o Discussed cholesterol goals and benefits of medication for prevention of heart attack / stroke . Patient self care activities - Over the next 180 days, patient will: o Continue medication as prescribed  Medication management . Pharmacist Clinical Goal(s): o Over the next 180 days, patient will work with PharmD and providers to maintain optimal medication adherence . Current pharmacy: [facility] . Interventions o Comprehensive medication review performed. o Continue current medication management strategy o Discussed Good Rx coupons for cost savings - similar to insurance prices . Patient self care activities - Over the next 180 days,  patient will: o Focus on medication adherence by fill date o Take medications as prescribed o Report any questions or concerns to PharmD and/or provider(s)  Initial goal documentation      Patient verbalizes understanding of instructions provided today.   Telephone follow up appointment with pharmacy team member scheduled for: 6 months  Charlene Brooke, PharmD Clinical Pharmacist [facility] [phone]  Heart-Healthy Eating Plan Many factors influence your heart (coronary) health, including eating and exercise habits. Coronary risk increases with abnormal blood fat (lipid) levels. Heart-healthy meal planning includes limiting unhealthy fats, increasing healthy fats, and making other diet and lifestyle changes. What are tips for following this plan? Cooking Cook foods using methods other than frying. Baking, boiling, grilling, and broiling are all good options. Other ways to reduce fat include:  Removing the skin from poultry.  Removing all visible fats from meats.  Steaming vegetables in water or broth. Meal planning   At meals, imagine dividing your plate into fourths: ? Fill one-half of your plate with vegetables and green salads. ? Fill one-fourth of your plate with whole grains. ? Fill one-fourth of your plate with lean protein foods.  Eat 4-5 servings of vegetables per day. One serving equals 1 cup raw or cooked vegetable, or 2 cups raw leafy greens.  Eat 4-5 servings of fruit per day. One serving equals 1 medium whole fruit,  cup dried fruit,  cup fresh, frozen, or canned fruit, or  cup 100% fruit juice.  Eat more foods that contain soluble fiber. Examples include apples, broccoli, carrots, beans, peas,  and barley. Aim to get 25-30 g of fiber per day.  Increase your consumption of legumes, nuts, and seeds to 4-5 servings per week. One serving of dried beans or legumes equals  cup cooked, 1 serving of nuts is  cup, and 1 serving of  seeds equals 1 tablespoon. Fats  Choose healthy fats more often. Choose monounsaturated and polyunsaturated fats, such as olive and canola oils, flaxseeds, walnuts, almonds, and seeds.  Eat more omega-3 fats. Choose salmon, mackerel, sardines, tuna, flaxseed oil, and ground flaxseeds. Aim to eat fish at least 2 times each week.  Check food labels carefully to identify foods with trans fats or high amounts of saturated fat.  Limit saturated fats. These are found in animal products, such as meats, butter, and cream. Plant sources of saturated fats include palm oil, palm kernel oil, and coconut oil.  Avoid foods with partially hydrogenated oils in them. These contain trans fats. Examples are stick margarine, some tub margarines, cookies, crackers, and other baked goods.  Avoid fried foods. General information  Eat more home-cooked food and less restaurant, buffet, and fast food.  Limit or avoid alcohol.  Limit foods that are high in starch and sugar.  Lose weight if you are overweight. Losing just 5-10% of your body weight can help your overall health and prevent diseases such as diabetes and heart disease.  Monitor your salt (sodium) intake, especially if you have high blood pressure. Talk with your health care provider about your sodium intake.  Try to incorporate more vegetarian meals weekly. What foods can I eat? Fruits All fresh, canned (in natural juice), or frozen fruits. Vegetables Fresh or frozen vegetables (raw, steamed, roasted, or grilled). Green salads. Grains Most grains. Choose whole wheat and whole grains most of the time. Rice and pasta, including brown rice and pastas made with whole wheat. Meats and other proteins Lean, well-trimmed beef, veal, pork, and lamb. Chicken and [place] without skin. All fish and shellfish. Wild duck, rabbit, pheasant, and venison. Egg whites or low-cholesterol egg substitutes. Dried beans, peas, lentils, and tofu. Seeds and most  nuts. Dairy Low-fat or nonfat cheeses, including ricotta and mozzarella. Skim or 1% milk (liquid, powdered, or evaporated). Buttermilk made with low-fat milk. Nonfat or low-fat yogurt. Fats and oils Non-hydrogenated (trans-free) margarines. Vegetable oils, including soybean, sesame, sunflower, olive, peanut, safflower, corn, canola, and cottonseed. Salad dressings or mayonnaise made with a vegetable oil. Beverages Water (mineral or sparkling). Coffee and tea. Diet carbonated beverages. Sweets and desserts Sherbet, gelatin, and fruit ice. Small amounts of dark chocolate. Limit all sweets and desserts. Seasonings and condiments All seasonings and condiments. The items listed above may not be a complete list of foods and beverages you can eat. Contact a dietitian for more options. What foods are not recommended? Fruits Canned fruit in heavy syrup. Fruit in cream or butter sauce. Fried fruit. Limit coconut. Vegetables Vegetables cooked in cheese, cream, or butter sauce. Fried vegetables. Grains Breads made with saturated or trans fats, oils, or whole milk. Croissants. Sweet rolls. Donuts. High-fat crackers, such as cheese crackers. Meats and other proteins Fatty meats, such as hot dogs, ribs, sausage, bacon, rib-eye roast or steak. High-fat deli meats, such as salami and bologna. Caviar. Domestic duck and goose. Organ meats, such as liver. Dairy Cream, sour cream, cream cheese, and creamed cottage cheese. Whole milk cheeses. Whole or 2% milk (liquid, evaporated, or condensed). Whole buttermilk. Cream sauce or high-fat cheese sauce. Whole-milk yogurt. Fats and oils Meat fat, or shortening.  Cocoa butter, hydrogenated oils, palm oil, coconut oil, palm kernel oil. Solid fats and shortenings, including bacon fat, salt pork, lard, and butter. Nondairy cream substitutes. Salad dressings with cheese or sour cream. Beverages Regular sodas and any drinks with added sugar. Sweets and desserts Frosting.  Pudding. Cookies. Cakes. Pies. Milk chocolate or white chocolate. Buttered syrups. Full-fat ice cream or ice cream drinks. The items listed above may not be a complete list of foods and beverages to avoid. Contact a dietitian for more information. Summary  Heart-healthy meal planning includes limiting unhealthy fats, increasing healthy fats, and making other diet and lifestyle changes.  Lose weight if you are overweight. Losing just 5-10% of your body weight can help your overall health and prevent diseases such as diabetes and heart disease.  Focus on eating a balance of foods, including fruits and vegetables, low-fat or nonfat dairy, lean protein, nuts and legumes, whole grains, and heart-healthy oils and fats. This information is not intended to replace advice given to you by your health care provider. Make sure you discuss any questions you have with your health care provider. Document Revised: 06/06/2017 Document Reviewed: 06/06/2017 Elsevier Patient Education  2020 [employer].

## 2019-12-02 NOTE — Chronic Care Management (AMB) (Signed)
Chronic Care Management Pharmacy  Name: Dawn [name]  MRN: [id number] DOB: Feb 09, 1951   Chief Complaint/ HPI  Dawn [name],  69 y.o. , female presents for their Initial CCM visit with the clinical pharmacist via telephone due to COVID-19 Pandemic.  PCP : Janith Lima, MD Patient Care Team: Janith Lima, MD as PCP - General (Internal Medicine) Charlton Haws, [facility] as Pharmacist (Pharmacist)  Their chronic conditions include: Hypertension, Hyperlipidemia and Allergic rhinitis   Worked for [employer] for 30 years, furloughed and ultimately laid off last year during pandemic, she is still looking for work, not ready to be retired. She has been married since 1979, 2 children and 3 grandchildren all in [place].   Office Visits: 08/26/19 Dr Ronnald Ramp OV: Pt was not taking rosuvastatin due to cost; was not taking BP med due to running out. Started irbesartan 150 mg due to uncontrolled BP (148/88). Started atorvastatin 20 mg for ASCVD risk reduction.  Consult Visit: N/A  Allergies  Allergen Reactions  . Amlodipine Swelling  . Iohexol Swelling    Allergy is to [employer];  oysters caused swelling of anterior neck. Dr.Clinton Young could find no "markers" for shellfish allergy///NO IV CONTRAST ALLERGY//A.CALHOUN///  . Shellfish Allergy Swelling    Medications: Outpatient Encounter Medications as of 12/02/2019  Medication Sig  . atorvastatin (LIPITOR) 20 MG tablet Take 1 tablet (20 mg total) by mouth daily.  . irbesartan (AVAPRO) 150 MG tablet Take 1 tablet (150 mg total) by mouth daily.  . mupirocin ointment (BACTROBAN) 2 % APPLY TO AFFECTED AREA EVERY DAY   No facility-administered encounter medications on file as of 12/02/2019.     Current Diagnosis/Assessment:  SDOH Interventions     Most Recent Value  SDOH Interventions  Financial Strain Interventions Intervention Not Indicated      Goals Addressed            This Visit's Progress   . Pharmacy Care  Plan       CARE PLAN ENTRY (see longitudinal plan of care for additional care plan information)  Current Barriers:  . Chronic Disease Management support, education, and care coordination needs related to Hypertension and Hyperlipidemia   Hypertension BP Readings from Last 3 Encounters:  08/26/19 (!) 148/88  07/24/18 121/75  07/17/18 138/75 .  Pharmacist Clinical Goal(s): o Over the next 180 days, patient will work with PharmD and providers to achieve BP goal <130/80 . Current regimen:  o Irbesartan 150 mg daily . Interventions: o Discussed BP goals and benefits of medication for prevention of heart attack / stroke . Patient self care activities - Over the next 180 days, patient will: o Check BP 2-3 times weekly, document, and provide at future appointments o Ensure daily salt intake < 2300 mg/day  Hyperlipidemia Lab Results  Component Value Date/Time   LDLCALC 147 (H) 06/11/2018 10:16 AM   LDLDIRECT 112.0 08/26/2019 04:21 PM .  Pharmacist Clinical Goal(s): o Over the next 180 days, patient will work with PharmD and providers to achieve LDL goal < 100 . Current regimen:  o Atorvastatin 20 mg daily . Interventions: o Discussed cholesterol goals and benefits of medication for prevention of heart attack / stroke . Patient self care activities - Over the next 180 days, patient will: o Continue medication as prescribed  Medication management . Pharmacist Clinical Goal(s): o Over the next 180 days, patient will work with PharmD and providers to maintain optimal medication adherence . [facility] . Interventions  o Comprehensive medication review performed. o Continue current medication management strategy o Discussed Good Rx coupons for cost savings - similar to insurance prices . Patient self care activities - Over the next 180 days, patient will: o Focus on medication adherence by fill date o Take medications as prescribed o Report any questions or concerns to  PharmD and/or provider(s)  Initial goal documentation       Hypertension   BP goal is:  <130/80  Office blood pressures are  BP Readings from Last 3 Encounters:  08/26/19 (!) 148/88  07/24/18 121/75  07/17/18 138/75   Patient checks BP at home: does not check Patient home BP readings are ranging: n/a  Patient has failed these meds in the past: olmesartan-HCTZ Patient is currently uncontrolled on the following medications:  . Irbesartan 150 mg daily  We discussed diet and exercise extensively - pool 4 days a week for 60-90 min, tries to limit salt in diet. Discussed BP goal and importance of medication adherence. Discussed benefit of monitoring BP at home to establish baseline/average BP  Plan  Continue current medications and control with diet and exercise  Monitor BP at home    Hyperlipidemia   LDL goal < 100  Lipid Panel     Component Value Date/Time   CHOL 212 (H) 08/26/2019 1621   TRIG 224.0 (H) 08/26/2019 1621   HDL 69.90 08/26/2019 1621   LDLCALC 147 (H) 06/11/2018 1016   LDLDIRECT 112.0 08/26/2019 1621    Hepatic Function Latest Ref Rng & Units 08/26/2019 06/11/2018 03/17/2017  Total Protein 6.0 - 8.3 g/dL 7.1 6.9 7.4  Albumin 3.5 - 5.2 g/dL 4.4 4.3 4.4  AST 0 - 37 U/L 29 37 22  ALT 0 - 35 U/L 35 47(H) 31  Alk Phosphatase 39 - 117 U/L 75 92 78  Total Bilirubin 0.2 - 1.2 mg/dL 0.5 0.6 0.5  Bilirubin, Direct 0.0 - 0.3 mg/dL 0.1 - -    The 10-year ASCVD risk score Mikey Bussing DC Jr., et al., 2013) is: 12.9%   Values used to calculate the score:     Age: 19 years     Sex: Female     Is Non-Hispanic African American: No     Diabetic: No     Tobacco smoker: No     Systolic Blood Pressure: 254 mmHg     Is BP treated: Yes     HDL Cholesterol: 69.9 mg/dL     Total Cholesterol: 212 mg/dL   Patient has failed these meds in past: rosuvastatin 5 mg Patient is currently uncontrolled on the following medications:  . Atorvastatin 20 mg daily  We discussed:  diet  and exercise extensively; cholesterol goals; benefit of statin for ASCVD risk reduction  Plan  Continue current medications and control with diet and exercise  Medication Management   [facility] Uses pill box? No - prefers bottles Pt endorses 100% compliance  We discussed: [facility] with [facility]; pt appreciates ability to order refills online. Pt gets medications in 30 day increments for cost savings. She reports medications are ~$20/month, I questioned the accuracy of this since generic copays are supposed to be $0-5 with her plan. Pt plans to check prices next time she refills meds.  Plan  Continue current medication management strategy    Follow up: 6 month phone visit  Charlene Brooke, PharmD Clinical Pharmacist [facility] [phone]

## 2019-12-15 DIAGNOSIS — L918 Other hypertrophic disorders of the skin: Secondary | ICD-10-CM | POA: Diagnosis not present

## 2019-12-15 DIAGNOSIS — L821 Other seborrheic keratosis: Secondary | ICD-10-CM | POA: Diagnosis not present

## 2019-12-15 DIAGNOSIS — D1801 Hemangioma of skin and subcutaneous tissue: Secondary | ICD-10-CM | POA: Diagnosis not present

## 2019-12-15 DIAGNOSIS — L814 Other melanin hyperpigmentation: Secondary | ICD-10-CM | POA: Diagnosis not present

## 2019-12-15 DIAGNOSIS — Z85828 Personal history of other malignant neoplasm of skin: Secondary | ICD-10-CM | POA: Diagnosis not present

## 2019-12-15 DIAGNOSIS — L570 Actinic keratosis: Secondary | ICD-10-CM | POA: Diagnosis not present

## 2019-12-15 DIAGNOSIS — L819 Disorder of pigmentation, unspecified: Secondary | ICD-10-CM | POA: Diagnosis not present

## 2020-01-02 ENCOUNTER — Encounter: Payer: Self-pay | Admitting: Internal Medicine

## 2020-01-06 DIAGNOSIS — L821 Other seborrheic keratosis: Secondary | ICD-10-CM | POA: Diagnosis not present

## 2020-01-06 DIAGNOSIS — Z85828 Personal history of other malignant neoplasm of skin: Secondary | ICD-10-CM | POA: Diagnosis not present

## 2020-01-06 DIAGNOSIS — D0439 Carcinoma in situ of skin of other parts of face: Secondary | ICD-10-CM | POA: Diagnosis not present

## 2020-01-06 DIAGNOSIS — C44329 Squamous cell carcinoma of skin of other parts of face: Secondary | ICD-10-CM | POA: Diagnosis not present

## 2020-01-13 DIAGNOSIS — Z4802 Encounter for removal of sutures: Secondary | ICD-10-CM | POA: Diagnosis not present

## 2020-02-07 ENCOUNTER — Other Ambulatory Visit: Payer: Self-pay | Admitting: Internal Medicine

## 2020-02-07 ENCOUNTER — Encounter: Payer: Self-pay | Admitting: Internal Medicine

## 2020-02-07 DIAGNOSIS — I10 Essential (primary) hypertension: Secondary | ICD-10-CM

## 2020-02-07 DIAGNOSIS — E785 Hyperlipidemia, unspecified: Secondary | ICD-10-CM

## 2020-02-07 MED ORDER — LOSARTAN POTASSIUM 100 MG PO TABS: 100.0000 mg | ORAL_TABLET | Freq: Every day | ORAL | 1 refills | Status: DC

## 2020-02-07 MED ORDER — SIMVASTATIN 20 MG PO TABS: 20.0000 mg | ORAL_TABLET | Freq: Every day | ORAL | 1 refills | Status: DC

## 2020-02-17 ENCOUNTER — Ambulatory Visit: Payer: Medicare Other | Attending: Internal Medicine

## 2020-02-17 DIAGNOSIS — Z23 Encounter for immunization: Secondary | ICD-10-CM

## 2020-02-17 NOTE — Progress Notes (Signed)
   Covid-19 Vaccination Clinic  Name:  Dawn [name]    MRN: [id number] DOB: 07/14/1950  02/17/2020  Ms. Dawn [name] was observed post Covid-19 immunization for 15 minutes without incident. She was provided with Vaccine Information Sheet and instruction to access the V-Safe system.   Ms. Dawn [name] was instructed to call 911 with any severe reactions post vaccine: Marland Kitchen Difficulty breathing  . Swelling of face and throat  . A fast heartbeat  . A bad rash all over body  . Dizziness and weakness

## 2020-05-30 ENCOUNTER — Telehealth: Payer: Medicare Other

## 2020-07-19 DIAGNOSIS — L570 Actinic keratosis: Secondary | ICD-10-CM | POA: Diagnosis not present

## 2020-07-19 DIAGNOSIS — D480 Neoplasm of uncertain behavior of bone and articular cartilage: Secondary | ICD-10-CM | POA: Diagnosis not present

## 2020-07-19 DIAGNOSIS — Z85828 Personal history of other malignant neoplasm of skin: Secondary | ICD-10-CM | POA: Diagnosis not present

## 2020-07-25 ENCOUNTER — Ambulatory Visit (INDEPENDENT_AMBULATORY_CARE_PROVIDER_SITE_OTHER): Payer: Medicare Other | Admitting: Internal Medicine

## 2020-07-25 ENCOUNTER — Other Ambulatory Visit: Payer: Self-pay

## 2020-07-25 ENCOUNTER — Ambulatory Visit (INDEPENDENT_AMBULATORY_CARE_PROVIDER_SITE_OTHER): Payer: Medicare Other

## 2020-07-25 ENCOUNTER — Encounter: Payer: Self-pay | Admitting: Internal Medicine

## 2020-07-25 VITALS — BP 136/84 | HR 68 | Temp 98.5°F | Ht 68.0 in | Wt 224.0 lb

## 2020-07-25 DIAGNOSIS — M19071 Primary osteoarthritis, right ankle and foot: Secondary | ICD-10-CM | POA: Diagnosis not present

## 2020-07-25 DIAGNOSIS — I10 Essential (primary) hypertension: Secondary | ICD-10-CM

## 2020-07-25 DIAGNOSIS — M89319 Hypertrophy of bone, unspecified shoulder: Secondary | ICD-10-CM | POA: Diagnosis not present

## 2020-07-25 DIAGNOSIS — E785 Hyperlipidemia, unspecified: Secondary | ICD-10-CM | POA: Diagnosis not present

## 2020-07-25 DIAGNOSIS — M89311 Hypertrophy of bone, right shoulder: Secondary | ICD-10-CM | POA: Diagnosis not present

## 2020-07-25 DIAGNOSIS — Z1231 Encounter for screening mammogram for malignant neoplasm of breast: Secondary | ICD-10-CM | POA: Insufficient documentation

## 2020-07-25 DIAGNOSIS — Z0001 Encounter for general adult medical examination with abnormal findings: Secondary | ICD-10-CM | POA: Insufficient documentation

## 2020-07-25 LAB — LIPID PANEL
Cholesterol: 168 mg/dL (ref 0–200)
HDL: 78.8 mg/dL (ref >39.00)
LDL Cholesterol: 65 mg/dL (ref 0–99)
NonHDL: 89.62
Total CHOL/HDL Ratio: 2
Triglycerides: 121 mg/dL (ref 0.0–149.0)
VLDL: 24.2 mg/dL (ref 0.0–40.0)

## 2020-07-25 LAB — CBC WITH DIFFERENTIAL/PLATELET
Basophils Absolute: 0.1 10*3/uL (ref 0.0–0.1)
Basophils Relative: 1.2 % (ref 0.0–3.0)
Eosinophils Absolute: 0.1 10*3/uL (ref 0.0–0.7)
Eosinophils Relative: 1.7 % (ref 0.0–5.0)
HCT: 41.5 % (ref 36.0–46.0)
Hemoglobin: 14 g/dL (ref 12.0–15.0)
Lymphocytes Relative: 31 % (ref 12.0–46.0)
Lymphs Abs: 1.9 10*3/uL (ref 0.7–4.0)
MCHC: 33.8 g/dL (ref 30.0–36.0)
MCV: 94.4 fl (ref 78.0–100.0)
Monocytes Absolute: 0.5 10*3/uL (ref 0.1–1.0)
Monocytes Relative: 8.8 % (ref 3.0–12.0)
Neutro Abs: 3.4 10*3/uL (ref 1.4–7.7)
Neutrophils Relative %: 57.3 % (ref 43.0–77.0)
Platelets: 251 10*3/uL (ref 150.0–400.0)
RBC: 4.39 Mil/uL (ref 3.87–5.11)
RDW: 13.3 % (ref 11.5–15.5)
WBC: 6 10*3/uL (ref 4.0–10.5)

## 2020-07-25 LAB — BASIC METABOLIC PANEL
BUN: 22 mg/dL (ref 6–23)
CO2: 28 mEq/L (ref 19–32)
Calcium: 9.4 mg/dL (ref 8.4–10.5)
Chloride: 105 mEq/L (ref 96–112)
Creatinine, Ser: 0.67 mg/dL (ref 0.40–1.20)
GFR: 89.16 mL/min (ref >60.00)
Glucose, Bld: 91 mg/dL (ref 70–99)
Potassium: 4.4 mEq/L (ref 3.5–5.1)
Sodium: 140 mEq/L (ref 135–145)

## 2020-07-25 LAB — HEPATIC FUNCTION PANEL
ALT: 28 U/L (ref 0–35)
AST: 26 U/L (ref 0–37)
Albumin: 4.2 g/dL (ref 3.5–5.2)
Alkaline Phosphatase: 66 U/L (ref 39–117)
Bilirubin, Direct: 0.1 mg/dL (ref 0.0–0.3)
Total Bilirubin: 0.5 mg/dL (ref 0.2–1.2)
Total Protein: 7.1 g/dL (ref 6.0–8.3)

## 2020-07-25 LAB — C-REACTIVE PROTEIN: CRP: <1 mg/dL (ref 0.5–20.0)

## 2020-07-25 LAB — TSH: TSH: 1.41 u[IU]/mL (ref 0.35–4.50)

## 2020-07-25 NOTE — Progress Notes (Unsigned)
Subjective:  Patient ID: Dawn [name], female    DOB: 29-Jan-1951  Age: 70 y.o. MRN: [id number]  CC: Hypertension and Hyperlipidemia  This visit occurred during the SARS-CoV-2 public health emergency.  Safety protocols were in place, including screening questions prior to the visit, additional usage of staff PPE, and extensive cleaning of exam room while observing appropriate contact time as indicated for disinfecting solutions.    HPI Dawn [name] presents for f/up -  She is concerned about her right collarbone as it approaches the sternum.  She says it does not bother her with respect to pain or swelling but she said several people have commented that the right side looks larger than the left side.  She denies any injury or trauma.  She is active and denies chest pain, shortness of breath, palpitations, edema, or fatigue.  She tells me her blood pressure has been well controlled.  Outpatient Medications Prior to Visit  Medication Sig Dispense Refill  . losartan (COZAAR) 100 MG tablet Take 1 tablet (100 mg total) by mouth daily. 90 tablet 1  . simvastatin (ZOCOR) 20 MG tablet Take 1 tablet (20 mg total) by mouth at bedtime. 90 tablet 1  . mupirocin ointment (BACTROBAN) 2 % APPLY TO AFFECTED AREA EVERY DAY     No facility-administered medications prior to visit.    ROS Review of Systems  Constitutional: Negative.  Negative for chills, diaphoresis, fatigue and fever.  HENT: Negative.   Eyes: Negative.   Respiratory: Negative for cough, chest tightness, shortness of breath and wheezing.   Cardiovascular: Negative for chest pain, palpitations and leg swelling.  Gastrointestinal: Negative for abdominal pain, constipation, diarrhea, nausea and vomiting.  Genitourinary: Negative.  Negative for difficulty urinating.  Musculoskeletal: Negative.  Negative for arthralgias and myalgias.  Skin: Negative.  Negative for color change, pallor and rash.  Neurological: Negative.  Negative  for dizziness, weakness and numbness.  Hematological: Negative for adenopathy. Does not bruise/bleed easily.  Psychiatric/Behavioral: Negative.     Objective:  BP 136/84   Pulse 68   Temp 98.5 F (36.9 C) (Oral)   Ht 5\' 8"  (1.727 m)   Wt 224 lb (101.6 kg)   SpO2 98%   BMI 34.06 kg/m   BP Readings from Last 3 Encounters:  07/25/20 136/84  08/26/19 (!) 148/88  07/24/18 121/75    Wt Readings from Last 3 Encounters:  07/25/20 224 lb (101.6 kg)  08/26/19 214 lb (97.1 kg)  07/23/18 218 lb 1.6 oz (98.9 kg)    Physical Exam Vitals reviewed.  Constitutional:      Appearance: Normal appearance.  HENT:     Nose: Nose normal.     Mouth/Throat:     Mouth: Mucous membranes are moist.  Eyes:     General: No scleral icterus.    Conjunctiva/sclera: Conjunctivae normal.  Cardiovascular:     Rate and Rhythm: Normal rate and regular rhythm.     Heart sounds: No murmur heard.   Pulmonary:     Effort: Pulmonary effort is normal.     Breath sounds: No stridor. No wheezing, rhonchi or rales.  Chest:    Abdominal:     General: Abdomen is flat.     Palpations: There is no mass.     Tenderness: There is no abdominal tenderness. There is no guarding.  Musculoskeletal:        General: No swelling. Normal range of motion.     Cervical back: Neck supple.  Right lower leg: No edema.     Left lower leg: No edema.  Lymphadenopathy:     Cervical: No cervical adenopathy.  Skin:    General: Skin is warm and dry.  Neurological:     General: No focal deficit present.     Mental Status: She is alert.     Lab Results  Component Value Date   WBC 6.0 07/25/2020   HGB 14.0 07/25/2020   HCT 41.5 07/25/2020   PLT 251.0 07/25/2020   GLUCOSE 91 07/25/2020   CHOL 168 07/25/2020   TRIG 121.0 07/25/2020   HDL 78.80 07/25/2020   LDLDIRECT 112.0 08/26/2019   LDLCALC 65 07/25/2020   ALT 28 07/25/2020   AST 26 07/25/2020   NA 140 07/25/2020   K 4.4 07/25/2020   CL 105 07/25/2020    CREATININE 0.67 07/25/2020   BUN 22 07/25/2020   CO2 28 07/25/2020   TSH 1.41 07/25/2020   INR 0.9 06/11/2018   HGBA1C 5.0 06/11/2018    DG Clavicle Right  Result Date: 07/26/2020 CLINICAL DATA:  Deformity the sternoclavicular joint. EXAM: RIGHT CLAVICLE - 2+ VIEWS COMPARISON:  None. FINDINGS: There are moderate degenerative changes of the right AC joint with inferiorly and superiorly oriented osteophytes. There is no acute displaced fracture. No definite radiographic abnormality involving the sternoclavicular joint. No evidence for displaced rib fracture involving the visualized ribs. IMPRESSION: 1. Degenerative changes of the right AC joint. 2. No acute osseous abnormality. 3. No significant abnormality involving the sternal clavicular joint. If symptoms persist, further evaluation with a contrast enhanced CT is recommended. Electronically Signed   By: Constance Holster M.D.   On: 07/26/2020 13:58    Assessment & Plan:   Dawn [name] was seen today for hypertension and hyperlipidemia.  Diagnoses and all orders for this visit:  Essential hypertension- BP is well controlled. -     CBC with Differential/Platelet; Future -     Basic metabolic panel; Future -     Basic metabolic panel -     CBC with Differential/Platelet  Hyperlipidemia LDL goal <130- LDL goal achieved. Doing well on the statin -     Lipid panel; Future -     TSH; Future -     Hepatic function panel; Future -     Hepatic function panel -     TSH -     Lipid panel  Clavicle enlargement- Based on lack of symptoms, the exam, labs, and plain films this is degenerative joint disease.  I offered her reassurance. -     C-reactive protein; Future -     DG Clavicle Right; Future -     C-reactive protein  Visit for screening mammogram   I have discontinued Hoyle Sauer T. Ditton "Dawn [name]"'s mupirocin ointment. I am also having her maintain her losartan and simvastatin.  No orders of the defined types were placed in this  encounter.    Follow-up: Return in about 6 months (around 01/25/2021).  Scarlette Calico, MD

## 2020-07-25 NOTE — Patient Instructions (Signed)

## 2020-07-27 ENCOUNTER — Encounter: Payer: Self-pay | Admitting: Internal Medicine

## 2020-08-08 ENCOUNTER — Other Ambulatory Visit: Payer: Self-pay | Admitting: Internal Medicine

## 2020-08-08 DIAGNOSIS — Z1231 Encounter for screening mammogram for malignant neoplasm of breast: Secondary | ICD-10-CM

## 2020-08-09 ENCOUNTER — Other Ambulatory Visit: Payer: Self-pay

## 2020-08-09 ENCOUNTER — Ambulatory Visit
Admission: RE | Admit: 2020-08-09 | Discharge: 2020-08-09 | Disposition: A | Discharge status: Home / Self Care | Payer: Medicare Other | Source: Ambulatory Visit | Attending: Internal Medicine | Admitting: Internal Medicine

## 2020-08-09 DIAGNOSIS — Z1231 Encounter for screening mammogram for malignant neoplasm of breast: Secondary | ICD-10-CM | POA: Diagnosis not present

## 2020-09-06 ENCOUNTER — Telehealth: Payer: Self-pay | Admitting: Pharmacist

## 2020-09-06 NOTE — Progress Notes (Signed)
    Chronic Care Management Pharmacy Assistant   Name: Dawn [name]  MRN: [id number] DOB: [birth year]   Reason for Encounter: Hypertension Disease State Call   Conditions to be addressed/monitored: HTN   Recent office visits:  07/25/20 Dr. Ronnald Ramp , discontinued mupriocin  Recent consult visits:  None ID  Hospital visits:  None in previous 6 months  Medications: Outpatient Encounter Medications as of 09/06/2020  Medication Sig  . losartan (COZAAR) 100 MG tablet Take 1 tablet (100 mg total) by mouth daily.  . simvastatin (ZOCOR) 20 MG tablet Take 1 tablet (20 mg total) by mouth at bedtime.   No facility-administered encounter medications on file as of 09/06/2020.    Reviewed chart prior to disease state call. Spoke with patient regarding BP  Recent Office Vitals: BP Readings from Last 3 Encounters:  07/25/20 136/84  08/26/19 (!) 148/88  07/24/18 121/75   Pulse Readings from Last 3 Encounters:  07/25/20 68  08/26/19 73  07/24/18 62    Wt Readings from Last 3 Encounters:  07/25/20 224 lb (101.6 kg)  08/26/19 214 lb (97.1 kg)  07/23/18 218 lb 1.6 oz (98.9 kg)     Kidney Function Lab Results  Component Value Date/Time   CREATININE 0.67 07/25/2020 02:08 PM   CREATININE 0.77 08/26/2019 04:21 PM   GFR 89.16 07/25/2020 02:08 PM   GFRNONAA >60 07/24/2018 05:18 AM   GFRAA >60 07/24/2018 05:18 AM    BMP Latest Ref Rng & Units 07/25/2020 08/26/2019 07/24/2018  Glucose 70 - 99 mg/dL 91 89 117(H)  BUN 6 - 23 mg/dL 22 28(H) 36(H)  Creatinine 0.40 - 1.20 mg/dL 0.67 0.77 0.92  Sodium 135 - 145 mEq/L 140 141 138  Potassium 3.5 - 5.1 mEq/L 4.4 4.8 4.4  Chloride 96 - 112 mEq/L 105 105 106  CO2 19 - 32 mEq/L 28 27 22   Calcium 8.4 - 10.5 mg/dL 9.4 9.7 8.7(L)    . Current antihypertensive regimen:  Patient is taking losartan 100 mg daily ad simvastatin 20 mg bedtime . How often are you checking your Blood Pressure? Patient states that she does not check blood  pressure  . Current home BP readings: Patient last blood pressure reading was 136/84 at Dr. Ronnald Ramp offices 07/25/20  . What recent interventions/DTPs have been made by any provider to improve Blood Pressure control since last CPP Visit: Patient to continue blood pressure meds per Dr. Ronnald Ramp  . Any recent hospitalizations or ED visits since last visit with CPP? None ID  . What diet changes have been made to improve Blood Pressure Control?  Patient states she has not made any changes to her diet  . What exercise is being done to improve your Blood Pressure Control?  Patient states that she has not made any changes to exercise  Adherence Review: Is the patient currently on ACE/ARB medication? Losartan Does the patient have >5 day gap between last estimated fill dates? Yes, patient states that she needs refill for Losartan   Star Rating Drugs: Losartan 07/18/20 30 ds Simvastatin 08/07/20 ds  Ethelene Hal Clinical Pharmacist Assistant [phone]  Time spent:25

## 2020-09-18 ENCOUNTER — Other Ambulatory Visit: Payer: Self-pay | Admitting: Internal Medicine

## 2020-09-18 DIAGNOSIS — E785 Hyperlipidemia, unspecified: Secondary | ICD-10-CM

## 2020-09-18 MED ORDER — SIMVASTATIN 20 MG PO TABS: 20.0000 mg | ORAL_TABLET | Freq: Every day | ORAL | 1 refills | Status: DC

## 2020-09-19 ENCOUNTER — Telehealth (INDEPENDENT_AMBULATORY_CARE_PROVIDER_SITE_OTHER): Payer: Medicare Other | Admitting: Family Medicine

## 2020-09-19 DIAGNOSIS — U071 COVID-19: Secondary | ICD-10-CM

## 2020-09-19 MED ORDER — BENZONATATE 100 MG PO CAPS
100.0000 mg | ORAL_CAPSULE | Freq: Three times a day (TID) | ORAL | 0 refills | Status: DC | PRN
  Filled 2020-09-19: qty 20, 7d supply, fill #0

## 2020-09-19 MED ORDER — NIRMATRELVIR/RITONAVIR (PAXLOVID)TABLET
3.0000 | ORAL_TABLET | Freq: Two times a day (BID) | ORAL | 0 refills | Status: AC
  Filled 2020-09-19: qty 30, 5d supply, fill #0

## 2020-09-19 NOTE — Patient Instructions (Addendum)
HOME CARE TIPS:  -I sent the medication(s) we discussed to your pharmacy: Meds ordered this encounter  Medications  . nirmatrelvir/ritonavir EUA (PAXLOVID) TABS    Sig: Take 3 tablets by mouth 2 (two) times daily for 5 days. Patient GFR is 89. Take nirmatrelvir (150 mg) 2 tablet(s) twice daily for 5 days and ritonavir (100 mg) one tablet twice daily for 5 days.    Dispense:  30 tablet    Refill:  0  . benzonatate (TESSALON PERLES) 100 MG capsule    Sig: Take 1 capsule (100 mg total) by mouth 3 (three) times daily as needed.    Dispense:  20 capsule    Refill:  0     -I sent in the [facility] treatment or referral you requested per our discussion. Please see the information provided below and discuss further with the pharmacist/treatment team.  THERE is a potential drug interaction with the blood pressure medication losartan. Can monitor BP or half the dose of your BP medication while taking and discuss with pharmacist or Primary Care Doctor if any issues. There is also a possible interaction with cholesterol medications such as simvastatin. Please discuss with pharmacist and hold while taking paxlovid.   -can use nasal saline a few times per day if you have nasal congestion; sometimes  a short course of Afrin nasal spray for 3 days can help with symptoms as well  -stay hydrated, drink plenty of fluids and eat small healthy meals - avoid dairy  -If the Covid test is positive, check out the [facility] website for more information on home care, transmission and treatment for COVID19  -follow up with your doctor in 2-3 days unless improving and feeling better  -stay home while sick, except to seek medical care. If you have COVID19, ideally it would be best to stay home for a full 10 days since the onset of symptoms PLUS one day of no fever and feeling better. Wear a good mask that fits snugly (such as N95 or KN95) if around others to reduce the risk of transmission.  It was nice to meet you today,  and I really hope you are feeling better soon. I help [place] out with telemedicine visits on Tuesdays and Thursdays and am available for visits on those days. If you have any concerns or questions following this visit please schedule a follow up visit with your Primary Care doctor or seek care at a local urgent care clinic to avoid delays in care.    Seek in person care or schedule a follow up video visit promptly if your symptoms worsen, new concerns arise or you are not improving with treatment. Call 911 and/or seek emergency care if your symptoms are severe or life threatening.    FACT SHEET FOR PATIENTS, PARENTS, AND CAREGIVERS EMERGENCY USE AUTHORIZATION (EUA) OF PAXLOVID FOR CORONAVIRUS DISEASE 2019 (COVID-19) You are being given this Fact Sheet because your healthcare provider believes it is necessary to provide you with PAXLOVID for the treatment of mild-to-moderate coronavirus disease (COVID-19) caused by the SARS-CoV-2 virus. This Fact Sheet contains information to help you understand the risks and benefits of taking the PAXLOVID you have received or may receive. The U.S. Food and Drug Administration (FDA) has issued an Emergency Use Authorization (EUA) to make PAXLOVID available during the COVID-19 pandemic (for more details about an EUA please see "What is an Emergency Use Authorization?" at the end of this document). PAXLOVID is not an FDA-approved medicine in the [place]. Read  this Fact Sheet for information about PAXLOVID. Talk to your healthcare provider about your options or if you have any questions. It is your choice to take PAXLOVID.  What is COVID-19? COVID-19 is caused by a virus called a coronavirus. You can get COVID-19 through close contact with another person who has the virus. COVID-19 illnesses have ranged from very mild-to-severe, including illness resulting in death. While information so far suggests that most COVID-19 illness is mild,  serious illness can happen and may cause some of your other medical conditions to become worse. Older people and people of all ages with severe, long lasting (chronic) medical conditions like heart disease, lung disease, and diabetes, for example seem to be at higher risk of being hospitalized for COVID-19.  What is PAXLOVID? PAXLOVID is an investigational medicine used to treat mild-to-moderate COVID-19 in adults and children [30 years of age and older weighing at least 63 pounds (80 kg)] with positive results of direct SARS-CoV-2 viral testing, and who are at high risk for progression to severe COVID-19, including hospitalization or death. PAXLOVID is investigational because it is still being studied. There is limited information about the safety and effectiveness of using PAXLOVID to treat people with mild-to-moderate COVID-19.  The FDA has authorized the emergency use of PAXLOVID for the treatment of mild-tomoderate COVID-19 in adults and children [41 years of age and older weighing at least 6 pounds (32 kg)] with a positive test for the virus that causes COVID-19, and who are at high risk for progression to severe COVID-19, including hospitalization or death, under an EUA. 1 Revised: 28 July 2020   What should I tell my healthcare provider before I take PAXLOVID? Tell your healthcare provider if you: . Have any allergies . Have liver or kidney disease . Are pregnant or plan to become pregnant . Are breastfeeding a child . Have any serious illnesses  Tell your healthcare provider about all the medicines you take, including prescription and over-the-counter medicines, vitamins, and herbal supplements. Some medicines may interact with PAXLOVID and may cause serious side effects. Keep a list of your medicines to show your healthcare provider and pharmacist when you get a new medicine.  You can ask your healthcare provider or pharmacist for a list of medicines that interact with  PAXLOVID. Do not start taking a new medicine without telling your healthcare provider. Your healthcare provider can tell you if it is safe to take PAXLOVID with other medicines.  Tell your healthcare provider if you are taking combined hormonal contraceptive. PAXLOVID may affect how your birth control pills work. Females who are able to become pregnant should use another effective alternative form of contraception or an additional barrier method of contraception. Talk to your healthcare provider if you have any questions about contraceptive methods that might be right for you.  How do I take PAXLOVID? Marland Kitchen PAXLOVID consists of 2 medicines: nirmatrelvir and ritonavir. o Take 2 pink tablets of nirmatrelvir with 1 white tablet of ritonavir by mouth 2 times each day (in the morning and in the evening) for 5 days. For each dose, take all 3 tablets at the same time. o If you have kidney disease, talk to your healthcare provider. You may need a different dose. Ricka Burdock the tablets whole. Do not chew, break, or crush the tablets. . Take PAXLOVID with or without food. . Do not stop taking PAXLOVID without talking to your healthcare provider, even if you feel better. . If you miss a dose of PAXLOVID  within 8 hours of the time it is usually taken, take it as soon as you remember. If you miss a dose by more than 8 hours, skip the missed dose and take the next dose at your regular time. Do not take 2 doses of PAXLOVID at the same time. . If you take too much PAXLOVID, call your healthcare provider or go to the nearest hospital emergency room right away. . If you are taking a ritonavir- or cobicistat-containing medicine to treat hepatitis C or Human Immunodeficiency Virus (HIV), you should continue to take your medicine as prescribed by your healthcare provider. 2 Revised: 28 July 2020    Talk to your healthcare provider if you do not feel better or if you feel worse after 5 days.  Who should  generally not take PAXLOVID? Do not take PAXLOVID if: . You are allergic to nirmatrelvir, ritonavir, or any of the ingredients in PAXLOVID. Marland Kitchen You are taking any of the following medicines: o Alfuzosin o Pethidine, propoxyphene o Ranolazine o Amiodarone, dronedarone, flecainide, propafenone, quinidine o Colchicine o Lurasidone, pimozide, clozapine o Dihydroergotamine, ergotamine, methylergonovine o Lovastatin, simvastatin o Sildenafil (Revatio) for pulmonary arterial hypertension (PAH) o Triazolam, oral midazolam o Apalutamide o Carbamazepine, phenobarbital, phenytoin o Rifampin o St. John's Wort (hypericum perforatum) Taking PAXLOVID with these medicines may cause serious or life-threatening side effects or affect how PAXLOVID works.  These are not the only medicines that may cause serious side effects if taken with PAXLOVID. PAXLOVID may increase or decrease the levels of multiple other medicines. It is very important to tell your healthcare provider about all of the medicines you are taking because additional laboratory tests or changes in the dose of your other medicines may be necessary while you are taking PAXLOVID. Your healthcare provider may also tell you about specific symptoms to watch out for that may indicate that you need to stop or decrease the dose of some of your other medicines.  What are the important possible side effects of PAXLOVID? Possible side effects of PAXLOVID are: . Allergic Reactions. Allergic reactions can happen in people taking PAXLOVID, even after only 1 dose. Stop taking PAXLOVID and call your healthcare provider right away if you get any of the following symptoms of an allergic reaction: o hives o trouble swallowing or breathing o swelling of the mouth, lips, or face o throat tightness o hoarseness 3 Revised: 28 July 2020  o skin rash . Liver Problems. Tell your healthcare provider right away if you have any of these signs and symptoms  of liver problems: loss of appetite, yellowing of your skin and the whites of eyes (jaundice), dark-colored urine, pale colored stools and itchy skin, stomach area (abdominal) pain. Marland Kitchen Resistance to HIV Medicines. If you have untreated HIV infection, PAXLOVID may lead to some HIV medicines not working as well in the future. . Other possible side effects include: o altered sense of taste o diarrhea o high blood pressure o muscle aches These are not all the possible side effects of PAXLOVID. Not many people have taken PAXLOVID. Serious and unexpected side effects may happen. PAXLOVID is still being studied, so it is possible that all of the risks are not known at this time.  What other treatment choices are there? Veklury (remdesivir) is FDA-approved for the treatment of mild-to-moderate FXOVA-91 in certain adults and children. Talk with your doctor to see if Marijean Heath is appropriate for you. Like PAXLOVID, FDA may also allow for the emergency use of other medicines to  treat people with COVID-19. Go to https://price.info/ for information on the emergency use of other medicines that are authorized by FDA to treat people with COVID-19. Your healthcare provider may talk with you about clinical trials for which you may be eligible. It is your choice to be treated or not to be treated with PAXLOVID. Should you decide not to receive it or for your child not to receive it, it will not change your standard medical care.  What if I am pregnant or breastfeeding? There is no experience treating pregnant women or breastfeeding mothers with PAXLOVID. For a mother and unborn baby, the benefit of taking PAXLOVID may be greater than the risk from the treatment. If you are pregnant, discuss your options and specific situation with your healthcare provider. It is recommended that you use effective  barrier contraception or do not have sexual activity while taking PAXLOVID. If you are breastfeeding, discuss your options and specific situation with your healthcare provider. 4 Revised: 28 July 2020   How do I report side effects with PAXLOVID? Contact your healthcare provider if you have any side effects that bother you or do not go away. Report side effects to FDA MedWatch at SmoothHits.hu or call 1-800-FDA1088 or you can report side effects to [employer]. at the contact information provided below. Website Fax number Telephone number www.pfizersafetyreporting.com [phone] How should I store [place]? Store PAXLOVID tablets at room temperature, between 68?F to 77?F (20?C to 25?C). How can I learn more about COVID-19? Marland Kitchen Ask your healthcare provider. . Visit [email or web address] Minette Brine your local or state public health department. What is an Emergency Use Authorization (EUA)? The [place] FDA has made PAXLOVID available under an emergency access mechanism called an Emergency Use Authorization (EUA). The EUA is supported by a Education officer, museum and Human Service (HHS) declaration that circumstances exist to justify the emergency use of drugs and biological products during the COVID-19 pandemic. PAXLOVID for the treatment of mild-to-moderate COVID-19 in adults and children [48 years of age and older weighing at least 55 pounds (65 kg)] with positive results of direct SARS-CoV-2 viral testing, and who are at high risk for progression to severe COVID-19, including hospitalization or death, has not undergone the same type of review as an FDA-approved product. In issuing an EUA under the XAJOI-78 public health emergency, the FDA has determined, among other things, that based on the total amount of scientific evidence available including data from adequate and well-controlled clinical trials, if available, it is reasonable to believe that the  product may be effective for diagnosing, treating, or preventing COVID-19, or a serious or life-threatening disease or condition caused by COVID-19; that the known and potential benefits of the product, when used to diagnose, treat, or prevent such disease or condition, outweigh the known and potential risks of such product; and that there are no adequate, approved, and available alternatives. All of these criteria must be met to allow for the product to be used in the treatment of patients during the COVID-19 pandemic. The EUA for PAXLOVID is in effect for the duration of the COVID-19 declaration justifying emergency use of this product, unless terminated or revoked (after which the products may no longer be used under the EUA). 5 Revised: 28 July 2020     Additional Information For general questions, visit the website or call the telephone number provided below. Website Telephone number www.COVID19oralRx.com [phone] (1-877-C19-PACK) You can also go to www.pfizermedinfo.com or call [phone] for more information. [id number] Revised:  28 July 2020
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
## 2020-09-20 ENCOUNTER — Other Ambulatory Visit (HOSPITAL_COMMUNITY): Payer: Self-pay

## 2020-09-21 ENCOUNTER — Telehealth: Payer: Self-pay | Admitting: Internal Medicine

## 2020-09-21 NOTE — Telephone Encounter (Signed)
Called pt, LVM.   

## 2020-09-21 NOTE — Telephone Encounter (Signed)
See someone else tonight or tomorrow

## 2020-09-21 NOTE — Telephone Encounter (Signed)
She needs to have her liver enzymes checked  TJ

## 2020-09-21 NOTE — Telephone Encounter (Signed)
nirmatrelvir/ritonavir EUA (PAXLOVID) TABS Patient was reading over the paperwork of the medication and saw there was a few things that if they happened to let her doctor know, and she has been having light stool and she has a bad taste in her mouth that does not go away.

## 2020-09-22 ENCOUNTER — Telehealth (INDEPENDENT_AMBULATORY_CARE_PROVIDER_SITE_OTHER): Payer: Medicare Other | Admitting: Internal Medicine

## 2020-09-22 ENCOUNTER — Other Ambulatory Visit: Payer: Self-pay

## 2020-09-22 DIAGNOSIS — J3089 Other allergic rhinitis: Secondary | ICD-10-CM

## 2020-09-22 DIAGNOSIS — E785 Hyperlipidemia, unspecified: Secondary | ICD-10-CM

## 2020-09-22 DIAGNOSIS — U071 COVID-19: Secondary | ICD-10-CM | POA: Diagnosis not present
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
## 2020-09-25 DIAGNOSIS — Z20822 Contact with and (suspected) exposure to covid-19: Secondary | ICD-10-CM | POA: Diagnosis not present

## 2020-09-28 ENCOUNTER — Encounter: Payer: Self-pay | Admitting: Internal Medicine

## 2020-09-28 DIAGNOSIS — U071 COVID-19: Secondary | ICD-10-CM | POA: Insufficient documentation

## 2020-09-28 NOTE — Patient Instructions (Signed)
Ok to finish meds as rx  Please to HOLD the statin for 5 days on paxlovid and 3 days after

## 2020-09-28 NOTE — Assessment & Plan Note (Signed)
Ok to finish the paxlovid as rx, no other symptomatic meds needed, to f/u any worsening symptoms or concerns

## 2020-09-28 NOTE — Assessment & Plan Note (Signed)
Pt asked to HOLD the statin while on paxlovid and 3 days after, then restart,, continue low chol diet

## 2020-09-28 NOTE — Assessment & Plan Note (Signed)
Stable, to continue otc med such as allegra prn,  to f/u any worsening symptoms or concerns

## 2020-10-07 ENCOUNTER — Other Ambulatory Visit: Payer: Self-pay | Admitting: Internal Medicine

## 2020-10-07 DIAGNOSIS — I10 Essential (primary) hypertension: Secondary | ICD-10-CM

## 2020-10-25 ENCOUNTER — Telehealth: Payer: Self-pay | Admitting: Pharmacist

## 2020-10-26 NOTE — Chronic Care Management (AMB) (Signed)
    Chronic Care Management Pharmacy Assistant   Name: Dawn [name]  MRN: [id number] DOB: Oct 10, 1950   Reason for Encounter: Disease State   Conditions to be addressed/monitored: HTN  Primary concerns for visit include: Blood pressure   Recent office visits:  None ID  Recent consult visits:  09/19/20 Colin Benton, DO ordered benzonatate 100 mg and paxlovid for covid 09/22/20 Dr. Cathlean Cower- patient to hold statin for 5 days  Hospital visits:  None in previous 6 months  Medications: Outpatient Encounter Medications as of 10/25/2020  Medication Sig   benzonatate (TESSALON PERLES) 100 MG capsule Take 1 capsule (100 mg total) by mouth 3 (three) times daily as needed.   losartan (COZAAR) 100 MG tablet TAKE 1 TABLET BY MOUTH EVERY DAY   simvastatin (ZOCOR) 20 MG tablet Take 1 tablet (20 mg total) by mouth at bedtime.   No facility-administered encounter medications on file as of 10/25/2020.    Pharmacist Review Reviewed chart prior to disease state call. Spoke with patient regarding BP  Recent Office Vitals: BP Readings from Last 3 Encounters:  07/25/20 136/84  08/26/19 (!) 148/88  07/24/18 121/75   Pulse Readings from Last 3 Encounters:  07/25/20 68  08/26/19 73  07/24/18 62    Wt Readings from Last 3 Encounters:  07/25/20 224 lb (101.6 kg)  08/26/19 214 lb (97.1 kg)  07/23/18 218 lb 1.6 oz (98.9 kg)     Kidney Function Lab Results  Component Value Date/Time   CREATININE 0.67 07/25/2020 02:08 PM   CREATININE 0.77 08/26/2019 04:21 PM   GFR 89.16 07/25/2020 02:08 PM   GFRNONAA >60 07/24/2018 05:18 AM   GFRAA >60 07/24/2018 05:18 AM    BMP Latest Ref Rng & Units 07/25/2020 08/26/2019 07/24/2018  Glucose 70 - 99 mg/dL 91 89 117(H)  BUN 6 - 23 mg/dL 22 28(H) 36(H)  Creatinine 0.40 - 1.20 mg/dL 0.67 0.77 0.92  Sodium 135 - 145 mEq/L 140 141 138  Potassium 3.5 - 5.1 mEq/L 4.4 4.8 4.4  Chloride 96 - 112 mEq/L 105 105 106  CO2 19 - 32 mEq/L 28 27 22   Calcium 8.4  - 10.5 mg/dL 9.4 9.7 8.7(L)    Current antihypertensive regimen:  Losartan 100 mg daily  How often are you checking your Blood Pressure?  Patient states that she does not ever take blood pressure, but that she will go this week and invest in a blood pressure cuff  Current home BP readings: Patient last blood pressure reading was 136/82 on 07/25/20 at Dr. Ronnald Ramp office  What recent interventions/DTPs have been made by any provider to improve Blood Pressure control since last CPP Visit: BP is well controlled, per Dr. Ronnald Ramp on 07/25/20  Any recent hospitalizations or ED visits since last visit with CPP? No  What diet changes have been made to improve Blood Pressure Control?  Patient states that she has not made any changes to diet  What exercise is being done to improve your Blood Pressure Control?  Patient states that she is doing water excercise  Adherence Review: Is the patient currently on ACE/ARB medication? Yes Does the patient have >5 day gap between last estimated fill dates? No   Star Rating Drugs: Losartan 10/07/20 90 ds  [place] Pharmacist Assistant [phone]   Time spent:

## 2020-11-09 NOTE — Progress Notes (Signed)
Chart Review  Reviewed chart for medication changes and adherence.  No OVs, Consults, or hospital visits since last care coordination call / Pharmacist visit. No medication changes indicated  No gaps in adherence identified. Patient has follow up scheduled with pharmacy team. No further action required.   Orinda Kenner, [place] Clinical Pharmacists Assistant [phone]  Time Spent: 75

## 2020-11-09 NOTE — Progress Notes (Signed)
    Chronic Care Management Pharmacy Assistant   Name: Dawn [name]  MRN: [id number] DOB: 1951/02/03   Reason for Encounter: Chart Review    Medications: Outpatient Encounter Medications as of 10/25/2020  Medication Sig   benzonatate (TESSALON PERLES) 100 MG capsule Take 1 capsule (100 mg total) by mouth 3 (three) times daily as needed.   losartan (COZAAR) 100 MG tablet TAKE 1 TABLET BY MOUTH EVERY DAY   simvastatin (ZOCOR) 20 MG tablet Take 1 tablet (20 mg total) by mouth at bedtime.   No facility-administered encounter medications on file as of 10/25/2020.   Pharmacist Review  Reviewed chart for medication changes and adherence.  No OVs, Consults, or hospital visits since last care coordination call / Pharmacist visit. No medication changes indicated  No gaps in adherence identified. Patient has follow up scheduled with pharmacy team. No further action required.   [place] Pharmacist Assistant [phone]   Time spent:8

## 2020-12-04 ENCOUNTER — Other Ambulatory Visit: Payer: Self-pay | Admitting: Internal Medicine

## 2020-12-04 ENCOUNTER — Telehealth: Payer: Self-pay | Admitting: Internal Medicine

## 2020-12-04 ENCOUNTER — Ambulatory Visit: Payer: Medicare Other | Attending: Internal Medicine

## 2020-12-04 DIAGNOSIS — Z23 Encounter for immunization: Secondary | ICD-10-CM

## 2020-12-04 DIAGNOSIS — F40243 Fear of flying: Secondary | ICD-10-CM | POA: Insufficient documentation

## 2020-12-04 MED ORDER — ALPRAZOLAM 0.5 MG PO TABS: 0.5000 mg | ORAL_TABLET | Freq: Two times a day (BID) | ORAL | 0 refills | Status: DC | PRN

## 2020-12-04 NOTE — Telephone Encounter (Signed)
Ok for pt to proceed?

## 2020-12-04 NOTE — Telephone Encounter (Signed)
Patient is concerned whether she should get the booster for COVID due to testing positive in mid- May. Stated she will be traveling internationally in the a couple of weeks.   Please advise and callback.

## 2020-12-04 NOTE — Telephone Encounter (Signed)
Pt has been informed and expressed understanding.   Pt stated that she dropped her losartan and no longer has any medication. Would like to know if a refill could be called in.   She stated that next week she is going on vacation on a 5-6 hour flight and was inquiring about a few xanax to help her on the flight there and back.   Please advise.

## 2020-12-04 NOTE — Progress Notes (Signed)
   Covid-19 Vaccination Clinic  Name:  Dawn [name]    MRN: [id number] DOB: December 23, 1950  12/04/2020  Ms. Dawn [name] was observed post Covid-19 immunization for 15 minutes without incident. She was provided with Vaccine Information Sheet and instruction to access the V-Safe system.   Ms. Dawn [name] was instructed to call 911 with any severe reactions post vaccine: Difficulty breathing  Swelling of face and throat  A fast heartbeat  A bad rash all over body  Dizziness and weakness   Immunizations Administered     Name Date Dose VIS Date Route   PFIZER Comrnaty(Gray TOP) Covid-19 Vaccine 12/04/2020  2:12 PM 0.3 mL 04/20/2020 Intramuscular   Manufacturer: [place]   Lot: [id number]   [place]: [id number]

## 2020-12-05 ENCOUNTER — Ambulatory Visit: Payer: Medicare Other

## 2020-12-08 ENCOUNTER — Other Ambulatory Visit (HOSPITAL_BASED_OUTPATIENT_CLINIC_OR_DEPARTMENT_OTHER): Payer: Self-pay

## 2020-12-08 DIAGNOSIS — Z23 Encounter for immunization: Secondary | ICD-10-CM | POA: Diagnosis not present

## 2020-12-08 MED ORDER — COVID-19 MRNA VAC-TRIS(PFIZER) 30 MCG/0.3ML IM SUSP
INTRAMUSCULAR | 0 refills | Status: DC
  Filled 2020-12-08: qty 0.3, 1d supply, fill #0

## 2020-12-11 DIAGNOSIS — U071 COVID-19: Secondary | ICD-10-CM | POA: Diagnosis not present

## 2020-12-26 ENCOUNTER — Telehealth: Payer: Self-pay | Admitting: Pharmacist

## 2020-12-26 NOTE — Progress Notes (Signed)
    Chronic Care Management Pharmacy Assistant   Name: Dawn [name]  MRN: [id number] DOB: 22-Oct-1950   Reason for Encounter: Disease State   Conditions to be addressed/monitored: HTN   Recent office visits:  None ID  Recent consult visits:  None ID  Hospital visits:  None in previous 6 months  Medications: Outpatient Encounter Medications as of 12/26/2020  Medication Sig   ALPRAZolam (XANAX) 0.5 MG tablet Take 1 tablet (0.5 mg total) by mouth 2 (two) times daily as needed for anxiety.   COVID-19 mRNA Vac-TriS, Pfizer, SUSP injection Inject into the muscle.   losartan (COZAAR) 100 MG tablet TAKE 1 TABLET BY MOUTH EVERY DAY   simvastatin (ZOCOR) 20 MG tablet Take 1 tablet (20 mg total) by mouth at bedtime.   No facility-administered encounter medications on file as of 12/26/2020.      Recent Office Vitals: BP Readings from Last 3 Encounters:  07/25/20 136/84  08/26/19 (!) 148/88  07/24/18 121/75   Pulse Readings from Last 3 Encounters:  07/25/20 68  08/26/19 73  07/24/18 62    Wt Readings from Last 3 Encounters:  07/25/20 224 lb (101.6 kg)  08/26/19 214 lb (97.1 kg)  07/23/18 218 lb 1.6 oz (98.9 kg)     Kidney Function Lab Results  Component Value Date/Time   CREATININE 0.67 07/25/2020 02:08 PM   CREATININE 0.77 08/26/2019 04:21 PM   GFR 89.16 07/25/2020 02:08 PM   GFRNONAA >60 07/24/2018 05:18 AM   GFRAA >60 07/24/2018 05:18 AM    BMP Latest Ref Rng & Units 07/25/2020 08/26/2019 07/24/2018  Glucose 70 - 99 mg/dL 91 89 117(H)  BUN 6 - 23 mg/dL 22 28(H) 36(H)  Creatinine 0.40 - 1.20 mg/dL 0.67 0.77 0.92  Sodium 135 - 145 mEq/L 140 141 138  Potassium 3.5 - 5.1 mEq/L 4.4 4.8 4.4  Chloride 96 - 112 mEq/L 105 105 106  CO2 19 - 32 mEq/L '28 27 22  '$ Calcium 8.4 - 10.5 mg/dL 9.4 9.7 8.7(L)   Pharmacist Review  Contacted patient on 12/26/20 to discuss hypertension disease state Contacted patient on 01/01/21 to discuss hypertension disease state Contacted  patient on 01/02/21 to discuss hypertension disease state Left messages for patient to return call, unable to reach patient  Star Rating Drugs:  Medication:  Last Fill: Day Supply Losartan 100 mg 12/04/20  30 Simvastatin 20 mg 12/11/20  30   No appointments scheduled within the next 30 days.    [place] Pharmacist Assistant [phone]   Time spent:16

## 2021-01-22 ENCOUNTER — Other Ambulatory Visit (HOSPITAL_BASED_OUTPATIENT_CLINIC_OR_DEPARTMENT_OTHER): Payer: Self-pay

## 2021-02-16 ENCOUNTER — Telehealth: Payer: Self-pay | Admitting: Pharmacist

## 2021-02-16 NOTE — Progress Notes (Signed)
    Chronic Care Management Pharmacy Assistant   Name: Dawn [name]  MRN: [id number] DOB: [birth year]   Reason for Encounter: Disease State   Conditions to be addressed/monitored: HTN   Recent office visits:  None ID  Recent consult visits:  None ID  Hospital visits:  None since last coordination call  Medications: Outpatient Encounter Medications as of 02/16/2021  Medication Sig   ALPRAZolam (XANAX) 0.5 MG tablet Take 1 tablet (0.5 mg total) by mouth 2 (two) times daily as needed for anxiety.   COVID-19 mRNA Vac-TriS, Pfizer, SUSP injection Inject into the muscle.   losartan (COZAAR) 100 MG tablet TAKE 1 TABLET BY MOUTH EVERY DAY   simvastatin (ZOCOR) 20 MG tablet Take 1 tablet (20 mg total) by mouth at bedtime.   No facility-administered encounter medications on file as of 02/16/2021.   Reviewed chart prior to disease state call. Spoke with patient regarding BP  Recent Office Vitals: BP Readings from Last 3 Encounters:  07/25/20 136/84  08/26/19 (!) 148/88  07/24/18 121/75   Pulse Readings from Last 3 Encounters:  07/25/20 68  08/26/19 73  07/24/18 62    Wt Readings from Last 3 Encounters:  07/25/20 224 lb (101.6 kg)  08/26/19 214 lb (97.1 kg)  07/23/18 218 lb 1.6 oz (98.9 kg)     Kidney Function Lab Results  Component Value Date/Time   CREATININE 0.67 07/25/2020 02:08 PM   CREATININE 0.77 08/26/2019 04:21 PM   GFR 89.16 07/25/2020 02:08 PM   GFRNONAA >60 07/24/2018 05:18 AM   GFRAA >60 07/24/2018 05:18 AM    BMP Latest Ref Rng & Units 07/25/2020 08/26/2019 07/24/2018  Glucose 70 - 99 mg/dL 91 89 117(H)  BUN 6 - 23 mg/dL 22 28(H) 36(H)  Creatinine 0.40 - 1.20 mg/dL 0.67 0.77 0.92  Sodium 135 - 145 mEq/L 140 141 138  Potassium 3.5 - 5.1 mEq/L 4.4 4.8 4.4  Chloride 96 - 112 mEq/L 105 105 106  CO2 19 - 32 mEq/L 28 27 22   Calcium 8.4 - 10.5 mg/dL 9.4 9.7 8.7(L)    3 attempts to contact patient to discuss hypertension disease state Left messages for  patient to return call, unable to reach patient  Current antihypertensive regimen:  Losartan 100 mg daily- last fill 02/08/21 30 ds   What recent interventions/DTPs have been made by any provider to improve Blood Pressure control since last CPP Visit: none noted  Any recent hospitalizations or ED visits since last visit with CPP? No, not since last coordination call   Adherence Review: Is the patient currently on ACE/ARB medication? Yes Does the patient have >5 day gap between last estimated fill dates? No  Star Rating Drugs:  Medication:                Last Fill:         Day Supply Losartan 100 mg         02/08/21            30 Simvastatin 20 mg      12/11/20              [place] Clinical Pharmacist Assistant [phone]

## 2021-03-08 ENCOUNTER — Ambulatory Visit (INDEPENDENT_AMBULATORY_CARE_PROVIDER_SITE_OTHER): Payer: Medicare Other

## 2021-03-08 DIAGNOSIS — Z23 Encounter for immunization: Secondary | ICD-10-CM | POA: Diagnosis not present

## 2021-03-08 NOTE — Progress Notes (Signed)
Pt given HD vacc w/o any complications.

## 2021-03-22 DIAGNOSIS — L918 Other hypertrophic disorders of the skin: Secondary | ICD-10-CM | POA: Diagnosis not present

## 2021-03-22 DIAGNOSIS — Z85828 Personal history of other malignant neoplasm of skin: Secondary | ICD-10-CM | POA: Diagnosis not present

## 2021-03-22 DIAGNOSIS — L570 Actinic keratosis: Secondary | ICD-10-CM | POA: Diagnosis not present

## 2021-03-22 DIAGNOSIS — D225 Melanocytic nevi of trunk: Secondary | ICD-10-CM | POA: Diagnosis not present

## 2021-03-22 DIAGNOSIS — L821 Other seborrheic keratosis: Secondary | ICD-10-CM | POA: Diagnosis not present

## 2021-03-22 DIAGNOSIS — L814 Other melanin hyperpigmentation: Secondary | ICD-10-CM | POA: Diagnosis not present

## 2021-04-12 ENCOUNTER — Telehealth: Payer: Self-pay

## 2021-04-12 NOTE — Progress Notes (Signed)
    Chronic Care Management Pharmacy Assistant   Name: Dawn [name]  MRN: [id number] DOB: 31-May-1950  Reason for Encounter: Disease State-Hypertension Call    Recent office visits:  None ID  Recent consult visits:  None ID  Hospital visits:  None in previous 6 months  Medications: Outpatient Encounter Medications as of 04/12/2021  Medication Sig   ALPRAZolam (XANAX) 0.5 MG tablet Take 1 tablet (0.5 mg total) by mouth 2 (two) times daily as needed for anxiety.   COVID-19 mRNA Vac-TriS, Pfizer, SUSP injection Inject into the muscle.   losartan (COZAAR) 100 MG tablet TAKE 1 TABLET BY MOUTH EVERY DAY   simvastatin (ZOCOR) 20 MG tablet Take 1 tablet (20 mg total) by mouth at bedtime.   No facility-administered encounter medications on file as of 04/12/2021.   Reviewed chart prior to disease state call. Spoke with patient regarding BP  Recent Office Vitals: BP Readings from Last 3 Encounters:  07/25/20 136/84  08/26/19 (!) 148/88  07/24/18 121/75   Pulse Readings from Last 3 Encounters:  07/25/20 68  08/26/19 73  07/24/18 62    Wt Readings from Last 3 Encounters:  07/25/20 224 lb (101.6 kg)  08/26/19 214 lb (97.1 kg)  07/23/18 218 lb 1.6 oz (98.9 kg)     Kidney Function Lab Results  Component Value Date/Time   CREATININE 0.67 07/25/2020 02:08 PM   CREATININE 0.77 08/26/2019 04:21 PM   GFR 89.16 07/25/2020 02:08 PM   GFRNONAA >60 07/24/2018 05:18 AM   GFRAA >60 07/24/2018 05:18 AM    BMP Latest Ref Rng & Units 07/25/2020 08/26/2019 07/24/2018  Glucose 70 - 99 mg/dL 91 89 117(H)  BUN 6 - 23 mg/dL 22 28(H) 36(H)  Creatinine 0.40 - 1.20 mg/dL 0.67 0.77 0.92  Sodium 135 - 145 mEq/L 140 141 138  Potassium 3.5 - 5.1 mEq/L 4.4 4.8 4.4  Chloride 96 - 112 mEq/L 105 105 106  CO2 19 - 32 mEq/L 28 27 22   Calcium 8.4 - 10.5 mg/dL 9.4 9.7 8.7(L)    Current antihypertensive regimen:  Losartan 100 mg 1 tab daily  How often are you checking your Blood Pressure?  Patient  states she does not check blood pressure  Current home BP readings: NA  What recent interventions/DTPs have been made by any provider to improve Blood Pressure control since last CPP Visit: none noted  Any recent hospitalizations or ED visits since last visit with CPP? No  What diet changes have been made to improve Blood Pressure Control?  Patient stated that she has not made any new changes to diet  What exercise is being done to improve your Blood Pressure Control?  Patient states that she is not exercising like she should  Adherence Review: Is the patient currently on ACE/ARB medication? Yes Does the patient have >5 day gap between last estimated fill dates? No    Care Gaps: Colonoscopy-08/20/16 Diabetic Foot Exam-NA Mammogram-08/09/20 Ophthalmology-NA Dexa Scan - NA Annual Well Visit - NA Micro albumin-NA Hemoglobin A1c- NA  Star Rating Drugs: Losartan 100 mg-last fill  03/31/21 30 ds Simvastatin 20 mg-last fill 03/04/21 30 ds       [place] Pharmacist Assistant [phone]

## 2021-06-19 ENCOUNTER — Other Ambulatory Visit: Payer: Self-pay | Admitting: Internal Medicine

## 2021-06-19 DIAGNOSIS — E785 Hyperlipidemia, unspecified: Secondary | ICD-10-CM

## 2021-07-15 ENCOUNTER — Other Ambulatory Visit: Payer: Self-pay | Admitting: Internal Medicine

## 2021-07-15 DIAGNOSIS — I10 Essential (primary) hypertension: Secondary | ICD-10-CM

## 2021-08-05 ENCOUNTER — Other Ambulatory Visit: Payer: Self-pay | Admitting: Internal Medicine

## 2021-08-05 DIAGNOSIS — E785 Hyperlipidemia, unspecified: Secondary | ICD-10-CM

## 2021-08-09 ENCOUNTER — Other Ambulatory Visit: Payer: Self-pay | Admitting: Internal Medicine

## 2021-08-09 DIAGNOSIS — I10 Essential (primary) hypertension: Secondary | ICD-10-CM

## 2021-08-24 ENCOUNTER — Other Ambulatory Visit: Payer: Self-pay | Admitting: Internal Medicine

## 2021-08-24 DIAGNOSIS — E785 Hyperlipidemia, unspecified: Secondary | ICD-10-CM

## 2021-08-31 DIAGNOSIS — U071 COVID-19: Secondary | ICD-10-CM | POA: Diagnosis not present

## 2021-09-10 ENCOUNTER — Other Ambulatory Visit: Payer: Self-pay | Admitting: Internal Medicine

## 2021-09-10 DIAGNOSIS — E785 Hyperlipidemia, unspecified: Secondary | ICD-10-CM

## 2021-09-20 ENCOUNTER — Other Ambulatory Visit: Payer: Self-pay | Admitting: Internal Medicine

## 2021-09-20 DIAGNOSIS — U071 COVID-19: Secondary | ICD-10-CM | POA: Diagnosis not present

## 2021-09-20 DIAGNOSIS — I10 Essential (primary) hypertension: Secondary | ICD-10-CM

## 2021-09-25 ENCOUNTER — Encounter: Payer: Self-pay | Admitting: Gastroenterology

## 2021-10-02 ENCOUNTER — Telehealth: Payer: Self-pay

## 2021-10-02 NOTE — Progress Notes (Signed)
Chronic Care Management Pharmacy Assistant   Name: Dawn [name]  MRN: [id number] DOB: 06/10/1950   Reason for Encounter: Disease State-General    Recent office visits:  None since last coordination call 02/16/21  Recent consult visits:  None sicne last coordination call 02/16/21  Hospital visits:  None in previous 6 months  Medications: Outpatient Encounter Medications as of 10/02/2021  Medication Sig   ALPRAZolam (XANAX) 0.5 MG tablet Take 1 tablet (0.5 mg total) by mouth 2 (two) times daily as needed for anxiety.   COVID-19 mRNA Vac-TriS, Pfizer, SUSP injection Inject into the muscle.   losartan (COZAAR) 100 MG tablet TAKE 1 TABLET BY MOUTH EVERY DAY   simvastatin (ZOCOR) 20 MG tablet Take 1 tablet (20 mg total) by mouth at bedtime.   No facility-administered encounter medications on file as of 10/02/2021.   Contacted [facility] for General Review Call   Chart Review:  Have there been any documented new, changed, or discontinued medications since last visit? No (If yes, include name, dose, frequency, date) Has there been any documented recent hospitalizations or ED visits since last visit with Clinical Pharmacist? No Brief Summary (including medication and/or Diagnosis changes):   Adherence Review:  Does the Clinical Pharmacist Assistant have access to adherence rates? Yes Adherence rates for STAR metric medications (List medication(s)/day supply/ last 2 fill dates). Adherence rates for medications indicated for disease state being reviewed (List medication(s)/day supply/ last 2 fill dates). Does the patient have >5 day gap between last estimated fill dates for any of the above medications or other medication gaps? Yes Reason for medication gaps.   Disease State Questions:  Able to connect with Patient? Yes  Did patient have any problems with their health recently? No Note problems and Concerns:  Have you had any admissions or emergency room  visits or worsening of your condition(s) since last visit? No Details of ED visit, hospital visit and/or worsening condition(s):  Have you had any visits with new specialists or providers since your last visit? No Explain:  Have you had any new health care problem(s) since your last visit? No New problem(s) reported:  Have you run out of any of your medications since you last spoke with clinical pharmacist? No What caused you to run out of your medications?  Are there any medications you are not taking as prescribed? No What kept you from taking your medications as prescribed? Are you having any issues or side effects with your medications? No Note of issues or side effects:  Do you have any other health concerns or questions you want to discuss with your Clinical Pharmacist before your next visit? No Note additional concerns and questions from Patient.  Are there any health concerns that you feel we can do a better job addressing? No Note Patient's response.  Are you having any problems with any of the following since the last visit: (select all that apply)  None  Details:  12. Any falls since last visit? No  Details:  13. Any increased or uncontrolled pain since last visit? No  Details:  14. Next visit Type: None does not want at this time        15. Additional Details? No    Care Gaps: Colonoscopy-08/20/16 Diabetic Foot Exam-NA Mammogram-08/09/20 Ophthalmology-NA Dexa Scan - NA Annual Well Visit -  Micro albumin-NA Hemoglobin A1c- 06/11/18  Star Rating Drugs: Losartan 100 mg-last fill 07/12/21 30 ds Simvastatin 20 mg-last fill 07/12/21 ds  [facility] Clinical  Pharmacist Assistant [phone]

## 2021-11-28 ENCOUNTER — Other Ambulatory Visit: Payer: Self-pay | Admitting: Internal Medicine

## 2021-11-28 DIAGNOSIS — I10 Essential (primary) hypertension: Secondary | ICD-10-CM

## 2021-11-28 DIAGNOSIS — E785 Hyperlipidemia, unspecified: Secondary | ICD-10-CM

## 2021-12-04 ENCOUNTER — Telehealth: Payer: Self-pay | Admitting: Internal Medicine

## 2021-12-04 ENCOUNTER — Other Ambulatory Visit: Payer: Self-pay | Admitting: Internal Medicine

## 2021-12-04 DIAGNOSIS — Z1231 Encounter for screening mammogram for malignant neoplasm of breast: Secondary | ICD-10-CM

## 2021-12-04 NOTE — Telephone Encounter (Signed)
Patient called to schedule her mammogram with [facility] - patient told them that she is having some breast tenderness.  Patient was told that Dr. Ronnald Ramp would need to order a diagnostic mammogram to be performed.  Please advise.

## 2021-12-06 ENCOUNTER — Other Ambulatory Visit: Payer: Self-pay | Admitting: Internal Medicine

## 2021-12-06 DIAGNOSIS — I10 Essential (primary) hypertension: Secondary | ICD-10-CM

## 2021-12-06 DIAGNOSIS — E785 Hyperlipidemia, unspecified: Secondary | ICD-10-CM

## 2021-12-13 ENCOUNTER — Encounter: Payer: Self-pay | Admitting: Internal Medicine

## 2021-12-13 ENCOUNTER — Ambulatory Visit (INDEPENDENT_AMBULATORY_CARE_PROVIDER_SITE_OTHER): Payer: Medicare Other | Admitting: Internal Medicine

## 2021-12-13 VITALS — BP 124/70 | HR 92 | Resp 18 | Ht 68.0 in | Wt 229.2 lb

## 2021-12-13 DIAGNOSIS — N644 Mastodynia: Secondary | ICD-10-CM | POA: Insufficient documentation

## 2021-12-13 DIAGNOSIS — I10 Essential (primary) hypertension: Secondary | ICD-10-CM | POA: Diagnosis not present

## 2021-12-13 DIAGNOSIS — R7989 Other specified abnormal findings of blood chemistry: Secondary | ICD-10-CM

## 2021-12-13 DIAGNOSIS — Z0001 Encounter for general adult medical examination with abnormal findings: Secondary | ICD-10-CM

## 2021-12-13 DIAGNOSIS — E785 Hyperlipidemia, unspecified: Secondary | ICD-10-CM | POA: Diagnosis not present

## 2021-12-13 DIAGNOSIS — E2839 Other primary ovarian failure: Secondary | ICD-10-CM

## 2021-12-13 DIAGNOSIS — Z0000 Encounter for general adult medical examination without abnormal findings: Secondary | ICD-10-CM

## 2021-12-13 LAB — CBC
HCT: 43 % (ref 36.0–46.0)
Hemoglobin: 14.5 g/dL (ref 12.0–15.0)
MCHC: 33.7 g/dL (ref 30.0–36.0)
MCV: 96 fl (ref 78.0–100.0)
Platelets: 251 10*3/uL (ref 150.0–400.0)
RBC: 4.47 Mil/uL (ref 3.87–5.11)
RDW: 13.2 % (ref 11.5–15.5)
WBC: 5.3 10*3/uL (ref 4.0–10.5)

## 2021-12-13 LAB — COMPREHENSIVE METABOLIC PANEL
ALT: 38 U/L — ABNORMAL HIGH (ref 0–35)
AST: 28 U/L (ref 0–37)
Albumin: 4.3 g/dL (ref 3.5–5.2)
Alkaline Phosphatase: 64 U/L (ref 39–117)
BUN: 25 mg/dL — ABNORMAL HIGH (ref 6–23)
CO2: 30 mEq/L (ref 19–32)
Calcium: 9.3 mg/dL (ref 8.4–10.5)
Chloride: 106 mEq/L (ref 96–112)
Creatinine, Ser: 0.86 mg/dL (ref 0.40–1.20)
GFR: 68.25 mL/min (ref >60.00)
Glucose, Bld: 102 mg/dL — ABNORMAL HIGH (ref 70–99)
Potassium: 4.3 mEq/L (ref 3.5–5.1)
Sodium: 142 mEq/L (ref 135–145)
Total Bilirubin: 0.4 mg/dL (ref 0.2–1.2)
Total Protein: 7.3 g/dL (ref 6.0–8.3)

## 2021-12-13 LAB — LIPID PANEL
Cholesterol: 220 mg/dL — ABNORMAL HIGH (ref 0–200)
HDL: 69.1 mg/dL (ref >39.00)
LDL Cholesterol: 132 mg/dL — ABNORMAL HIGH (ref 0–99)
NonHDL: 150.42
Total CHOL/HDL Ratio: 3
Triglycerides: 92 mg/dL (ref 0.0–149.0)
VLDL: 18.4 mg/dL (ref 0.0–40.0)

## 2021-12-13 MED ORDER — SIMVASTATIN 20 MG PO TABS: 20.0000 mg | ORAL_TABLET | Freq: Every day | ORAL | 3 refills | Status: DC

## 2021-12-13 MED ORDER — LOSARTAN POTASSIUM 100 MG PO TABS: 100.0000 mg | ORAL_TABLET | Freq: Every day | ORAL | 3 refills | Status: DC

## 2021-12-13 NOTE — Assessment & Plan Note (Signed)
Checking CMP for stability.  

## 2021-12-13 NOTE — Assessment & Plan Note (Signed)
Checking CMP and CBC. BP at goal. Refilled losartan 100 mg daily will adjust if needed.

## 2021-12-13 NOTE — Assessment & Plan Note (Signed)
Flu shot yearly. Covid-19 counseled. Pneumonia complete. Shingrix due at pharmacy. Tetanus due later 2023. Colonoscopy due she has been called to schedule and will do so. Mammogram ordered, pap smear aged out and dexa ordered today. Counseled about sun safety and mole surveillance. Counseled about the dangers of distracted driving. Given 10 year screening recommendations.

## 2021-12-13 NOTE — Assessment & Plan Note (Signed)
Suspect related to recent water aerobics start as she is having almost near resolution now. Ordered diagnostic mammogram as she is due and breast center will do not screening due to complaint of breast pain. Advised patient if normal she will return to screening mammogram next year.
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
## 2021-12-13 NOTE — Assessment & Plan Note (Signed)
Checking lipid panel and adjust simvastatin 20 mg daily as needed. Refilled today.

## 2021-12-18 ENCOUNTER — Other Ambulatory Visit: Payer: Self-pay | Admitting: Internal Medicine

## 2021-12-18 DIAGNOSIS — N644 Mastodynia: Secondary | ICD-10-CM

## 2021-12-20 ENCOUNTER — Telehealth: Payer: Self-pay | Admitting: Internal Medicine

## 2021-12-20 NOTE — Telephone Encounter (Signed)
Dawn [name] from the [facility] called and stated they need Dr. Sharlet Salina to sign off on the pt so that they can see her for her appointment 12/21/21.  Please advise   She said she can be reached at [phone] with any questions.

## 2021-12-21 ENCOUNTER — Other Ambulatory Visit: Payer: Self-pay | Admitting: Internal Medicine

## 2021-12-21 ENCOUNTER — Ambulatory Visit: Admission: RE | Admit: 2021-12-21 | Payer: Medicare Other | Source: Ambulatory Visit

## 2021-12-21 ENCOUNTER — Ambulatory Visit
Admission: RE | Admit: 2021-12-21 | Discharge: 2021-12-21 | Disposition: A | Discharge status: Home / Self Care | Payer: Medicare Other | Source: Ambulatory Visit | Attending: Internal Medicine | Admitting: Internal Medicine

## 2021-12-21 ENCOUNTER — Encounter: Payer: Self-pay | Admitting: Internal Medicine

## 2021-12-21 DIAGNOSIS — N644 Mastodynia: Secondary | ICD-10-CM

## 2021-12-21 DIAGNOSIS — R928 Other abnormal and inconclusive findings on diagnostic imaging of breast: Secondary | ICD-10-CM | POA: Diagnosis not present

## 2022-01-03 ENCOUNTER — Encounter: Payer: Self-pay | Admitting: Gastroenterology

## 2022-01-17 DIAGNOSIS — Z01419 Encounter for gynecological examination (general) (routine) without abnormal findings: Secondary | ICD-10-CM | POA: Diagnosis not present

## 2022-01-17 DIAGNOSIS — Z124 Encounter for screening for malignant neoplasm of cervix: Secondary | ICD-10-CM | POA: Diagnosis not present

## 2022-01-17 DIAGNOSIS — Z01411 Encounter for gynecological examination (general) (routine) with abnormal findings: Secondary | ICD-10-CM | POA: Diagnosis not present

## 2022-01-17 DIAGNOSIS — Z0142 Encounter for cervical smear to confirm findings of recent normal smear following initial abnormal smear: Secondary | ICD-10-CM | POA: Diagnosis not present

## 2022-01-31 DIAGNOSIS — H25813 Combined forms of age-related cataract, bilateral: Secondary | ICD-10-CM | POA: Diagnosis not present

## 2022-02-07 ENCOUNTER — Ambulatory Visit (AMBULATORY_SURGERY_CENTER): Payer: Self-pay | Admitting: *Deleted

## 2022-02-07 VITALS — Ht 68.0 in | Wt 232.0 lb

## 2022-02-07 DIAGNOSIS — Z86010 Personal history of colonic polyps: Secondary | ICD-10-CM

## 2022-02-07 MED ORDER — NA SULFATE-K SULFATE-MG SULF 17.5-3.13-1.6 GM/177ML PO SOLN: 1.0000 | Freq: Once | ORAL | 0 refills | Status: AC

## 2022-02-07 NOTE — Progress Notes (Signed)
No egg or soy allergy known to patient  No issues known to pt with past sedation with any surgeries or procedures Patient denies ever being told they had issues or difficulty with intubation  No FH of Malignant Hyperthermia Pt is not on diet pills Pt is not on home 02  Pt is not on blood thinners  Pt denies issues with constipation  No A fib or A flutter Have any cardiac testing pending--NO Pt instructed to use Singlecare.com or GoodRx for a price reduction on prep   

## 2022-02-22 ENCOUNTER — Encounter: Payer: Self-pay | Admitting: Gastroenterology

## 2022-02-22 ENCOUNTER — Ambulatory Visit (AMBULATORY_SURGERY_CENTER): Payer: Medicare Other | Admitting: Gastroenterology

## 2022-02-22 VITALS — BP 132/84 | HR 68 | Temp 98.2°F | Resp 10 | Ht 68.0 in | Wt 232.0 lb

## 2022-02-22 DIAGNOSIS — Z09 Encounter for follow-up examination after completed treatment for conditions other than malignant neoplasm: Secondary | ICD-10-CM | POA: Diagnosis not present

## 2022-02-22 DIAGNOSIS — D123 Benign neoplasm of transverse colon: Secondary | ICD-10-CM

## 2022-02-22 DIAGNOSIS — Z86010 Personal history of colonic polyps: Secondary | ICD-10-CM | POA: Diagnosis not present

## 2022-02-22 DIAGNOSIS — D122 Benign neoplasm of ascending colon: Secondary | ICD-10-CM | POA: Diagnosis not present

## 2022-02-22 DIAGNOSIS — I10 Essential (primary) hypertension: Secondary | ICD-10-CM | POA: Diagnosis not present

## 2022-02-22 MED ORDER — SODIUM CHLORIDE 0.9 % IV SOLN: 500.0000 mL | Freq: Once | INTRAVENOUS | Status: DC
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
## 2022-02-22 NOTE — Op Note (Signed)
[place] Patient Name: Dawn [name] Procedure Date: 02/22/2022 9:02 AM MRN: [id number] Endoscopist: Mallie Mussel L. Loletha Carrow , MD Age: 71 Referring MD:  Date of Birth: Oct 11, 1950 Gender: Female Account #: [id number] Procedure:                Colonoscopy Indications:              Surveillance: Personal history of adenomatous                            polyps on last colonoscopy > 5 years ago                           Diminutive TA April 2018; <63m TA in 2012 Medicines:                Monitored Anesthesia Care Procedure:                Pre-Anesthesia Assessment:                           - Prior to the procedure, a History and Physical                            was performed, and patient medications and                            allergies were reviewed. The patient's tolerance of                            previous anesthesia was also reviewed. The risks                            and benefits of the procedure and the sedation                            options and risks were discussed with the patient.                            All questions were answered, and informed consent                            was obtained. Prior Anticoagulants: The patient has                            taken no previous anticoagulant or antiplatelet                            agents. ASA Grade Assessment: II - A patient with                            mild systemic disease. After reviewing the risks                            and benefits, the patient was deemed in  satisfactory condition to undergo the procedure.                           After obtaining informed consent, the colonoscope                            was passed under direct vision. Throughout the                            procedure, the patient's blood pressure, pulse, and                            oxygen saturations were monitored continuously. The                            Olympus CF-HQ190L  ([id number]) Colonoscope was                            introduced through the anus and advanced to the the                            cecum, identified by appendiceal orifice and                            ileocecal valve. The colonoscopy was somewhat                            difficult due to multiple diverticula in the colon                            and a tortuous colon. Successful completion of the                            procedure was aided by using manual pressure,                            straightening and shortening the scope to obtain                            bowel loop reduction and lavage. The patient                            tolerated the procedure well. The quality of the                            bowel preparation was good. The ileocecal valve,                            appendiceal orifice, and rectum were photographed. Scope In: 9:09:06 AM Scope Out: 9:28:59 AM Scope Withdrawal Time: 0 hours 13 minutes 57 seconds  Total Procedure Duration: 0 hours 19 minutes 53 seconds  Findings:                 The perianal and digital rectal examinations were  normal.                           Repeat examination of right colon under NBI                            performed.                           Many diverticula were found in the left colon.                           Three flat, sessile and semi-sessile polyps were                            found in the proximal transverse colon and                            ascending colon. The polyps were 4 to 6 mm in size.                            These polyps were removed with a cold snare.                            Resection and retrieval were complete.                           The exam was otherwise without abnormality on                            direct and retroflexion views. Complications:            No immediate complications. Estimated Blood Loss:     Estimated blood loss was  minimal. Impression:               - Diverticulosis in the left colon.                           - Three 4 to 6 mm polyps in the proximal transverse                            colon and in the ascending colon, removed with a                            cold snare. Resected and retrieved.                           - The examination was otherwise normal on direct                            and retroflexion views. Recommendation:           - Patient has a contact number available for                            emergencies. The signs and  symptoms of potential                            delayed complications were discussed with the                            patient. Return to normal activities tomorrow.                            Written discharge instructions were provided to the                            patient.                           - Resume previous diet.                           - Continue present medications.                           - Await pathology results.                           - Repeat colonoscopy may be recommended for                            surveillance. That will be determined after                            pathology results from today's exam become                            available for review. Indiana Pechacek L. Loletha Carrow, MD 02/22/2022 9:39:23 AM This report has been signed electronically.

## 2022-02-22 NOTE — Progress Notes (Signed)
Pt's states no medical or surgical changes since previsit or office visit. 

## 2022-02-22 NOTE — Progress Notes (Signed)
Called to room to assist during endoscopic procedure.  Patient ID and intended procedure confirmed with present staff. Received instructions for my participation in the procedure from the performing physician.  

## 2022-02-22 NOTE — Patient Instructions (Addendum)
Handouts given on polyps and diverticulosis.    YOU HAD AN ENDOSCOPIC PROCEDURE TODAY AT THE [facility]:   Refer to the procedure report that was given to you for any specific questions about what was found during the examination.  If the procedure report does not answer your questions, please call your gastroenterologist to clarify.  If you requested that your care partner not be given the details of your procedure findings, then the procedure report has been included in a sealed envelope for you to review at your convenience later.  YOU SHOULD EXPECT: Some feelings of bloating in the abdomen. Passage of more gas than usual.  Walking can help get rid of the air that was put into your GI tract during the procedure and reduce the bloating. If you had a lower endoscopy (such as a colonoscopy or flexible sigmoidoscopy) you may notice spotting of blood in your stool or on the toilet paper. If you underwent a bowel prep for your procedure, you may not have a normal bowel movement for a few days.  Please Note:  You might notice some irritation and congestion in your nose or some drainage.  This is from the oxygen used during your procedure.  There is no need for concern and it should clear up in a day or so.  SYMPTOMS TO REPORT IMMEDIATELY:  Following lower endoscopy (colonoscopy or flexible sigmoidoscopy):  Excessive amounts of blood in the stool  Significant tenderness or worsening of abdominal pains  Swelling of the abdomen that is new, acute  Fever of 100F or higher   For urgent or emergent issues, a gastroenterologist can be reached at any hour by calling [phone]. Do not use MyChart messaging for urgent concerns.    DIET:  We do recommend a small meal at first, but then you may proceed to your regular diet.  Drink plenty of fluids but you should avoid alcoholic beverages for 24 hours.  ACTIVITY:  You should plan to take it easy for the rest of today and you should NOT  DRIVE or use heavy machinery until tomorrow (because of the sedation medicines used during the test).    FOLLOW UP: Our staff will call the number listed on your records the next business day following your procedure.  We will call around 7:15- 8:00 am to check on you and address any questions or concerns that you may have regarding the information given to you following your procedure. If we do not reach you, we will leave a message.     If any biopsies were taken you will be contacted by phone or by letter within the next 1-3 weeks.  Please call us at [phone] if you have not heard about the biopsies in 3 weeks.    SIGNATURES/CONFIDENTIALITY: You and/or your care partner have signed paperwork which will be entered into your electronic medical record.  These signatures attest to the fact that that the information above on your After Visit Summary has been reviewed and is understood.  Full responsibility of the confidentiality of this discharge information lies with you and/or your care-partner. 

## 2022-02-22 NOTE — Progress Notes (Signed)
Report to PACU, RN, vss, BBS= Clear.  

## 2022-02-25 ENCOUNTER — Telehealth: Payer: Self-pay

## 2022-02-25 NOTE — Telephone Encounter (Signed)
  Follow up Call-     02/22/2022    8:50 AM  Call back number  Post procedure Call Back phone  # [phone]  Permission to leave phone message Yes     Patient questions:  Do you have a fever, pain , or abdominal swelling? No. Pain Score  0 *  Have you tolerated food without any problems? Yes.    Have you been able to return to your normal activities? Yes.    Do you have any questions about your discharge instructions: Diet   No. Medications  No. Follow up visit  No.  Do you have questions or concerns about your Care? No.  Actions: * If pain score is 4 or above: No action needed, pain <4.

## 2022-02-26 ENCOUNTER — Encounter: Payer: Self-pay | Admitting: Gastroenterology

## 2022-03-14 ENCOUNTER — Ambulatory Visit: Payer: Medicare Other | Admitting: *Deleted

## 2022-03-14 NOTE — Progress Notes (Signed)
Administered high dose flu shot right deltoid. Pt tolerated well.

## 2022-03-28 DIAGNOSIS — L578 Other skin changes due to chronic exposure to nonionizing radiation: Secondary | ICD-10-CM | POA: Diagnosis not present

## 2022-03-28 DIAGNOSIS — L918 Other hypertrophic disorders of the skin: Secondary | ICD-10-CM | POA: Diagnosis not present

## 2022-03-28 DIAGNOSIS — L821 Other seborrheic keratosis: Secondary | ICD-10-CM | POA: Diagnosis not present

## 2022-03-28 DIAGNOSIS — Z85828 Personal history of other malignant neoplasm of skin: Secondary | ICD-10-CM | POA: Diagnosis not present

## 2022-03-28 DIAGNOSIS — L570 Actinic keratosis: Secondary | ICD-10-CM | POA: Diagnosis not present

## 2022-03-28 DIAGNOSIS — L988 Other specified disorders of the skin and subcutaneous tissue: Secondary | ICD-10-CM | POA: Diagnosis not present

## 2022-03-28 DIAGNOSIS — D485 Neoplasm of uncertain behavior of skin: Secondary | ICD-10-CM | POA: Diagnosis not present

## 2022-06-05 ENCOUNTER — Telehealth: Payer: Self-pay | Admitting: Internal Medicine

## 2022-06-05 NOTE — Telephone Encounter (Signed)
PT calls today in regards to potential RX for sea sickness patches. PT could not specify exactly what brand or type of patch but was recommended by a friend to reach out to PCP. I informed her that an OTC version of the medication might work out as well for them. They are requesting these for their cruise tomorrow. If this can be filled they would like it sent to  [id number] [place] [phone]

## 2022-06-08 ENCOUNTER — Other Ambulatory Visit: Payer: Self-pay | Admitting: Internal Medicine

## 2022-06-08 MED ORDER — SCOPOLAMINE 1 MG/3DAYS TD PT72: 1.0000 | MEDICATED_PATCH | TRANSDERMAL | 0 refills | Status: DC

## 2022-07-04 DIAGNOSIS — L570 Actinic keratosis: Secondary | ICD-10-CM | POA: Diagnosis not present

## 2022-07-04 DIAGNOSIS — Z85828 Personal history of other malignant neoplasm of skin: Secondary | ICD-10-CM | POA: Diagnosis not present

## 2022-07-25 DIAGNOSIS — N950 Postmenopausal bleeding: Secondary | ICD-10-CM | POA: Diagnosis not present

## 2022-08-01 DIAGNOSIS — R109 Unspecified abdominal pain: Secondary | ICD-10-CM | POA: Diagnosis not present

## 2022-08-01 DIAGNOSIS — N858 Other specified noninflammatory disorders of uterus: Secondary | ICD-10-CM | POA: Diagnosis not present

## 2022-08-01 DIAGNOSIS — N950 Postmenopausal bleeding: Secondary | ICD-10-CM | POA: Diagnosis not present

## 2022-08-01 DIAGNOSIS — R9389 Abnormal findings on diagnostic imaging of other specified body structures: Secondary | ICD-10-CM | POA: Diagnosis not present

## 2022-12-16 ENCOUNTER — Other Ambulatory Visit: Payer: Self-pay | Admitting: Internal Medicine

## 2022-12-16 DIAGNOSIS — E785 Hyperlipidemia, unspecified: Secondary | ICD-10-CM

## 2022-12-16 DIAGNOSIS — I10 Essential (primary) hypertension: Secondary | ICD-10-CM

## 2023-02-04 ENCOUNTER — Other Ambulatory Visit: Payer: Self-pay | Admitting: Internal Medicine

## 2023-02-04 DIAGNOSIS — E785 Hyperlipidemia, unspecified: Secondary | ICD-10-CM

## 2023-02-04 DIAGNOSIS — I10 Essential (primary) hypertension: Secondary | ICD-10-CM

## 2023-02-19 ENCOUNTER — Encounter: Payer: Self-pay | Admitting: Internal Medicine

## 2023-02-19 ENCOUNTER — Ambulatory Visit: Payer: Medicare Other | Admitting: Internal Medicine

## 2023-02-19 VITALS — BP 154/90 | HR 71 | Temp 98.2°F | Ht 68.0 in | Wt 220.0 lb

## 2023-02-19 DIAGNOSIS — R9431 Abnormal electrocardiogram [ECG] [EKG]: Secondary | ICD-10-CM

## 2023-02-19 DIAGNOSIS — I10 Essential (primary) hypertension: Secondary | ICD-10-CM

## 2023-02-19 DIAGNOSIS — E2839 Other primary ovarian failure: Secondary | ICD-10-CM | POA: Diagnosis not present

## 2023-02-19 DIAGNOSIS — R7989 Other specified abnormal findings of blood chemistry: Secondary | ICD-10-CM

## 2023-02-19 DIAGNOSIS — Z23 Encounter for immunization: Secondary | ICD-10-CM

## 2023-02-19 DIAGNOSIS — E785 Hyperlipidemia, unspecified: Secondary | ICD-10-CM

## 2023-02-19 LAB — CBC WITH DIFFERENTIAL/PLATELET
Basophils Absolute: 0.1 10*3/uL (ref 0.0–0.1)
Basophils Relative: 2.4 % (ref 0.0–3.0)
Eosinophils Absolute: 0.2 10*3/uL (ref 0.0–0.7)
Eosinophils Relative: 4.2 % (ref 0.0–5.0)
HCT: 41.8 % (ref 36.0–46.0)
Hemoglobin: 14.1 g/dL (ref 12.0–15.0)
Lymphocytes Relative: 40.1 % (ref 12.0–46.0)
Lymphs Abs: 2 10*3/uL (ref 0.7–4.0)
MCHC: 33.6 g/dL (ref 30.0–36.0)
MCV: 93.7 fL (ref 78.0–100.0)
Monocytes Absolute: 0.5 10*3/uL (ref 0.1–1.0)
Monocytes Relative: 9.3 % (ref 3.0–12.0)
Neutro Abs: 2.2 10*3/uL (ref 1.4–7.7)
Neutrophils Relative %: 44 % (ref 43.0–77.0)
Platelets: 229 10*3/uL (ref 150.0–400.0)
RBC: 4.46 Mil/uL (ref 3.87–5.11)
RDW: 13.6 % (ref 11.5–15.5)
WBC: 5 10*3/uL (ref 4.0–10.5)

## 2023-02-19 LAB — URINALYSIS, ROUTINE W REFLEX MICROSCOPIC
Bilirubin Urine: NEGATIVE
Hgb urine dipstick: NEGATIVE
Ketones, ur: NEGATIVE
Leukocytes,Ua: NEGATIVE
Nitrite: NEGATIVE
RBC / HPF: NONE SEEN (ref 0–?)
Specific Gravity, Urine: 1.025 (ref 1.000–1.030)
Total Protein, Urine: NEGATIVE
Urine Glucose: NEGATIVE
Urobilinogen, UA: 0.2 (ref 0.0–1.0)
pH: 6 (ref 5.0–8.0)

## 2023-02-19 LAB — LIPID PANEL
Cholesterol: 203 mg/dL — ABNORMAL HIGH (ref 0–200)
HDL: 72.1 mg/dL (ref >39.00)
LDL Cholesterol: 104 mg/dL — ABNORMAL HIGH (ref 0–99)
NonHDL: 130.88
Total CHOL/HDL Ratio: 3
Triglycerides: 134 mg/dL (ref 0.0–149.0)
VLDL: 26.8 mg/dL (ref 0.0–40.0)

## 2023-02-19 LAB — BASIC METABOLIC PANEL
BUN: 21 mg/dL (ref 6–23)
CO2: 27 meq/L (ref 19–32)
Calcium: 9.5 mg/dL (ref 8.4–10.5)
Chloride: 104 meq/L (ref 96–112)
Creatinine, Ser: 0.91 mg/dL (ref 0.40–1.20)
GFR: 63.24 mL/min (ref >60.00)
Glucose, Bld: 99 mg/dL (ref 70–99)
Potassium: 4.7 meq/L (ref 3.5–5.1)
Sodium: 141 meq/L (ref 135–145)

## 2023-02-19 LAB — HEPATIC FUNCTION PANEL
ALT: 21 U/L (ref 0–35)
AST: 22 U/L (ref 0–37)
Albumin: 4.3 g/dL (ref 3.5–5.2)
Alkaline Phosphatase: 72 U/L (ref 39–117)
Bilirubin, Direct: 0.1 mg/dL (ref 0.0–0.3)
Total Bilirubin: 0.5 mg/dL (ref 0.2–1.2)
Total Protein: 6.6 g/dL (ref 6.0–8.3)

## 2023-02-19 LAB — TROPONIN I (HIGH SENSITIVITY): High Sens Troponin I: 5 ng/L (ref 2–17)

## 2023-02-19 LAB — TSH: TSH: 2.25 u[IU]/mL (ref 0.35–5.50)

## 2023-02-19 MED ORDER — BOOSTRIX 5-2.5-18.5 LF-MCG/0.5 IM SUSP
0.5000 mL | Freq: Once | INTRAMUSCULAR | 0 refills | Status: AC
Start: 2023-02-19 — End: 2023-02-19

## 2023-02-19 MED ORDER — SHINGRIX 50 MCG/0.5ML IM SUSR: 0.5000 mL | Freq: Once | INTRAMUSCULAR | 1 refills | Status: DC

## 2023-02-19 MED ORDER — SIMVASTATIN 20 MG PO TABS
20.0000 mg | ORAL_TABLET | Freq: Every day | ORAL | 0 refills | Status: DC
Start: 2023-02-19 — End: 2023-05-15

## 2023-02-19 MED ORDER — LOSARTAN POTASSIUM 100 MG PO TABS
100.0000 mg | ORAL_TABLET | Freq: Every day | ORAL | 0 refills | Status: DC
Start: 2023-02-19 — End: 2023-05-15

## 2023-02-19 MED ORDER — INDAPAMIDE 1.25 MG PO TABS
1.2500 mg | ORAL_TABLET | Freq: Every day | ORAL | 0 refills | Status: DC
Start: 2023-02-19 — End: 2023-05-20

## 2023-02-19 NOTE — Progress Notes (Signed)
Subjective:  Patient ID: Dawn [name], female    DOB: 1951-04-15  Age: 72 y.o. MRN: [id number]  CC: Hypertension and Hyperlipidemia   HPI Dawn [name] presents for f/up ----  Discussed the use of AI scribe software for clinical note transcription with the patient, who gave verbal consent to proceed.  History of Present Illness   The patient reports feeling well with no complaints of headache, blurred vision, chest pain, or shortness of breath. They have noticed an increase in allergies this fall season. They deny any side effects from their medications, including muscle or joint aches.  The patient remains active, although they admit to a decrease in exercise over the past month. They are considering enrolling in a water aerobics class, as they enjoy pool activities during the summer.       Outpatient Medications Prior to Visit  Medication Sig Dispense Refill   ALPRAZolam (XANAX) 0.5 MG tablet Take 1 tablet (0.5 mg total) by mouth 2 (two) times daily as needed for anxiety. 20 tablet 0   scopolamine (TRANSDERM-SCOP) 1 MG/3DAYS Place 1 patch (1.5 mg total) onto the skin every 3 (three) days. 10 patch 0   losartan (COZAAR) 100 MG tablet TAKE 1 TABLET BY MOUTH EVERY DAY 30 tablet 0   simvastatin (ZOCOR) 20 MG tablet TAKE 1 TABLET BY MOUTH EVERYDAY AT BEDTIME 30 tablet 0   No facility-administered medications prior to visit.    ROS Review of Systems  Constitutional:  Negative for appetite change, diaphoresis, fatigue and unexpected weight change.  HENT: Negative.    Eyes: Negative.   Respiratory:  Negative for cough, chest tightness, shortness of breath and wheezing.   Cardiovascular:  Negative for chest pain, palpitations and leg swelling.  Gastrointestinal:  Negative for abdominal pain, constipation, diarrhea and nausea.  Genitourinary: Negative.  Negative for difficulty urinating.  Musculoskeletal: Negative.  Negative for arthralgias and myalgias.  Skin: Negative.    Neurological: Negative.  Negative for dizziness and weakness.  Hematological:  Negative for adenopathy. Does not bruise/bleed easily.  Psychiatric/Behavioral:  Positive for confusion and decreased concentration.     Objective:  BP (!) 154/90 (BP Location: Right Arm, Patient Position: Sitting, Cuff Size: Large)   Pulse 71   Temp 98.2 F (36.8 C) (Oral)   Ht 5\' 8"  (1.727 m)   Wt 220 lb (99.8 kg)   SpO2 96%   BMI 33.45 kg/m   BP Readings from Last 3 Encounters:  02/19/23 (!) 154/90  02/22/22 132/84  12/13/21 124/70    Wt Readings from Last 3 Encounters:  02/19/23 220 lb (99.8 kg)  02/22/22 232 lb (105.2 kg)  02/07/22 232 lb (105.2 kg)    Physical Exam Vitals reviewed.  Constitutional:      Appearance: Normal appearance.  HENT:     Mouth/Throat:     Mouth: Mucous membranes are moist.  Eyes:     General: No scleral icterus.    Conjunctiva/sclera: Conjunctivae normal.  Cardiovascular:     Rate and Rhythm: Normal rate and regular rhythm.     Heart sounds: Normal heart sounds, S1 normal and S2 normal. No murmur heard.    No friction rub. No gallop.     Comments: EKG- NSR, 74 bpm LAD Minimal LVH Septal infarct pattern is old Lateral infarct pattern is new Pulmonary:     Effort: Pulmonary effort is normal.     Breath sounds: No stridor. No wheezing, rhonchi or rales.  Abdominal:     General: Abdomen  is flat.     Palpations: There is no mass.     Tenderness: There is no abdominal tenderness. There is no guarding.     Hernia: No hernia is present.  Musculoskeletal:     Right lower leg: No edema.     Left lower leg: No edema.  Skin:    General: Skin is warm and dry.  Neurological:     General: No focal deficit present.     Mental Status: She is alert. Mental status is at baseline.  Psychiatric:        Mood and Affect: Mood normal.        Behavior: Behavior normal.     Lab Results  Component Value Date   WBC 5.0 02/19/2023   HGB 14.1 02/19/2023   HCT 41.8  02/19/2023   PLT 229.0 02/19/2023   GLUCOSE 99 02/19/2023   CHOL 203 (H) 02/19/2023   TRIG 134.0 02/19/2023   HDL 72.10 02/19/2023   LDLDIRECT 112.0 08/26/2019   LDLCALC 104 (H) 02/19/2023   ALT 21 02/19/2023   AST 22 02/19/2023   NA 141 02/19/2023   K 4.7 02/19/2023   CL 104 02/19/2023   CREATININE 0.91 02/19/2023   BUN 21 02/19/2023   CO2 27 02/19/2023   TSH 2.25 02/19/2023   INR 0.9 06/11/2018   HGBA1C 5.0 06/11/2018    MM DIAG BREAST TOMO BILATERAL  Result Date: 12/21/2021 CLINICAL DATA:  Patient presents for diffuse upper-outer bilateral breast tenderness. EXAM: DIGITAL DIAGNOSTIC BILATERAL MAMMOGRAM WITH TOMOSYNTHESIS TECHNIQUE: Bilateral digital diagnostic mammography and breast tomosynthesis was performed. COMPARISON:  Previous exam(s). ACR Breast Density Category b: There are scattered areas of fibroglandular density. FINDINGS: No concerning masses, calcifications or distortion identified within either breast. IMPRESSION: No mammographic evidence for malignancy. RECOMMENDATION: Screening mammogram in one year.(Code:SM-B-01Y) Continued clinical evaluation for diffuse bilateral breast tenderness. I have discussed the findings and recommendations with the patient. If applicable, a reminder letter will be sent to the patient regarding the next appointment. BI-RADS CATEGORY  1: Negative. Electronically Signed   By: Annia Belt M.D.   On: 12/21/2021 10:03    Assessment & Plan:   Essential hypertension- Her BP is not at goal. Will add indapamide. -     TSH; Future -     Urinalysis, Routine w reflex microscopic; Future -     Basic metabolic panel; Future -     CBC with Differential/Platelet; Future -     EKG 12-Lead -     Losartan Potassium; Take 1 tablet (100 mg total) by mouth daily.  Dispense: 90 tablet; Refill: 0 -     Indapamide; Take 1 tablet (1.25 mg total) by mouth daily.  Dispense: 90 tablet; Refill: 0  Hyperlipidemia LDL goal <130 - LDL goal achieved. Doing well on the  statin  -     Lipid panel; Future -     TSH; Future -     Hepatic function panel; Future -     Simvastatin; Take 1 tablet (20 mg total) by mouth daily at 6 PM.  Dispense: 90 tablet; Refill: 0  Elevated LFTs -     Lipid panel; Future -     Hepatic function panel; Future  Abnormal electrocardiogram (ECG) (EKG) -     Troponin I (High Sensitivity); Future -     ECHOCARDIOGRAM COMPLETE; Future  Flu vaccine need -     Flu Vaccine Trivalent High Dose (Fluad)  Estrogen deficiency -     DG Bone  Density; Future  Other orders -     Shingrix; Inject 0.5 mLs into the muscle once for 1 dose.  Dispense: 0.5 mL; Refill: 1 -     Boostrix; Inject 0.5 mLs into the muscle once for 1 dose.  Dispense: 0.5 mL; Refill: 0     Follow-up: Return in about 3 months (around 05/22/2023).  Sanda Linger, MD

## 2023-02-19 NOTE — Patient Instructions (Signed)
Hypertension, Adult High blood pressure (hypertension) is when the force of blood pumping through the arteries is too strong. The arteries are the blood vessels that carry blood from the heart throughout the body. Hypertension forces the heart to work harder to pump blood and may cause arteries to become narrow or stiff. Untreated or uncontrolled hypertension can lead to a heart attack, heart failure, a stroke, kidney disease, and other problems. A blood pressure reading consists of a higher number over a lower number. Ideally, your blood pressure should be below 120/80. The first ("top") number is called the systolic pressure. It is a measure of the pressure in your arteries as your heart beats. The second ("bottom") number is called the diastolic pressure. It is a measure of the pressure in your arteries as the heart relaxes. What are the causes? The exact cause of this condition is not known. There are some conditions that result in high blood pressure. What increases the risk? Certain factors may make you more likely to develop high blood pressure. Some of these risk factors are under your control, including: Smoking. Not getting enough exercise or physical activity. Being overweight. Having too much fat, sugar, calories, or salt (sodium) in your diet. Drinking too much alcohol. Other risk factors include: Having a personal history of heart disease, diabetes, high cholesterol, or kidney disease. Stress. Having a family history of high blood pressure and high cholesterol. Having obstructive sleep apnea. Age. The risk increases with age. What are the signs or symptoms? High blood pressure may not cause symptoms. Very high blood pressure (hypertensive crisis) may cause: Headache. Fast or irregular heartbeats (palpitations). Shortness of breath. Nosebleed. Nausea and vomiting. Vision changes. Severe chest pain, dizziness, and seizures. How is this diagnosed? This condition is diagnosed by  measuring your blood pressure while you are seated, with your arm resting on a flat surface, your legs uncrossed, and your feet flat on the floor. The cuff of the blood pressure monitor will be placed directly against the skin of your upper arm at the level of your heart. Blood pressure should be measured at least twice using the same arm. Certain conditions can cause a difference in blood pressure between your right and left arms. If you have a high blood pressure reading during one visit or you have normal blood pressure with other risk factors, you may be asked to: Return on a different day to have your blood pressure checked again. Monitor your blood pressure at home for 1 week or longer. If you are diagnosed with hypertension, you may have other blood or imaging tests to help your health care provider understand your overall risk for other conditions. How is this treated? This condition is treated by making healthy lifestyle changes, such as eating healthy foods, exercising more, and reducing your alcohol intake. You may be referred for counseling on a healthy diet and physical activity. Your health care provider may prescribe medicine if lifestyle changes are not enough to get your blood pressure under control and if: Your systolic blood pressure is above 130. Your diastolic blood pressure is above 80. Your personal target blood pressure may vary depending on your medical conditions, your age, and other factors. Follow these instructions at home: Eating and drinking  Eat a diet that is high in fiber and potassium, and low in sodium, added sugar, and fat. An example of this eating plan is called the DASH diet. DASH stands for Dietary Approaches to Stop Hypertension. To eat this way: Eat   plenty of fresh fruits and vegetables. Try to fill one half of your plate at each meal with fruits and vegetables. Eat whole grains, such as whole-wheat pasta, brown rice, or whole-grain bread. Fill about one  fourth of your plate with whole grains. Eat or drink low-fat dairy products, such as skim milk or low-fat yogurt. Avoid fatty cuts of meat, processed or cured meats, and poultry with skin. Fill about one fourth of your plate with lean proteins, such as fish, chicken without skin, beans, eggs, or tofu. Avoid pre-made and processed foods. These tend to be higher in sodium, added sugar, and fat. Reduce your daily sodium intake. Many people with hypertension should eat less than 1,500 mg of sodium a day. Do not drink alcohol if: Your health care provider tells you not to drink. You are pregnant, may be pregnant, or are planning to become pregnant. If you drink alcohol: Limit how much you have to: 0-1 drink a day for women. 0-2 drinks a day for men. Know how much alcohol is in your drink. In the [place] one drink equals one 12 oz bottle of beer (355 mL), one 5 oz glass of wine (148 mL), or one 1 oz glass of hard liquor (44 mL). Lifestyle  Work with your health care provider to maintain a healthy body weight or to lose weight. Ask what an ideal weight is for you. Get at least 30 minutes of exercise that causes your heart to beat faster (aerobic exercise) most days of the week. Activities may include walking, swimming, or biking. Include exercise to strengthen your muscles (resistance exercise), such as Pilates or lifting weights, as part of your weekly exercise routine. Try to do these types of exercises for 30 minutes at least 3 days a week. Do not use any products that contain nicotine or tobacco. These products include cigarettes, chewing tobacco, and vaping devices, such as e-cigarettes. If you need help quitting, ask your health care provider. Monitor your blood pressure at home as told by your health care provider. Keep all follow-up visits. This is important. Medicines Take over-the-counter and prescription medicines only as told by your health care provider. Follow directions carefully. Blood  pressure medicines must be taken as prescribed. Do not skip doses of blood pressure medicine. Doing this puts you at risk for problems and can make the medicine less effective. Ask your health care provider about side effects or reactions to medicines that you should watch for. Contact a health care provider if you: Think you are having a reaction to a medicine you are taking. Have headaches that keep coming back (recurring). Feel dizzy. Have swelling in your ankles. Have trouble with your vision. Get help right away if you: Develop a severe headache or confusion. Have unusual weakness or numbness. Feel faint. Have severe pain in your chest or abdomen. Vomit repeatedly. Have trouble breathing. These symptoms may be an emergency. Get help right away. Call 911. Do not wait to see if the symptoms will go away. Do not drive yourself to the hospital. Summary Hypertension is when the force of blood pumping through your arteries is too strong. If this condition is not controlled, it may put you at risk for serious complications. Your personal target blood pressure may vary depending on your medical conditions, your age, and other factors. For most people, a normal blood pressure is less than 120/80. Hypertension is treated with lifestyle changes, medicines, or a combination of both. Lifestyle changes include losing weight, eating a healthy,   low-sodium diet, exercising more, and limiting alcohol. This information is not intended to replace advice given to you by your health care provider. Make sure you discuss any questions you have with your health care provider. Document Revised: 03/06/2021 Document Reviewed: 03/06/2021 Elsevier Patient Education  2024 [employer]  

## 2023-02-21 ENCOUNTER — Other Ambulatory Visit: Payer: Self-pay | Admitting: Internal Medicine

## 2023-02-21 DIAGNOSIS — Z1231 Encounter for screening mammogram for malignant neoplasm of breast: Secondary | ICD-10-CM

## 2023-02-25 IMAGING — MG MM DIGITAL SCREENING BILAT W/ TOMO AND CAD
6 of 10 series · 6 of 30 positions shown · non-contrast
Comparison: Previous exam(s).

CLINICAL DATA: Screening.

EXAM:
DIGITAL SCREENING BILATERAL MAMMOGRAM WITH TOMOSYNTHESIS AND CAD
TECHNIQUE: Bilateral screening digital craniocaudal and mediolateral oblique
mammograms were obtained. Bilateral screening digital breast
tomosynthesis was performed. The images were evaluated with
computer-aided detection.

[R CC synth-2D]
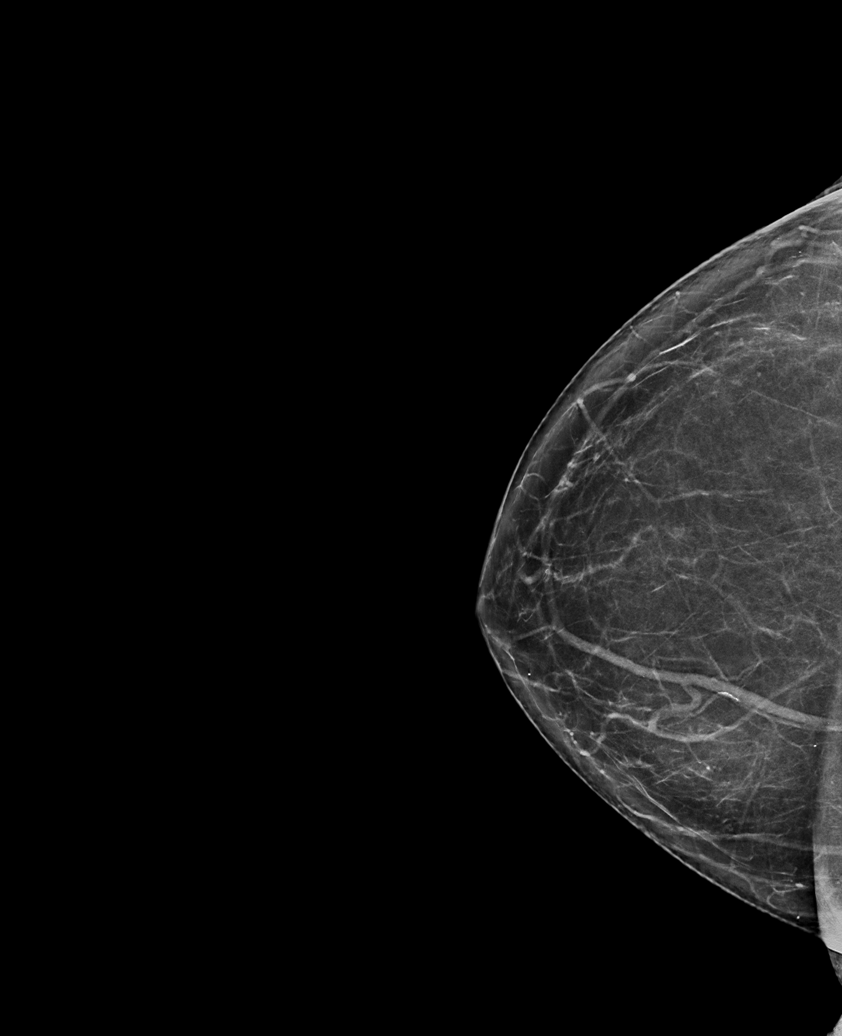

[L CC synth-2D]
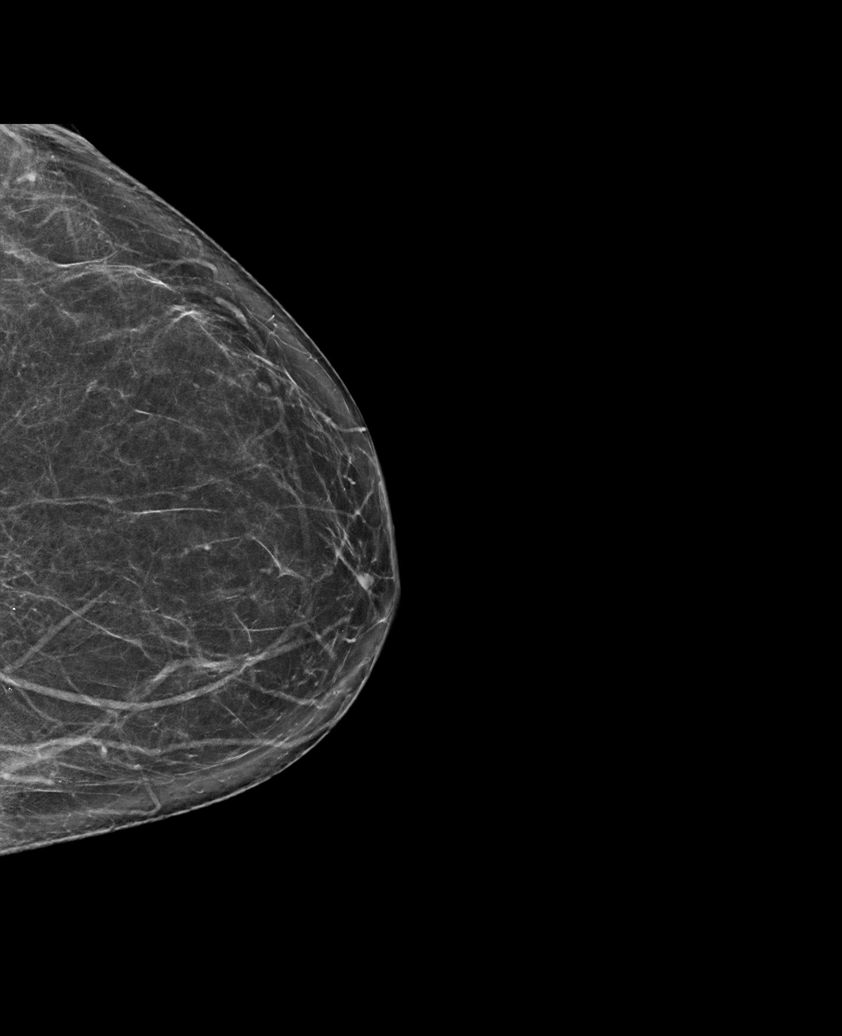

[L MLO synth-2D]
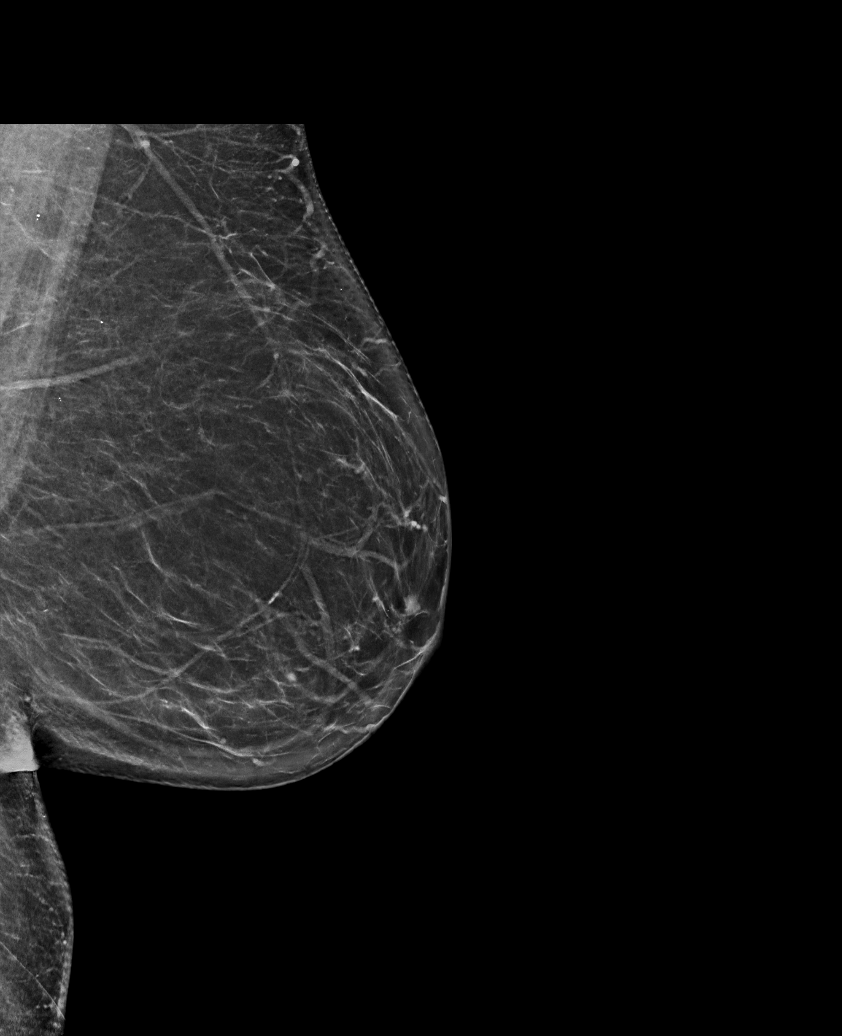

[R MLO synth-2D (1 of 2)]
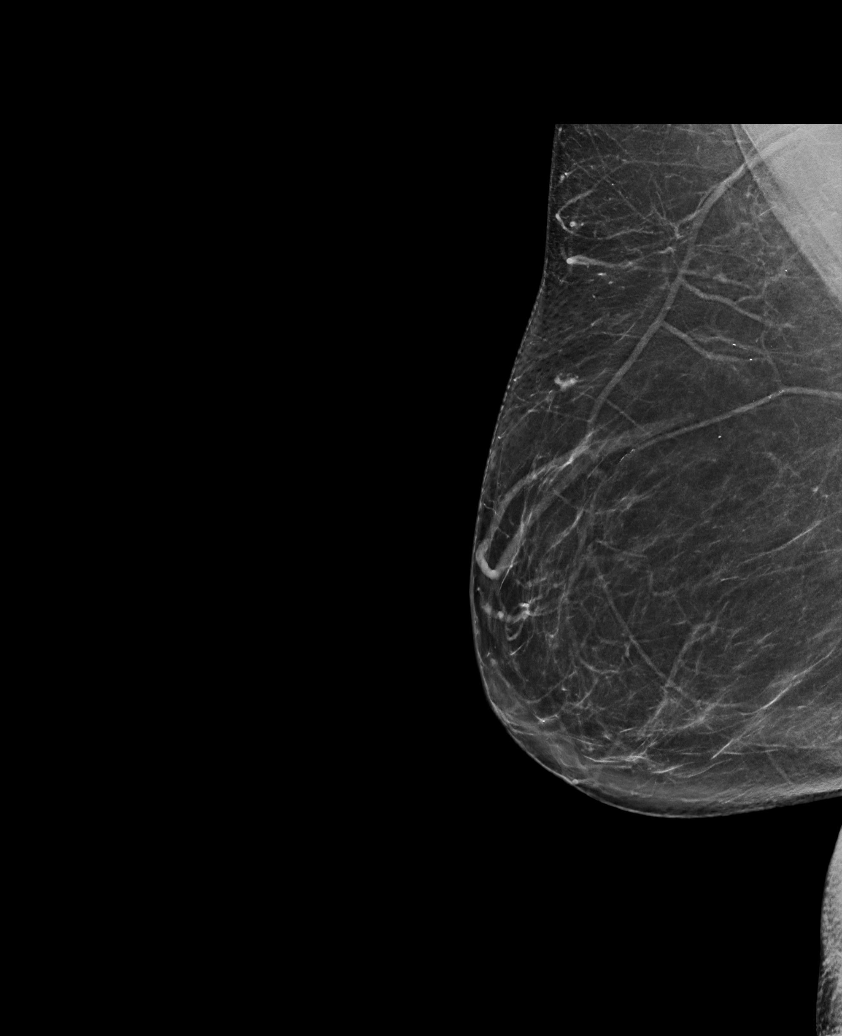

[R MLO synth-2D (2 of 2)]
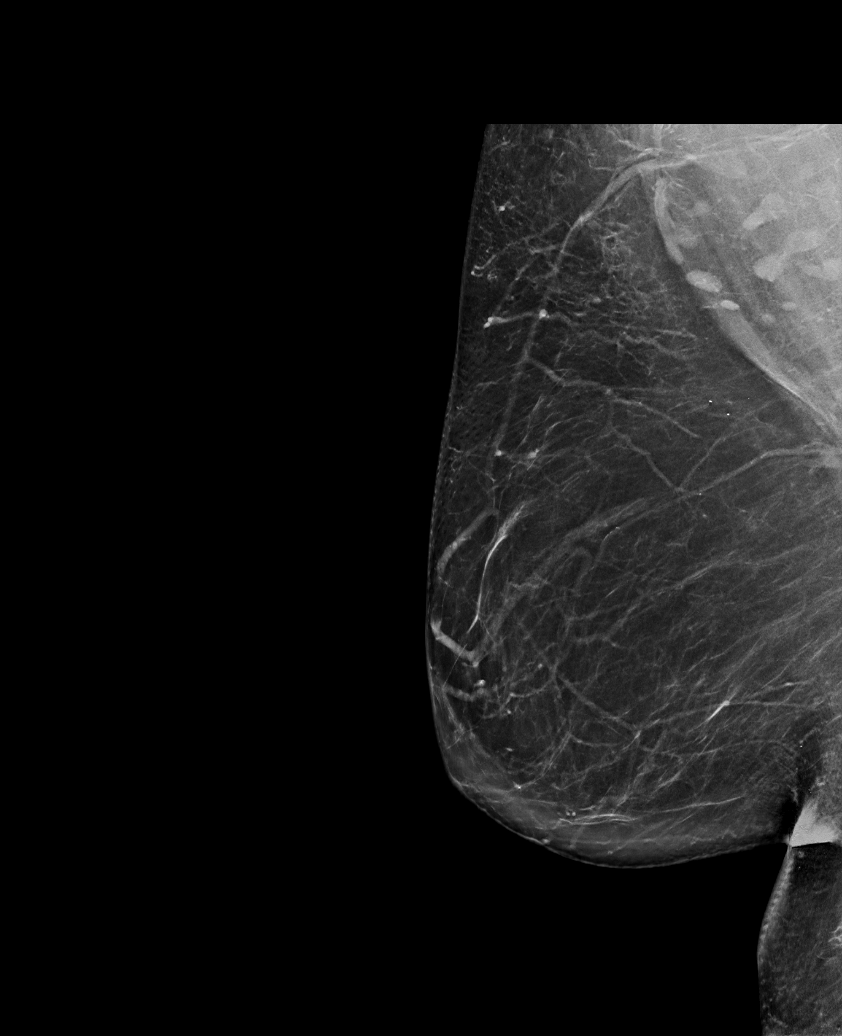

[L MLO tomo · tomo slice 37/74.0]
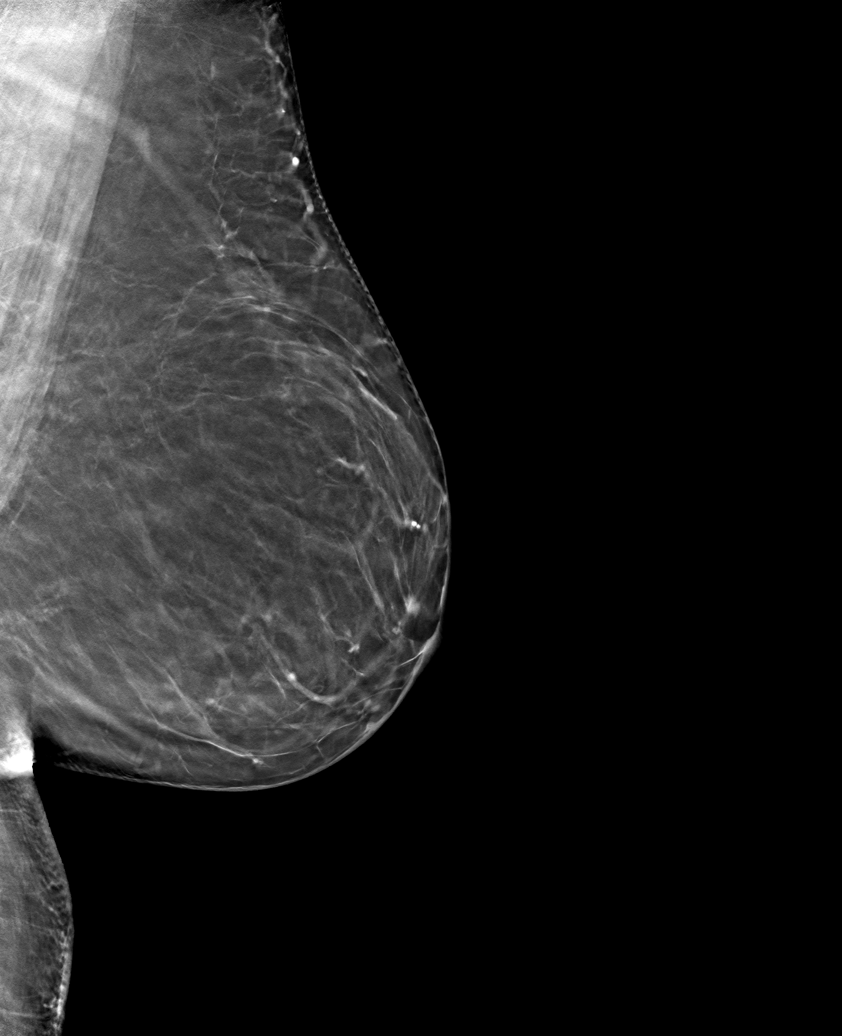

[6 of 30 positions shown; findings below may reference images not displayed]

ACR Breast Density Category b: There are scattered areas of
fibroglandular density.
FINDINGS: There are no findings suspicious for malignancy. The images were
evaluated with computer-aided detection.
IMPRESSION: No mammographic evidence of malignancy. A result letter of this
screening mammogram will be mailed directly to the patient.

RECOMMENDATION:
Screening mammogram in one year. (Code:[id number])

BI-RADS CATEGORY  1: Negative.

## 2023-02-27 ENCOUNTER — Ambulatory Visit
Admission: RE | Admit: 2023-02-27 | Discharge: 2023-02-27 | Disposition: A | Discharge status: Home / Self Care | Payer: Medicare Other | Source: Ambulatory Visit | Attending: Internal Medicine | Admitting: Internal Medicine

## 2023-02-27 DIAGNOSIS — Z1231 Encounter for screening mammogram for malignant neoplasm of breast: Secondary | ICD-10-CM

## 2023-03-07 ENCOUNTER — Ambulatory Visit (HOSPITAL_COMMUNITY): Payer: Medicare Other | Attending: Internal Medicine

## 2023-03-07 DIAGNOSIS — R9431 Abnormal electrocardiogram [ECG] [EKG]: Secondary | ICD-10-CM | POA: Diagnosis not present

## 2023-03-07 LAB — ECHOCARDIOGRAM COMPLETE
Area-P 1/2: 3.34 cm2
S' Lateral: 3.65 cm

## 2023-03-13 ENCOUNTER — Inpatient Hospital Stay: Admission: RE | Admit: 2023-03-13 | Payer: Medicare Other | Source: Ambulatory Visit

## 2023-04-03 DIAGNOSIS — D0472 Carcinoma in situ of skin of left lower limb, including hip: Secondary | ICD-10-CM | POA: Diagnosis not present

## 2023-04-03 DIAGNOSIS — L570 Actinic keratosis: Secondary | ICD-10-CM | POA: Diagnosis not present

## 2023-04-03 DIAGNOSIS — D485 Neoplasm of uncertain behavior of skin: Secondary | ICD-10-CM | POA: Diagnosis not present

## 2023-04-03 DIAGNOSIS — L304 Erythema intertrigo: Secondary | ICD-10-CM | POA: Diagnosis not present

## 2023-04-03 DIAGNOSIS — D2239 Melanocytic nevi of other parts of face: Secondary | ICD-10-CM | POA: Diagnosis not present

## 2023-04-03 DIAGNOSIS — Z85828 Personal history of other malignant neoplasm of skin: Secondary | ICD-10-CM | POA: Diagnosis not present

## 2023-04-03 DIAGNOSIS — L814 Other melanin hyperpigmentation: Secondary | ICD-10-CM | POA: Diagnosis not present

## 2023-04-03 DIAGNOSIS — L821 Other seborrheic keratosis: Secondary | ICD-10-CM | POA: Diagnosis not present

## 2023-04-17 DIAGNOSIS — H2513 Age-related nuclear cataract, bilateral: Secondary | ICD-10-CM | POA: Diagnosis not present

## 2023-04-17 DIAGNOSIS — H524 Presbyopia: Secondary | ICD-10-CM | POA: Diagnosis not present

## 2023-04-17 DIAGNOSIS — H47022 Hemorrhage in optic nerve sheath, left eye: Secondary | ICD-10-CM | POA: Diagnosis not present

## 2023-04-28 DIAGNOSIS — D0472 Carcinoma in situ of skin of left lower limb, including hip: Secondary | ICD-10-CM | POA: Diagnosis not present

## 2023-05-02 DIAGNOSIS — H2513 Age-related nuclear cataract, bilateral: Secondary | ICD-10-CM | POA: Diagnosis not present

## 2023-05-02 DIAGNOSIS — H40013 Open angle with borderline findings, low risk, bilateral: Secondary | ICD-10-CM | POA: Diagnosis not present

## 2023-05-13 ENCOUNTER — Other Ambulatory Visit: Payer: Self-pay | Admitting: Internal Medicine

## 2023-05-13 DIAGNOSIS — E785 Hyperlipidemia, unspecified: Secondary | ICD-10-CM

## 2023-05-13 DIAGNOSIS — I10 Essential (primary) hypertension: Secondary | ICD-10-CM

## 2023-05-16 ENCOUNTER — Telehealth: Payer: Self-pay | Admitting: Internal Medicine

## 2023-05-16 NOTE — Telephone Encounter (Signed)
**Note De-identified  [place] Obfuscation** Please advise 

## 2023-05-16 NOTE — Telephone Encounter (Signed)
 Copied from CRM [id number]. Topic: General - Other >> May 16, 2023  8:55 AM Chantha C wrote: Reason for CRM: Patient wanted to know if she can do her Bone Density at the [facility], or does she need to go to a different place. Please advise and call back at [phone]  ---  Can order be changed for a different location or does it need to be a [facility] facility? TDW

## 2023-05-16 NOTE — Telephone Encounter (Signed)
 Patient has been made aware of Dr. Yetta Barre comments and gave a verbal understanding.

## 2023-05-19 ENCOUNTER — Other Ambulatory Visit: Payer: Self-pay | Admitting: Internal Medicine

## 2023-05-19 DIAGNOSIS — I10 Essential (primary) hypertension: Secondary | ICD-10-CM

## 2023-05-19 DIAGNOSIS — Z1331 Encounter for screening for depression: Secondary | ICD-10-CM | POA: Diagnosis not present

## 2023-05-19 DIAGNOSIS — Z124 Encounter for screening for malignant neoplasm of cervix: Secondary | ICD-10-CM | POA: Diagnosis not present

## 2023-05-22 ENCOUNTER — Encounter: Payer: Self-pay | Admitting: Internal Medicine

## 2023-05-22 ENCOUNTER — Ambulatory Visit (INDEPENDENT_AMBULATORY_CARE_PROVIDER_SITE_OTHER): Payer: Medicare Other | Admitting: Internal Medicine

## 2023-05-22 VITALS — BP 138/86 | HR 65 | Temp 97.8°F | Resp 16 | Ht 68.0 in | Wt 212.8 lb

## 2023-05-22 DIAGNOSIS — I10 Essential (primary) hypertension: Secondary | ICD-10-CM | POA: Diagnosis not present

## 2023-05-22 DIAGNOSIS — R9431 Abnormal electrocardiogram [ECG] [EKG]: Secondary | ICD-10-CM

## 2023-05-22 DIAGNOSIS — E785 Hyperlipidemia, unspecified: Secondary | ICD-10-CM

## 2023-05-22 NOTE — Patient Instructions (Signed)
 Hypertension, Adult High blood pressure (hypertension) is when the force of blood pumping through the arteries is too strong. The arteries are the blood vessels that carry blood from the heart throughout the body. Hypertension forces the heart to work harder to pump blood and may cause arteries to become narrow or stiff. Untreated or uncontrolled hypertension can lead to a heart attack, heart failure, a stroke, kidney disease, and other problems. A blood pressure reading consists of a higher number over a lower number. Ideally, your blood pressure should be below 120/80. The first ("top") number is called the systolic pressure. It is a measure of the pressure in your arteries as your heart beats. The second ("bottom") number is called the diastolic pressure. It is a measure of the pressure in your arteries as the heart relaxes. What are the causes? The exact cause of this condition is not known. There are some conditions that result in high blood pressure. What increases the risk? Certain factors may make you more likely to develop high blood pressure. Some of these risk factors are under your control, including: Smoking. Not getting enough exercise or physical activity. Being overweight. Having too much fat, sugar, calories, or salt (sodium) in your diet. Drinking too much alcohol. Other risk factors include: Having a personal history of heart disease, diabetes, high cholesterol, or kidney disease. Stress. Having a family history of high blood pressure and high cholesterol. Having obstructive sleep apnea. Age. The risk increases with age. What are the signs or symptoms? High blood pressure may not cause symptoms. Very high blood pressure (hypertensive crisis) may cause: Headache. Fast or irregular heartbeats (palpitations). Shortness of breath. Nosebleed. Nausea and vomiting. Vision changes. Severe chest pain, dizziness, and seizures. How is this diagnosed? This condition is diagnosed by  measuring your blood pressure while you are seated, with your arm resting on a flat surface, your legs uncrossed, and your feet flat on the floor. The cuff of the blood pressure monitor will be placed directly against the skin of your upper arm at the level of your heart. Blood pressure should be measured at least twice using the same arm. Certain conditions can cause a difference in blood pressure between your right and left arms. If you have a high blood pressure reading during one visit or you have normal blood pressure with other risk factors, you may be asked to: Return on a different day to have your blood pressure checked again. Monitor your blood pressure at home for 1 week or longer. If you are diagnosed with hypertension, you may have other blood or imaging tests to help your health care provider understand your overall risk for other conditions. How is this treated? This condition is treated by making healthy lifestyle changes, such as eating healthy foods, exercising more, and reducing your alcohol intake. You may be referred for counseling on a healthy diet and physical activity. Your health care provider may prescribe medicine if lifestyle changes are not enough to get your blood pressure under control and if: Your systolic blood pressure is above 130. Your diastolic blood pressure is above 80. Your personal target blood pressure may vary depending on your medical conditions, your age, and other factors. Follow these instructions at home: Eating and drinking  Eat a diet that is high in fiber and potassium, and low in sodium, added sugar, and fat. An example of this eating plan is called the DASH diet. DASH stands for Dietary Approaches to Stop Hypertension. To eat this way: Eat  plenty of fresh fruits and vegetables. Try to fill one half of your plate at each meal with fruits and vegetables. Eat whole grains, such as whole-wheat pasta, brown rice, or whole-grain bread. Fill about one  fourth of your plate with whole grains. Eat or drink low-fat dairy products, such as skim milk or low-fat yogurt. Avoid fatty cuts of meat, processed or cured meats, and poultry with skin. Fill about one fourth of your plate with lean proteins, such as fish, chicken without skin, beans, eggs, or tofu. Avoid pre-made and processed foods. These tend to be higher in sodium, added sugar, and fat. Reduce your daily sodium intake. Many people with hypertension should eat less than 1,500 mg of sodium a day. Do not drink alcohol if: Your health care provider tells you not to drink. You are pregnant, may be pregnant, or are planning to become pregnant. If you drink alcohol: Limit how much you have to: 0-1 drink a day for women. 0-2 drinks a day for men. Know how much alcohol is in your drink. In the [place] one drink equals one 12 oz bottle of beer (355 mL), one 5 oz glass of wine (148 mL), or one 1 oz glass of hard liquor (44 mL). Lifestyle  Work with your health care provider to maintain a healthy body weight or to lose weight. Ask what an ideal weight is for you. Get at least 30 minutes of exercise that causes your heart to beat faster (aerobic exercise) most days of the week. Activities may include walking, swimming, or biking. Include exercise to strengthen your muscles (resistance exercise), such as Pilates or lifting weights, as part of your weekly exercise routine. Try to do these types of exercises for 30 minutes at least 3 days a week. Do not use any products that contain nicotine or tobacco. These products include cigarettes, chewing tobacco, and vaping devices, such as e-cigarettes. If you need help quitting, ask your health care provider. Monitor your blood pressure at home as told by your health care provider. Keep all follow-up visits. This is important. Medicines Take over-the-counter and prescription medicines only as told by your health care provider. Follow directions carefully. Blood  pressure medicines must be taken as prescribed. Do not skip doses of blood pressure medicine. Doing this puts you at risk for problems and can make the medicine less effective. Ask your health care provider about side effects or reactions to medicines that you should watch for. Contact a health care provider if you: Think you are having a reaction to a medicine you are taking. Have headaches that keep coming back (recurring). Feel dizzy. Have swelling in your ankles. Have trouble with your vision. Get help right away if you: Develop a severe headache or confusion. Have unusual weakness or numbness. Feel faint. Have severe pain in your chest or abdomen. Vomit repeatedly. Have trouble breathing. These symptoms may be an emergency. Get help right away. Call 911. Do not wait to see if the symptoms will go away. Do not drive yourself to the hospital. Summary Hypertension is when the force of blood pumping through your arteries is too strong. If this condition is not controlled, it may put you at risk for serious complications. Your personal target blood pressure may vary depending on your medical conditions, your age, and other factors. For most people, a normal blood pressure is less than 120/80. Hypertension is treated with lifestyle changes, medicines, or a combination of both. Lifestyle changes include losing weight, eating a healthy,  low-sodium diet, exercising more, and limiting alcohol. This information is not intended to replace advice given to you by your health care provider. Make sure you discuss any questions you have with your health care provider. Document Revised: 03/06/2021 Document Reviewed: 03/06/2021 Elsevier Patient Education  2024 [employer].

## 2023-05-22 NOTE — Progress Notes (Signed)
 Subjective:  Patient ID: Dawn [name], female    DOB: 1950-11-23  Age: 73 y.o. MRN: [id number]  CC: Hypertension   HPI Dawn [name] presents for f/up -----  Discussed the use of AI scribe software for clinical note transcription with the patient, who gave verbal consent to proceed.  History of Present Illness   The patient, with a history of hypertension and hyperlipidemia, presents for a routine follow-up. She denies any symptoms of high blood pressure such as headache, blurred vision, chest pain, or shortness of breath. She is currently on a regimen of Simvastatin  20mg , which she tolerates well without any reported side effects such as muscle aches.  The patient has expressed interest in a coronary calcium  score test due to recent discussions among her social circle. She denies any symptoms of cardiac disease such as chest pain, shortness of breath, or unexplained fatigue. She has previously undergone an echocardiogram, the results of which are not discussed in this consultation.  The patient also reports that she has recently acquired a digital blood pressure cuff and has been monitoring her blood pressure at home. The readings have been within a satisfactory range. She is due for a bone density scan and mammogram, which had to be rescheduled due to a family member's hospitalization. She is also due for a tetanus shot and shingles vaccine, but has chosen to delay these due to upcoming eye surgery.        Outpatient Medications Prior to Visit  Medication Sig Dispense Refill   ALPRAZolam  (XANAX ) 0.5 MG tablet Take 1 tablet (0.5 mg total) by mouth 2 (two) times daily as needed for anxiety. 20 tablet 0   indapamide  (LOZOL ) 1.25 MG tablet TAKE 1 TABLET BY MOUTH DAILY. 90 tablet 0   losartan  (COZAAR ) 100 MG tablet TAKE 1 TABLET BY MOUTH EVERY DAY 90 tablet 0   scopolamine  (TRANSDERM-SCOP) 1 MG/3DAYS Place 1 patch (1.5 mg total) onto the skin every 3 (three) days. 10 patch 0    simvastatin  (ZOCOR ) 20 MG tablet TAKE 1 TABLET BY MOUTH DAILY AT 6 PM. 90 tablet 0   Zoster Vaccine Adjuvanted (SHINGRIX ) injection Inject 0.5 mLs into the muscle once for 1 dose. 0.5 mL 1   No facility-administered medications prior to visit.    ROS Review of Systems  Constitutional:  Negative for chills, diaphoresis, fatigue and fever.  HENT: Negative.    Eyes: Negative.   Respiratory:  Negative for cough, chest tightness, shortness of breath and wheezing.   Cardiovascular:  Negative for chest pain, palpitations and leg swelling.  Gastrointestinal:  Negative for abdominal pain, constipation, diarrhea, nausea and vomiting.  Endocrine: Negative.   Genitourinary: Negative.  Negative for difficulty urinating.  Musculoskeletal:  Positive for arthralgias. Negative for myalgias.  Skin:  Negative for color change and pallor.  Neurological:  Negative for dizziness and weakness.  Hematological:  Negative for adenopathy. Does not bruise/bleed easily.  Psychiatric/Behavioral: Negative.      Objective:  BP 138/86 (BP Location: Left Arm, Patient Position: Sitting, Cuff Size: Large)   Pulse 65   Temp 97.8 F (36.6 C) (Oral)   Resp 16   Ht 5' 8 (1.727 m)   Wt 212 lb 12.8 oz (96.5 kg)   SpO2 94%   BMI 32.36 kg/m   BP Readings from Last 3 Encounters:  05/22/23 138/86  02/19/23 (!) 154/90  02/22/22 132/84    Wt Readings from Last 3 Encounters:  05/22/23 212 lb 12.8 oz (96.5 kg)  02/19/23 220 lb (99.8 kg)  02/22/22 232 lb (105.2 kg)    Physical Exam Vitals reviewed.  Constitutional:      Appearance: Normal appearance.  HENT:     Mouth/Throat:     Mouth: Mucous membranes are moist.  Eyes:     General: No scleral icterus.    Conjunctiva/sclera: Conjunctivae normal.  Cardiovascular:     Rate and Rhythm: Normal rate and regular rhythm.     Heart sounds: No murmur heard.    No friction rub. No gallop.  Pulmonary:     Effort: Pulmonary effort is normal.     Breath sounds: No  stridor. No wheezing, rhonchi or rales.  Abdominal:     General: Abdomen is flat.     Palpations: There is no mass.     Tenderness: There is no abdominal tenderness. There is no guarding.     Hernia: No hernia is present.  Musculoskeletal:        General: Normal range of motion.     Cervical back: Neck supple.     Right lower leg: No edema.     Left lower leg: No edema.  Skin:    General: Skin is warm and dry.  Neurological:     General: No focal deficit present.     Mental Status: She is alert. Mental status is at baseline.  Psychiatric:        Mood and Affect: Mood normal.        Behavior: Behavior normal.     Lab Results  Component Value Date   WBC 5.0 02/19/2023   HGB 14.1 02/19/2023   HCT 41.8 02/19/2023   PLT 229.0 02/19/2023   GLUCOSE 99 02/19/2023   CHOL 203 (H) 02/19/2023   TRIG 134.0 02/19/2023   HDL 72.10 02/19/2023   LDLDIRECT 112.0 08/26/2019   LDLCALC 104 (H) 02/19/2023   ALT 21 02/19/2023   AST 22 02/19/2023   NA 141 02/19/2023   K 4.7 02/19/2023   CL 104 02/19/2023   CREATININE 0.91 02/19/2023   BUN 21 02/19/2023   CO2 27 02/19/2023   TSH 2.25 02/19/2023   INR 0.9 06/11/2018   HGBA1C 5.0 06/11/2018    MM 3D SCREENING MAMMOGRAM BILATERAL BREAST Result Date: 02/28/2023 CLINICAL DATA:  Screening. EXAM: DIGITAL SCREENING BILATERAL MAMMOGRAM WITH TOMOSYNTHESIS AND CAD TECHNIQUE: Bilateral screening digital craniocaudal and mediolateral oblique mammograms were obtained. Bilateral screening digital breast tomosynthesis was performed. The images were evaluated with computer-aided detection. COMPARISON:  Previous exam(s). ACR Breast Density Category a: The breasts are almost entirely fatty. FINDINGS: There are no findings suspicious for malignancy. IMPRESSION: No mammographic evidence of malignancy. A result letter of this screening mammogram will be mailed directly to the patient. RECOMMENDATION: Screening mammogram in one year. (Code:SM-B-01Y) BI-RADS  CATEGORY  1: Negative. Electronically Signed   By: Alm Parkins M.D.   On: 02/28/2023 15:55    Assessment & Plan:   Essential hypertension- Her blood pressure is adequately well-controlled. -     CT CARDIAC SCORING ([facility] LOCATIONS ONLY); Future  Hyperlipidemia LDL goal <130- LDL goal achieved. Doing well on the statin  Will risk stratify with a CCS. -     CT CARDIAC SCORING ([facility] LOCATIONS ONLY); Future  Abnormal electrocardiogram (ECG) (EKG) -     CT CARDIAC SCORING ([facility] LOCATIONS ONLY); Future     Follow-up: Return in about 4 months (around 09/19/2023).  Debby Molt, MD

## 2023-06-12 ENCOUNTER — Other Ambulatory Visit: Payer: Medicare Other

## 2023-06-13 ENCOUNTER — Other Ambulatory Visit (HOSPITAL_COMMUNITY): Payer: Self-pay

## 2023-06-18 DIAGNOSIS — H25811 Combined forms of age-related cataract, right eye: Secondary | ICD-10-CM | POA: Diagnosis not present

## 2023-06-18 DIAGNOSIS — H2511 Age-related nuclear cataract, right eye: Secondary | ICD-10-CM | POA: Diagnosis not present

## 2023-07-10 DIAGNOSIS — L570 Actinic keratosis: Secondary | ICD-10-CM | POA: Diagnosis not present

## 2023-07-10 DIAGNOSIS — Z85828 Personal history of other malignant neoplasm of skin: Secondary | ICD-10-CM | POA: Diagnosis not present

## 2023-07-10 DIAGNOSIS — L821 Other seborrheic keratosis: Secondary | ICD-10-CM | POA: Diagnosis not present

## 2023-08-13 ENCOUNTER — Other Ambulatory Visit (HOSPITAL_COMMUNITY): Payer: Self-pay

## 2023-09-11 ENCOUNTER — Other Ambulatory Visit: Payer: Self-pay | Admitting: Internal Medicine

## 2023-09-11 DIAGNOSIS — E785 Hyperlipidemia, unspecified: Secondary | ICD-10-CM

## 2023-09-11 DIAGNOSIS — I10 Essential (primary) hypertension: Secondary | ICD-10-CM

## 2023-09-12 ENCOUNTER — Other Ambulatory Visit: Payer: Self-pay | Admitting: Internal Medicine

## 2023-09-12 DIAGNOSIS — I10 Essential (primary) hypertension: Secondary | ICD-10-CM

## 2023-09-18 ENCOUNTER — Ambulatory Visit (INDEPENDENT_AMBULATORY_CARE_PROVIDER_SITE_OTHER): Payer: Medicare Other | Admitting: Internal Medicine

## 2023-09-18 ENCOUNTER — Encounter: Payer: Self-pay | Admitting: Internal Medicine

## 2023-09-18 VITALS — BP 142/90 | HR 88 | Temp 98.3°F | Resp 16 | Ht 68.0 in | Wt 212.2 lb

## 2023-09-18 DIAGNOSIS — I10 Essential (primary) hypertension: Secondary | ICD-10-CM | POA: Diagnosis not present

## 2023-09-18 DIAGNOSIS — E785 Hyperlipidemia, unspecified: Secondary | ICD-10-CM | POA: Diagnosis not present

## 2023-09-18 DIAGNOSIS — E2839 Other primary ovarian failure: Secondary | ICD-10-CM | POA: Insufficient documentation

## 2023-09-18 DIAGNOSIS — Z23 Encounter for immunization: Secondary | ICD-10-CM | POA: Insufficient documentation

## 2023-09-18 LAB — HEPATIC FUNCTION PANEL
ALT: 18 U/L (ref 0–35)
AST: 23 U/L (ref 0–37)
Albumin: 4.5 g/dL (ref 3.5–5.2)
Alkaline Phosphatase: 62 U/L (ref 39–117)
Bilirubin, Direct: 0.1 mg/dL (ref 0.0–0.3)
Total Bilirubin: 0.7 mg/dL (ref 0.2–1.2)
Total Protein: 7.4 g/dL (ref 6.0–8.3)

## 2023-09-18 LAB — CBC WITH DIFFERENTIAL/PLATELET
Basophils Absolute: 0.1 10*3/uL (ref 0.0–0.1)
Basophils Relative: 2.4 % (ref 0.0–3.0)
Eosinophils Absolute: 0.1 10*3/uL (ref 0.0–0.7)
Eosinophils Relative: 2.5 % (ref 0.0–5.0)
HCT: 43 % (ref 36.0–46.0)
Hemoglobin: 14.4 g/dL (ref 12.0–15.0)
Lymphocytes Relative: 36.3 % (ref 12.0–46.0)
Lymphs Abs: 1.7 10*3/uL (ref 0.7–4.0)
MCHC: 33.5 g/dL (ref 30.0–36.0)
MCV: 96.3 fl (ref 78.0–100.0)
Monocytes Absolute: 0.4 10*3/uL (ref 0.1–1.0)
Monocytes Relative: 8.9 % (ref 3.0–12.0)
Neutro Abs: 2.4 10*3/uL (ref 1.4–7.7)
Neutrophils Relative %: 49.9 % (ref 43.0–77.0)
Platelets: 241 10*3/uL (ref 150.0–400.0)
RBC: 4.47 Mil/uL (ref 3.87–5.11)
RDW: 13.3 % (ref 11.5–15.5)
WBC: 4.8 10*3/uL (ref 4.0–10.5)

## 2023-09-18 LAB — BASIC METABOLIC PANEL WITH GFR
BUN: 31 mg/dL — ABNORMAL HIGH (ref 6–23)
CO2: 30 meq/L (ref 19–32)
Calcium: 9.4 mg/dL (ref 8.4–10.5)
Chloride: 103 meq/L (ref 96–112)
Creatinine, Ser: 0.98 mg/dL (ref 0.40–1.20)
GFR: 57.63 mL/min — ABNORMAL LOW (ref >60.00)
Glucose, Bld: 100 mg/dL — ABNORMAL HIGH (ref 70–99)
Potassium: 4.5 meq/L (ref 3.5–5.1)
Sodium: 140 meq/L (ref 135–145)

## 2023-09-18 MED ORDER — SIMVASTATIN 20 MG PO TABS: 20.0000 mg | ORAL_TABLET | Freq: Every day | ORAL | 1 refills | Status: DC

## 2023-09-18 MED ORDER — BOOSTRIX 5-2.5-18.5 LF-MCG/0.5 IM SUSP: 0.5000 mL | Freq: Once | INTRAMUSCULAR | 0 refills | Status: AC

## 2023-09-18 MED ORDER — LOSARTAN POTASSIUM 100 MG PO TABS: 100.0000 mg | ORAL_TABLET | Freq: Every day | ORAL | 1 refills | Status: DC

## 2023-09-18 MED ORDER — INDAPAMIDE 1.25 MG PO TABS: 1.2500 mg | ORAL_TABLET | Freq: Every day | ORAL | 1 refills | Status: DC

## 2023-09-18 MED ORDER — BOOSTRIX 5-2.5-18.5 LF-MCG/0.5 IM SUSP: 0.5000 mL | Freq: Once | INTRAMUSCULAR | 0 refills | Status: DC

## 2023-09-18 MED ORDER — SHINGRIX 50 MCG/0.5ML IM SUSR: 0.5000 mL | Freq: Once | INTRAMUSCULAR | 1 refills | Status: DC

## 2023-09-18 NOTE — Patient Instructions (Signed)
 Hypertension, Adult High blood pressure (hypertension) is when the force of blood pumping through the arteries is too strong. The arteries are the blood vessels that carry blood from the heart throughout the body. Hypertension forces the heart to work harder to pump blood and may cause arteries to become narrow or stiff. Untreated or uncontrolled hypertension can lead to a heart attack, heart failure, a stroke, kidney disease, and other problems. A blood pressure reading consists of a higher number over a lower number. Ideally, your blood pressure should be below 120/80. The first ("top") number is called the systolic pressure. It is a measure of the pressure in your arteries as your heart beats. The second ("bottom") number is called the diastolic pressure. It is a measure of the pressure in your arteries as the heart relaxes. What are the causes? The exact cause of this condition is not known. There are some conditions that result in high blood pressure. What increases the risk? Certain factors may make you more likely to develop high blood pressure. Some of these risk factors are under your control, including: Smoking. Not getting enough exercise or physical activity. Being overweight. Having too much fat, sugar, calories, or salt (sodium) in your diet. Drinking too much alcohol. Other risk factors include: Having a personal history of heart disease, diabetes, high cholesterol, or kidney disease. Stress. Having a family history of high blood pressure and high cholesterol. Having obstructive sleep apnea. Age. The risk increases with age. What are the signs or symptoms? High blood pressure may not cause symptoms. Very high blood pressure (hypertensive crisis) may cause: Headache. Fast or irregular heartbeats (palpitations). Shortness of breath. Nosebleed. Nausea and vomiting. Vision changes. Severe chest pain, dizziness, and seizures. How is this diagnosed? This condition is diagnosed by  measuring your blood pressure while you are seated, with your arm resting on a flat surface, your legs uncrossed, and your feet flat on the floor. The cuff of the blood pressure monitor will be placed directly against the skin of your upper arm at the level of your heart. Blood pressure should be measured at least twice using the same arm. Certain conditions can cause a difference in blood pressure between your right and left arms. If you have a high blood pressure reading during one visit or you have normal blood pressure with other risk factors, you may be asked to: Return on a different day to have your blood pressure checked again. Monitor your blood pressure at home for 1 week or longer. If you are diagnosed with hypertension, you may have other blood or imaging tests to help your health care provider understand your overall risk for other conditions. How is this treated? This condition is treated by making healthy lifestyle changes, such as eating healthy foods, exercising more, and reducing your alcohol intake. You may be referred for counseling on a healthy diet and physical activity. Your health care provider may prescribe medicine if lifestyle changes are not enough to get your blood pressure under control and if: Your systolic blood pressure is above 130. Your diastolic blood pressure is above 80. Your personal target blood pressure may vary depending on your medical conditions, your age, and other factors. Follow these instructions at home: Eating and drinking  Eat a diet that is high in fiber and potassium, and low in sodium, added sugar, and fat. An example of this eating plan is called the DASH diet. DASH stands for Dietary Approaches to Stop Hypertension. To eat this way: Eat  plenty of fresh fruits and vegetables. Try to fill one half of your plate at each meal with fruits and vegetables. Eat whole grains, such as whole-wheat pasta, brown rice, or whole-grain bread. Fill about one  fourth of your plate with whole grains. Eat or drink low-fat dairy products, such as skim milk or low-fat yogurt. Avoid fatty cuts of meat, processed or cured meats, and poultry with skin. Fill about one fourth of your plate with lean proteins, such as fish, chicken without skin, beans, eggs, or tofu. Avoid pre-made and processed foods. These tend to be higher in sodium, added sugar, and fat. Reduce your daily sodium intake. Many people with hypertension should eat less than 1,500 mg of sodium a day. Do not drink alcohol if: Your health care provider tells you not to drink. You are pregnant, may be pregnant, or are planning to become pregnant. If you drink alcohol: Limit how much you have to: 0-1 drink a day for women. 0-2 drinks a day for men. Know how much alcohol is in your drink. In the [place] one drink equals one 12 oz bottle of beer (355 mL), one 5 oz glass of wine (148 mL), or one 1 oz glass of hard liquor (44 mL). Lifestyle  Work with your health care provider to maintain a healthy body weight or to lose weight. Ask what an ideal weight is for you. Get at least 30 minutes of exercise that causes your heart to beat faster (aerobic exercise) most days of the week. Activities may include walking, swimming, or biking. Include exercise to strengthen your muscles (resistance exercise), such as Pilates or lifting weights, as part of your weekly exercise routine. Try to do these types of exercises for 30 minutes at least 3 days a week. Do not use any products that contain nicotine or tobacco. These products include cigarettes, chewing tobacco, and vaping devices, such as e-cigarettes. If you need help quitting, ask your health care provider. Monitor your blood pressure at home as told by your health care provider. Keep all follow-up visits. This is important. Medicines Take over-the-counter and prescription medicines only as told by your health care provider. Follow directions carefully. Blood  pressure medicines must be taken as prescribed. Do not skip doses of blood pressure medicine. Doing this puts you at risk for problems and can make the medicine less effective. Ask your health care provider about side effects or reactions to medicines that you should watch for. Contact a health care provider if you: Think you are having a reaction to a medicine you are taking. Have headaches that keep coming back (recurring). Feel dizzy. Have swelling in your ankles. Have trouble with your vision. Get help right away if you: Develop a severe headache or confusion. Have unusual weakness or numbness. Feel faint. Have severe pain in your chest or abdomen. Vomit repeatedly. Have trouble breathing. These symptoms may be an emergency. Get help right away. Call 911. Do not wait to see if the symptoms will go away. Do not drive yourself to the hospital. Summary Hypertension is when the force of blood pumping through your arteries is too strong. If this condition is not controlled, it may put you at risk for serious complications. Your personal target blood pressure may vary depending on your medical conditions, your age, and other factors. For most people, a normal blood pressure is less than 120/80. Hypertension is treated with lifestyle changes, medicines, or a combination of both. Lifestyle changes include losing weight, eating a healthy,  low-sodium diet, exercising more, and limiting alcohol. This information is not intended to replace advice given to you by your health care provider. Make sure you discuss any questions you have with your health care provider. Document Revised: 03/06/2021 Document Reviewed: 03/06/2021 Elsevier Patient Education  2024 [employer].

## 2023-09-18 NOTE — Progress Notes (Signed)
 Subjective:  Patient ID: Dawn [name], female    DOB: 11/11/1950  Age: 73 y.o. MRN: [id number]  CC: Hypertension and Hyperlipidemia   HPI Dawn [name] presents for f/up ----  Discussed the use of AI scribe software for clinical note transcription with the patient, who gave verbal consent to proceed.  History of Present Illness   Dawn Lumia Walls "Dawn [name]" is a 73 year old female with hypertension who presents for routine follow-up.  No symptoms of high blood pressure such as headache, blurred vision, chest pain, or shortness of breath. No dizziness or lightheadedness. She is two doses behind on her losartan  as she misplaced it. Current medications include losartan , simvastatin , indapamide , and a baby aspirin .  No symptoms of low blood pressure such as weakness, dizziness, or lightheadedness. No signs of heart failure, such as swelling in her legs or feet, except for mild puffiness if she sits in her office all day without moving.  She needs to update her tetanus shot, shingles vaccine, and bone density test. She had previously scheduled a coronary calcium  score but had to cancel it due to unforeseen circumstances.       Outpatient Medications Prior to Visit  Medication Sig Dispense Refill   ALPRAZolam  (XANAX ) 0.5 MG tablet Take 1 tablet (0.5 mg total) by mouth 2 (two) times daily as needed for anxiety. 20 tablet 0   indapamide  (LOZOL ) 1.25 MG tablet TAKE 1 TABLET BY MOUTH DAILY. 90 tablet 0   losartan  (COZAAR ) 100 MG tablet TAKE 1 TABLET BY MOUTH EVERY DAY 90 tablet 0   scopolamine  (TRANSDERM-SCOP) 1 MG/3DAYS Place 1 patch (1.5 mg total) onto the skin every 3 (three) days. 10 patch 0   simvastatin  (ZOCOR ) 20 MG tablet TAKE 1 TABLET BY MOUTH DAILY AT 6 PM. 90 tablet 0   Zoster Vaccine Adjuvanted (SHINGRIX ) injection Inject 0.5 mLs into the muscle once for 1 dose. 0.5 mL 1   No facility-administered medications prior to visit.    ROS Review of Systems  Constitutional:   Negative for diaphoresis, fatigue and unexpected weight change.  HENT: Negative.    Eyes: Negative.   Respiratory:  Negative for cough, chest tightness, shortness of breath and wheezing.   Cardiovascular:  Negative for chest pain, palpitations and leg swelling.  Gastrointestinal:  Negative for abdominal pain, constipation, diarrhea, nausea and vomiting.  Endocrine: Negative.   Genitourinary: Negative.  Negative for difficulty urinating.  Musculoskeletal:  Positive for arthralgias. Negative for myalgias.  Skin: Negative.   Neurological: Negative.  Negative for dizziness and weakness.  Hematological:  Negative for adenopathy. Does not bruise/bleed easily.  Psychiatric/Behavioral: Negative.      Objective:  BP (!) 142/90 (BP Location: Left Arm, Patient Position: Sitting, Cuff Size: Normal)   Pulse 88   Temp 98.3 F (36.8 C) (Oral)   Resp 16   Ht 5\' 8"  (1.727 m)   Wt 212 lb 3.2 oz (96.3 kg)   SpO2 94%   BMI 32.26 kg/m   BP Readings from Last 3 Encounters:  09/18/23 (!) 142/90  05/22/23 138/86  02/19/23 (!) 154/90    Wt Readings from Last 3 Encounters:  09/18/23 212 lb 3.2 oz (96.3 kg)  05/22/23 212 lb 12.8 oz (96.5 kg)  02/19/23 220 lb (99.8 kg)    Physical Exam Vitals reviewed.  Constitutional:      Appearance: Normal appearance.  HENT:     Mouth/Throat:     Mouth: Mucous membranes are moist.  Eyes:  General: No scleral icterus.    Conjunctiva/sclera: Conjunctivae normal.  Cardiovascular:     Rate and Rhythm: Normal rate and regular rhythm.     Heart sounds: No murmur heard.    No friction rub. No gallop.     Comments: EKG---- NSR with SA, 78 bpm LAD Inferior infarct pattern Unchanged Pulmonary:     Effort: Pulmonary effort is normal.     Breath sounds: No stridor. No wheezing, rhonchi or rales.  Abdominal:     General: Abdomen is flat.     Palpations: There is no mass.     Tenderness: There is no abdominal tenderness. There is no guarding.     Hernia:  No hernia is present.  Musculoskeletal:        General: Normal range of motion.     Cervical back: Neck supple.     Right lower leg: No edema.     Left lower leg: No edema.  Lymphadenopathy:     Cervical: No cervical adenopathy.  Skin:    General: Skin is warm and dry.  Neurological:     General: No focal deficit present.     Mental Status: She is alert. Mental status is at baseline.  Psychiatric:        Mood and Affect: Mood normal.        Behavior: Behavior normal.     Lab Results  Component Value Date   WBC 4.8 09/18/2023   HGB 14.4 09/18/2023   HCT 43.0 09/18/2023   PLT 241.0 09/18/2023   GLUCOSE 100 (H) 09/18/2023   CHOL 203 (H) 02/19/2023   TRIG 134.0 02/19/2023   HDL 72.10 02/19/2023   LDLDIRECT 112.0 08/26/2019   LDLCALC 104 (H) 02/19/2023   ALT 18 09/18/2023   AST 23 09/18/2023   NA 140 09/18/2023   K 4.5 09/18/2023   CL 103 09/18/2023   CREATININE 0.98 09/18/2023   BUN 31 (H) 09/18/2023   CO2 30 09/18/2023   TSH 2.25 02/19/2023   INR 0.9 06/11/2018   HGBA1C 5.0 06/11/2018    MM 3D SCREENING MAMMOGRAM BILATERAL BREAST Result Date: 02/28/2023 CLINICAL DATA:  Screening. EXAM: DIGITAL SCREENING BILATERAL MAMMOGRAM WITH TOMOSYNTHESIS AND CAD TECHNIQUE: Bilateral screening digital craniocaudal and mediolateral oblique mammograms were obtained. Bilateral screening digital breast tomosynthesis was performed. The images were evaluated with computer-aided detection. COMPARISON:  Previous exam(s). ACR Breast Density Category a: The breasts are almost entirely fatty. FINDINGS: There are no findings suspicious for malignancy. IMPRESSION: No mammographic evidence of malignancy. A result letter of this screening mammogram will be mailed directly to the patient. RECOMMENDATION: Screening mammogram in one year. (Code:SM-B-01Y) BI-RADS CATEGORY  1: Negative. Electronically Signed   By: Amanda Jungling M.D.   On: 02/28/2023 15:55    Assessment & Plan:   Essential hypertension-  EKG is negative for LVH. She has not achieved her BP goal of 130/80. Will restart the antihypertensives. -     EKG 12-Lead -     CT CARDIAC SCORING ([facility] LOCATIONS ONLY); Future -     Basic metabolic panel with GFR; Future -     CBC with Differential/Platelet; Future -     Hepatic function panel; Future  Need for prophylactic vaccination and inoculation against varicella -     Shingrix ; Inject 0.5 mLs into the muscle once for 1 dose.  Dispense: 0.5 mL; Refill: 1  Need for prophylactic vaccination with combined diphtheria-tetanus-pertussis (DTP) vaccine -     Boostrix ; Inject 0.5 mLs into the  muscle once for 1 dose.  Dispense: 0.5 mL; Refill: 0  Estrogen deficiency -     DG Bone Density; Future  Hyperlipidemia LDL goal <130- LDL goal achieved. Doing well on the statin. Will risk stratify with a coronary calcium  score.  -     CT CARDIAC SCORING ([facility] LOCATIONS ONLY); Future -     Hepatic function panel; Future -     Simvastatin ; Take 1 tablet (20 mg total) by mouth daily at 6 PM.  Dispense: 90 tablet; Refill: 1     Follow-up: Return in about 6 months (around 03/20/2024).  Sandra Crouch, MD

## 2023-09-22 ENCOUNTER — Encounter: Payer: Self-pay | Admitting: Internal Medicine

## 2023-10-09 ENCOUNTER — Ambulatory Visit: Payer: Self-pay
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
## 2023-10-13 ENCOUNTER — Ambulatory Visit: Payer: Self-pay | Admitting: Family Medicine

## 2023-10-13 ENCOUNTER — Ambulatory Visit (INDEPENDENT_AMBULATORY_CARE_PROVIDER_SITE_OTHER): Admitting: Family Medicine

## 2023-10-13 ENCOUNTER — Ambulatory Visit (HOSPITAL_COMMUNITY)
Admission: RE | Admit: 2023-10-13 | Discharge: 2023-10-13 | Disposition: A | Discharge status: Home / Self Care | Source: Ambulatory Visit | Attending: Family Medicine | Admitting: Family Medicine

## 2023-10-13 ENCOUNTER — Encounter: Payer: Self-pay | Admitting: Family Medicine

## 2023-10-13 ENCOUNTER — Telehealth: Payer: Self-pay | Admitting: Internal Medicine

## 2023-10-13 VITALS — BP 115/74 | HR 66 | Temp 97.4°F | Resp 18 | Ht 68.0 in | Wt 218.0 lb

## 2023-10-13 DIAGNOSIS — M7989 Other specified soft tissue disorders: Secondary | ICD-10-CM

## 2023-10-13 DIAGNOSIS — L03116 Cellulitis of left lower limb: Secondary | ICD-10-CM | POA: Diagnosis not present

## 2023-10-13 MED ORDER — CEPHALEXIN 500 MG PO CAPS: 500.0000 mg | ORAL_CAPSULE | Freq: Two times a day (BID) | ORAL | 0 refills | Status: AC

## 2023-10-13 NOTE — Progress Notes (Signed)
 Assessment & Plan:  1. Cellulitis of left leg (Primary) Education provided on cellulitis.  - cephALEXin (KEFLEX) 500 MG capsule; Take 1 capsule (500 mg total) by mouth 2 (two) times daily for 7 days.  Dispense: 14 capsule; Refill: 0  2. Swelling of left lower extremity U/S to r/o DVT due to swelling and erythema of LLE.  - VAS US  LOWER EXTREMITY VENOUS (DVT); Future   Follow up plan: Return if symptoms worsen or fail to improve.  Hershel Los, MSN, APRN, FNP-C  Subjective:  HPI: Dawn LAFEVER is a 73 y.o. female presenting on 10/13/2023 for Leg Swelling (Pt fell and landed on left leg 16 days ago. Lower left leg is swollen and bruised- no pain)  Patient reports she fell and landed on her left leg 16 days ago. Bruising was significant and went down her to her foot. Swelling has remained. She has since developed a blood blister. No wound at time of fall. Denies any pain.    ROS: Negative unless specifically indicated above in HPI.   Relevant past medical history reviewed and updated as indicated.   Allergies and medications reviewed and updated.   Current Outpatient Medications:    indapamide  (LOZOL ) 1.25 MG tablet, Take 1 tablet (1.25 mg total) by mouth daily., Disp: 90 tablet, Rfl: 1   losartan  (COZAAR ) 100 MG tablet, Take 1 tablet (100 mg total) by mouth daily., Disp: 90 tablet, Rfl: 1   simvastatin  (ZOCOR ) 20 MG tablet, Take 1 tablet (20 mg total) by mouth daily at 6 PM., Disp: 90 tablet, Rfl: 1   aspirin  81 MG chewable tablet, Chew by mouth daily. (Patient not taking: Reported on 10/13/2023), Disp: , Rfl:    scopolamine  (TRANSDERM-SCOP) 1 MG/3DAYS, Place 1 patch (1.5 mg total) onto the skin every 3 (three) days. (Patient not taking: Reported on 10/13/2023), Disp: 10 patch, Rfl: 0  Allergies  Allergen Reactions   Amlodipine  Swelling   Iohexol  Swelling    Allergy is to [employer];  oysters caused swelling of anterior neck. Dr.Clinton Young could find no "markers" for  shellfish allergy///NO IV CONTRAST ALLERGY//A.[place]///   Shellfish Allergy Swelling    Objective:   BP 115/74   Pulse 66   Temp (!) 97.4 F (36.3 C)   Resp 18   Ht 5\' 8"  (1.727 m)   Wt 218 lb (98.9 kg)   SpO2 98%   BMI 33.15 kg/m    Physical Exam Vitals reviewed.  Constitutional:      General: She is not in acute distress.    Appearance: Normal appearance. She is not ill-appearing, toxic-appearing or diaphoretic.  HENT:     Head: Normocephalic and atraumatic.  Eyes:     General: No scleral icterus.       Right eye: No discharge.        Left eye: No discharge.     Conjunctiva/sclera: Conjunctivae normal.  Cardiovascular:     Rate and Rhythm: Normal rate.  Pulmonary:     Effort: Pulmonary effort is normal. No respiratory distress.  Musculoskeletal:        General: Normal range of motion.     Cervical back: Normal range of motion.     Left lower leg: Swelling and tenderness present.     Comments: Negative Homan's sign. Lateral aspect of LLE is erythematous and warm around blood blister. There is also a small scab just above the blood blister.   Skin:    General: Skin is warm and dry.  Capillary Refill: Capillary refill takes less than 2 seconds.  Neurological:     General: No focal deficit present.     Mental Status: She is alert and oriented to person, place, and time. Mental status is at baseline.  Psychiatric:        Mood and Affect: Mood normal.        Behavior: Behavior normal.        Thought Content: Thought content normal.        Judgment: Judgment normal.

## 2023-10-13 NOTE — Telephone Encounter (Signed)
 Copied from CRM [id number]. Topic: Clinical - Lab/Test Results >> Oct 13, 2023  3:27 PM Juleen Oakland F wrote: Reason for CRM: Amalia Badder called to let Theron Flavin know that the DVT results done today were negative. Her call back number is [phone].

## 2023-11-17 ENCOUNTER — Encounter: Payer: Self-pay | Admitting: Internal Medicine

## 2023-11-17 ENCOUNTER — Ambulatory Visit (INDEPENDENT_AMBULATORY_CARE_PROVIDER_SITE_OTHER): Admitting: Internal Medicine

## 2023-11-17 ENCOUNTER — Ambulatory Visit: Payer: Self-pay

## 2023-11-17 VITALS — BP 118/80 | HR 99 | Temp 97.8°F | Ht 68.0 in | Wt 212.4 lb

## 2023-11-17 DIAGNOSIS — L97822 Non-pressure chronic ulcer of other part of left lower leg with fat layer exposed: Secondary | ICD-10-CM | POA: Diagnosis not present

## 2023-11-17 DIAGNOSIS — R600 Localized edema: Secondary | ICD-10-CM

## 2023-11-17 DIAGNOSIS — R609 Edema, unspecified: Secondary | ICD-10-CM | POA: Insufficient documentation

## 2023-11-17 DIAGNOSIS — I872 Venous insufficiency (chronic) (peripheral): Secondary | ICD-10-CM | POA: Diagnosis not present

## 2023-11-17 DIAGNOSIS — I83009 Varicose veins of unspecified lower extremity with ulcer of unspecified site: Secondary | ICD-10-CM | POA: Insufficient documentation

## 2023-11-17 MED ORDER — FUROSEMIDE 20 MG PO TABS: 20.0000 mg | ORAL_TABLET | Freq: Every day | ORAL | 1 refills | Status: DC

## 2023-11-17 MED ORDER — MUPIROCIN 2 % EX OINT: TOPICAL_OINTMENT | CUTANEOUS | 0 refills | Status: DC

## 2023-11-17 MED ORDER — DOXYCYCLINE HYCLATE 100 MG PO TABS: 100.0000 mg | ORAL_TABLET | Freq: Two times a day (BID) | ORAL | 0 refills | Status: DC
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
## 2023-11-17 NOTE — Assessment & Plan Note (Addendum)
 See photo Wound was cleaned, dressed w/Bacroban, TELFA, Coban wrap Use Lasix  Elevate legs [facility] consult Start Doxy Rx if worse

## 2023-11-17 NOTE — Patient Instructions (Signed)
  Use Lasix  Elevate legs Start Doxy Rx if worse

## 2023-11-17 NOTE — Assessment & Plan Note (Addendum)
 L>R leg Furosemide  prn Use compr socks

## 2023-11-17 NOTE — Progress Notes (Signed)
 Subjective:  Patient ID: Dawn [name], female    DOB: 1951/02/14  Age: 73 y.o. MRN: [id number]  CC: Leg Injury (Pt has a wound on lower left leg since May 18th... Pt was tx'd for Cellulitis in June... Pt states wound has opened up and now has drainage, redness, swelling, very warm to the touch x1 day.)   HPI [name] presents for LLE injury in May 2025 - a fall on a boat and LLE injury. 1 wk ago a hole formed on the L lat distal aspect  Outpatient Medications Prior to Visit  Medication Sig Dispense Refill   indapamide  (LOZOL ) 1.25 MG tablet Take 1 tablet (1.25 mg total) by mouth daily. 90 tablet 1   losartan  (COZAAR ) 100 MG tablet Take 1 tablet (100 mg total) by mouth daily. 90 tablet 1   simvastatin  (ZOCOR ) 20 MG tablet Take 1 tablet (20 mg total) by mouth daily at 6 PM. 90 tablet 1   aspirin  81 MG chewable tablet Chew by mouth daily. (Patient not taking: Reported on 11/17/2023)     scopolamine  (TRANSDERM-SCOP) 1 MG/3DAYS Place 1 patch (1.5 mg total) onto the skin every 3 (three) days. (Patient not taking: Reported on 11/17/2023) 10 patch 0   No facility-administered medications prior to visit.    ROS: Review of Systems  Constitutional:  Negative for activity change, appetite change, chills, fatigue and unexpected weight change.  HENT:  Negative for congestion, mouth sores and sinus pressure.   Eyes:  Negative for visual disturbance.  Respiratory:  Negative for cough and chest tightness.   Cardiovascular:  Positive for leg swelling.  Gastrointestinal:  Negative for abdominal pain and nausea.  Genitourinary:  Negative for difficulty urinating, frequency and vaginal pain.  Musculoskeletal:  Negative for back pain and gait problem.  Skin:  Positive for color change and wound. Negative for pallor and rash.  Neurological:  Negative for dizziness, tremors, weakness, numbness and headaches.  Psychiatric/Behavioral:  Negative for confusion and sleep disturbance.      Objective:  BP 118/80   Pulse 99   Temp 97.8 F (36.6 C) (Oral)   Ht 5' 8 (1.727 m)   Wt 212 lb 6.4 oz (96.3 kg)   SpO2 97%   BMI 32.30 kg/m   BP Readings from Last 3 Encounters:  11/17/23 118/80  10/13/23 115/74  09/18/23 (!) 142/90    Wt Readings from Last 3 Encounters:  11/17/23 212 lb 6.4 oz (96.3 kg)  10/13/23 218 lb (98.9 kg)  09/18/23 212 lb 3.2 oz (96.3 kg)    Physical Exam Constitutional:      General: She is not in acute distress.    Appearance: She is well-developed. She is obese. She is not ill-appearing.  HENT:     Head: Normocephalic.     Right Ear: External ear normal.     Left Ear: External ear normal.     Nose: Nose normal.  Eyes:     General:        Right eye: No discharge.        Left eye: No discharge.     Conjunctiva/sclera: Conjunctivae normal.     Pupils: Pupils are equal, round, and reactive to light.  Neck:     Thyroid : No thyromegaly.     Vascular: No JVD.     Trachea: No tracheal deviation.  Cardiovascular:     Rate and Rhythm: Normal rate and regular rhythm.     Heart sounds: Normal heart sounds.  Pulmonary:     Effort: No respiratory distress.     Breath sounds: No stridor. No wheezing.  Abdominal:     General: Bowel sounds are normal. There is no distension.     Palpations: Abdomen is soft. There is no mass.     Tenderness: There is no abdominal tenderness. There is no guarding or rebound.  Musculoskeletal:        General: No tenderness.     Cervical back: Normal range of motion and neck supple. No rigidity.     Right lower leg: No edema.     Left lower leg: No edema.  Lymphadenopathy:     Cervical: No cervical adenopathy.  Skin:    Findings: Erythema and lesion present. No rash.  Neurological:     Cranial Nerves: No cranial nerve deficit.     Motor: No abnormal muscle tone.     Coordination: Coordination normal.     Deep Tendon Reflexes: Reflexes normal.  Psychiatric:        Behavior: Behavior normal.         Thought Content: Thought content normal.        Judgment: Judgment normal.   LE w/trace edema See photo Wound was cleaned, dressed w/Bacroban, TELFA, Coban wrap  Lab Results  Component Value Date   WBC 4.8 09/18/2023   HGB 14.4 09/18/2023   HCT 43.0 09/18/2023   PLT 241.0 09/18/2023   GLUCOSE 100 (H) 09/18/2023   CHOL 203 (H) 02/19/2023   TRIG 134.0 02/19/2023   HDL 72.10 02/19/2023   LDLDIRECT 112.0 08/26/2019   LDLCALC 104 (H) 02/19/2023   ALT 18 09/18/2023   AST 23 09/18/2023   NA 140 09/18/2023   K 4.5 09/18/2023   CL 103 09/18/2023   CREATININE 0.98 09/18/2023   BUN 31 (H) 09/18/2023   CO2 30 09/18/2023   TSH 2.25 02/19/2023   INR 0.9 06/11/2018   HGBA1C 5.0 06/11/2018    VAS US  LOWER EXTREMITY VENOUS (DVT) Result Date: 10/13/2023  Lower Venous DVT Study Patient Name:  Dawn [name]  Date of Exam:   10/13/2023 Medical Rec #: [id number]           Accession #:    [id number] Date of Birth: [birth year]           Patient Gender: F Patient Age:   20 years Exam Location:  [address] Procedure:      VAS US  LOWER EXTREMITY VENOUS (DVT) Referring Phys: NIKI RUNG --------------------------------------------------------------------------------  Indications: Edema, and Pain. Other Indications: Injury / fall on boat. Performing Technologist: Devere Dark RVT  Examination Guidelines: A complete evaluation includes B-mode imaging, spectral Doppler, color Doppler, and power Doppler as needed of all accessible portions of each vessel. Bilateral testing is considered an integral part of a complete examination. Limited examinations for reoccurring indications may be performed as noted. The reflux portion of the exam is performed with the patient in reverse Trendelenburg.  +-----+---------------+---------+-----------+----------+--------------+ RIGHTCompressibilityPhasicitySpontaneityPropertiesThrombus Aging +-----+---------------+---------+-----------+----------+--------------+ CFV   Full           Yes      Yes                                 +-----+---------------+---------+-----------+----------+--------------+   +---------+---------------+---------+-----------+----------+--------------+ LEFT     CompressibilityPhasicitySpontaneityPropertiesThrombus Aging +---------+---------------+---------+-----------+----------+--------------+ CFV      Full           Yes      Yes                                 +---------+---------------+---------+-----------+----------+--------------+  SFJ      Full           Yes      Yes                                 +---------+---------------+---------+-----------+----------+--------------+ FV Prox  Full           Yes      Yes                                 +---------+---------------+---------+-----------+----------+--------------+ FV Mid   Full           Yes      Yes                                 +---------+---------------+---------+-----------+----------+--------------+ FV DistalFull           Yes      Yes                                 +---------+---------------+---------+-----------+----------+--------------+ PFV      Full           Yes      Yes                                 +---------+---------------+---------+-----------+----------+--------------+ POP      Full           Yes      Yes                                 +---------+---------------+---------+-----------+----------+--------------+ PTV      Full           Yes      Yes                                 +---------+---------------+---------+-----------+----------+--------------+ PERO     Full           Yes      Yes                                 +---------+---------------+---------+-----------+----------+--------------+ Gastroc  Full           Yes      Yes                                 +---------+---------------+---------+-----------+----------+--------------+ GSV      Full           Yes      Yes                                  +---------+---------------+---------+-----------+----------+--------------+ SSV      Full           Yes      Yes                                 +---------+---------------+---------+-----------+----------+--------------+  Findings reported to [facility] at 15:30.  Summary: LEFT: - There is no evidence of deep vein thrombosis in the lower extremity. - There is no evidence of superficial venous thrombosis. - There is no evidence of deep vein thrombosis proximal to the inguinal ligament or in the common femoral vein.  *See table(s) above for measurements and observations. Electronically signed by Norman Serve on 10/13/2023 at 4:02:54 PM.    Final     Assessment & Plan:   Problem List Items Addressed This Visit     Chronic venous insufficiency   Relevant Medications   furosemide  (LASIX ) 20 MG tablet   Other Relevant Orders   Ambulatory referral to Wound Clinic   Venous stasis ulcer (HCC) - Primary   See photo Wound was cleaned, dressed w/Bacroban, TELFA, Coban wrap Use Lasix  Elevate legs Wound Care Center consult Start Doxy Rx if worse       Relevant Orders   Ambulatory referral to Wound Clinic   Edema   L>R leg Furosemide  prn Use compr socks         Meds ordered this encounter  Medications   furosemide  (LASIX ) 20 MG tablet    Sig: Take 1-2 tablets (20-40 mg total) by mouth daily.    Dispense:  60 tablet    Refill:  1   mupirocin  ointment (BACTROBAN ) 2 %    Sig: On leg wound w/dressing change qd or bid    Dispense:  30 g    Refill:  0   doxycycline  (VIBRA -TABS) 100 MG tablet    Sig: Take 1 tablet (100 mg total) by mouth 2 (two) times daily.    Dispense:  20 tablet    Refill:  0      Follow-up: Return in about 4 weeks (around 12/15/2023) for f/u with PCP.  Marolyn Noel, MD

## 2023-11-18 ENCOUNTER — Telehealth: Payer: Self-pay

## 2023-11-18 NOTE — Telephone Encounter (Signed)
 Copied from CRM [id number]. Topic: Clinical - Medical Advice >> Nov 18, 2023 12:03 PM Chasity T wrote: Reason for CRM: Patient is calling in for confirmation regarding her appointment yesterday on what leg she needs to wear the compression tights that were referred by Dr Garald. Please contact her back for further assistance.

## 2023-11-18 NOTE — Telephone Encounter (Signed)
 Copied from CRM [id number]. Topic: Appointments - Scheduling Inquiry for Clinic >> Nov 18, 2023 10:31 AM DeAngela L wrote: Reason for CRM: the referral from Dr. Garald on 11/17/23 for the patient to call the wound care at [facility] wound Care on [address] they do not have an opening till December 23, 2023 and patient states she will not be able to wait that long cause it's an open wound. The wound opened about 10 days ago and the patient is asking for the providers office to help her find a new location that could give her wound care soon as possible  Pt num [phone] (M)

## 2023-11-19 ENCOUNTER — Ambulatory Visit: Payer: Self-pay

## 2023-11-19 NOTE — Telephone Encounter (Signed)
 FYI Only or Action Required?: Action required by provider: referral request.  Patient was last seen in primary care on 11/17/2023 by Plotnikov, Dawn GAILS, MD.  Called Nurse Triage reporting Advice Only.  Symptoms began N/A.  Interventions attempted: Other: N/A.  Symptoms are: N/A.  Triage Disposition: Call PCP When Office is Open  Patient/caregiver understands and will follow disposition?: Yes                             Copied from CRM [id number]. Topic: Clinical - Red Word Triage >> Nov 19, 2023 11:04 AM Henretta I wrote: Red Word that prompted transfer to Nurse Triage: Patient has open wound left leg  1. REASON FOR CALL: What is the main reason for your call? or How can I best help you? Patient stated she has an open wound on her leg. Patient stated a referral was placed for wound care, but the location does not have an opening until mid August. Patient is requesting a referral to additional locations that would have sooner availability. Please advise.   Reason for Disposition  [1] Caller requesting NON-URGENT health information AND [2] PCP's office is the best resource  Protocols used: Information Only Call - No Triage-A-AH

## 2023-11-19 NOTE — Telephone Encounter (Signed)
 Copied from CRM [id number]. Topic: General - Other >> Nov 19, 2023 12:27 PM Aleatha C wrote: Reason for CRM: Patient office visit summary stated that she needed to contact  Dr Joshua and will let a call back following her appointment

## 2023-11-21 NOTE — Telephone Encounter (Signed)
 Patient has a sooner appointment with the wound clinic. She has been scheduled with Dr. Joshua and she has given a verbal understanding.

## 2023-11-21 NOTE — Telephone Encounter (Signed)
 I just sent you a call about this. This is more information from the patient.

## 2023-11-21 NOTE — Telephone Encounter (Signed)
 Please check with the [facility] wound clinic if they have a sooner appointment. She can be put on a cancellation list that was long wound clinic. Continue with treatment I prescribed. See Dr. Joshua in 10-14 days. Thank you

## 2023-11-21 NOTE — Telephone Encounter (Signed)
 This has been addressed in another phone call message.

## 2023-11-21 NOTE — Telephone Encounter (Signed)
 Dr. SHAUNNA please advise. Is there another wound center you could send her to?

## 2023-11-21 NOTE — Telephone Encounter (Signed)
 Patient has been scheduled

## 2023-11-25 ENCOUNTER — Encounter (HOSPITAL_BASED_OUTPATIENT_CLINIC_OR_DEPARTMENT_OTHER): Attending: General Surgery | Admitting: General Surgery

## 2023-11-25 DIAGNOSIS — L02416 Cutaneous abscess of left lower limb: Secondary | ICD-10-CM | POA: Diagnosis not present

## 2023-11-25 DIAGNOSIS — L97822 Non-pressure chronic ulcer of other part of left lower leg with fat layer exposed: Secondary | ICD-10-CM | POA: Insufficient documentation

## 2023-11-25 DIAGNOSIS — I872 Venous insufficiency (chronic) (peripheral): Secondary | ICD-10-CM | POA: Insufficient documentation

## 2023-12-01 ENCOUNTER — Encounter (HOSPITAL_BASED_OUTPATIENT_CLINIC_OR_DEPARTMENT_OTHER): Admitting: General Surgery

## 2023-12-01 DIAGNOSIS — L97822 Non-pressure chronic ulcer of other part of left lower leg with fat layer exposed: Secondary | ICD-10-CM | POA: Diagnosis not present

## 2023-12-01 DIAGNOSIS — I872 Venous insufficiency (chronic) (peripheral): Secondary | ICD-10-CM | POA: Diagnosis not present

## 2023-12-01 DIAGNOSIS — L02416 Cutaneous abscess of left lower limb: Secondary | ICD-10-CM | POA: Diagnosis not present

## 2023-12-02 ENCOUNTER — Ambulatory Visit (INDEPENDENT_AMBULATORY_CARE_PROVIDER_SITE_OTHER): Admitting: Internal Medicine

## 2023-12-02 ENCOUNTER — Encounter: Payer: Self-pay | Admitting: Internal Medicine

## 2023-12-02 VITALS — BP 142/88 | HR 68 | Temp 98.4°F | Resp 16 | Ht 68.0 in | Wt 210.2 lb

## 2023-12-02 DIAGNOSIS — I872 Venous insufficiency (chronic) (peripheral): Secondary | ICD-10-CM

## 2023-12-02 DIAGNOSIS — I10 Essential (primary) hypertension: Secondary | ICD-10-CM | POA: Diagnosis not present

## 2023-12-02 MED ORDER — VASCULERA PO TABS
1.0000 | ORAL_TABLET | Freq: Every day | ORAL | 11 refills | Status: AC
Start: 2023-12-02 — End: ?

## 2023-12-02 NOTE — Patient Instructions (Signed)
 Hypertension, Adult High blood pressure (hypertension) is when the force of blood pumping through the arteries is too strong. The arteries are the blood vessels that carry blood from the heart throughout the body. Hypertension forces the heart to work harder to pump blood and may cause arteries to become narrow or stiff. Untreated or uncontrolled hypertension can lead to a heart attack, heart failure, a stroke, kidney disease, and other problems. A blood pressure reading consists of a higher number over a lower number. Ideally, your blood pressure should be below 120/80. The first ("top") number is called the systolic pressure. It is a measure of the pressure in your arteries as your heart beats. The second ("bottom") number is called the diastolic pressure. It is a measure of the pressure in your arteries as the heart relaxes. What are the causes? The exact cause of this condition is not known. There are some conditions that result in high blood pressure. What increases the risk? Certain factors may make you more likely to develop high blood pressure. Some of these risk factors are under your control, including: Smoking. Not getting enough exercise or physical activity. Being overweight. Having too much fat, sugar, calories, or salt (sodium) in your diet. Drinking too much alcohol. Other risk factors include: Having a personal history of heart disease, diabetes, high cholesterol, or kidney disease. Stress. Having a family history of high blood pressure and high cholesterol. Having obstructive sleep apnea. Age. The risk increases with age. What are the signs or symptoms? High blood pressure may not cause symptoms. Very high blood pressure (hypertensive crisis) may cause: Headache. Fast or irregular heartbeats (palpitations). Shortness of breath. Nosebleed. Nausea and vomiting. Vision changes. Severe chest pain, dizziness, and seizures. How is this diagnosed? This condition is diagnosed by  measuring your blood pressure while you are seated, with your arm resting on a flat surface, your legs uncrossed, and your feet flat on the floor. The cuff of the blood pressure monitor will be placed directly against the skin of your upper arm at the level of your heart. Blood pressure should be measured at least twice using the same arm. Certain conditions can cause a difference in blood pressure between your right and left arms. If you have a high blood pressure reading during one visit or you have normal blood pressure with other risk factors, you may be asked to: Return on a different day to have your blood pressure checked again. Monitor your blood pressure at home for 1 week or longer. If you are diagnosed with hypertension, you may have other blood or imaging tests to help your health care provider understand your overall risk for other conditions. How is this treated? This condition is treated by making healthy lifestyle changes, such as eating healthy foods, exercising more, and reducing your alcohol intake. You may be referred for counseling on a healthy diet and physical activity. Your health care provider may prescribe medicine if lifestyle changes are not enough to get your blood pressure under control and if: Your systolic blood pressure is above 130. Your diastolic blood pressure is above 80. Your personal target blood pressure may vary depending on your medical conditions, your age, and other factors. Follow these instructions at home: Eating and drinking  Eat a diet that is high in fiber and potassium, and low in sodium, added sugar, and fat. An example of this eating plan is called the DASH diet. DASH stands for Dietary Approaches to Stop Hypertension. To eat this way: Eat  plenty of fresh fruits and vegetables. Try to fill one half of your plate at each meal with fruits and vegetables. Eat whole grains, such as whole-wheat pasta, brown rice, or whole-grain bread. Fill about one  fourth of your plate with whole grains. Eat or drink low-fat dairy products, such as skim milk or low-fat yogurt. Avoid fatty cuts of meat, processed or cured meats, and poultry with skin. Fill about one fourth of your plate with lean proteins, such as fish, chicken without skin, beans, eggs, or tofu. Avoid pre-made and processed foods. These tend to be higher in sodium, added sugar, and fat. Reduce your daily sodium intake. Many people with hypertension should eat less than 1,500 mg of sodium a day. Do not drink alcohol if: Your health care provider tells you not to drink. You are pregnant, may be pregnant, or are planning to become pregnant. If you drink alcohol: Limit how much you have to: 0-1 drink a day for women. 0-2 drinks a day for men. Know how much alcohol is in your drink. In the [place] one drink equals one 12 oz bottle of beer (355 mL), one 5 oz glass of wine (148 mL), or one 1 oz glass of hard liquor (44 mL). Lifestyle  Work with your health care provider to maintain a healthy body weight or to lose weight. Ask what an ideal weight is for you. Get at least 30 minutes of exercise that causes your heart to beat faster (aerobic exercise) most days of the week. Activities may include walking, swimming, or biking. Include exercise to strengthen your muscles (resistance exercise), such as Pilates or lifting weights, as part of your weekly exercise routine. Try to do these types of exercises for 30 minutes at least 3 days a week. Do not use any products that contain nicotine or tobacco. These products include cigarettes, chewing tobacco, and vaping devices, such as e-cigarettes. If you need help quitting, ask your health care provider. Monitor your blood pressure at home as told by your health care provider. Keep all follow-up visits. This is important. Medicines Take over-the-counter and prescription medicines only as told by your health care provider. Follow directions carefully. Blood  pressure medicines must be taken as prescribed. Do not skip doses of blood pressure medicine. Doing this puts you at risk for problems and can make the medicine less effective. Ask your health care provider about side effects or reactions to medicines that you should watch for. Contact a health care provider if you: Think you are having a reaction to a medicine you are taking. Have headaches that keep coming back (recurring). Feel dizzy. Have swelling in your ankles. Have trouble with your vision. Get help right away if you: Develop a severe headache or confusion. Have unusual weakness or numbness. Feel faint. Have severe pain in your chest or abdomen. Vomit repeatedly. Have trouble breathing. These symptoms may be an emergency. Get help right away. Call 911. Do not wait to see if the symptoms will go away. Do not drive yourself to the hospital. Summary Hypertension is when the force of blood pumping through your arteries is too strong. If this condition is not controlled, it may put you at risk for serious complications. Your personal target blood pressure may vary depending on your medical conditions, your age, and other factors. For most people, a normal blood pressure is less than 120/80. Hypertension is treated with lifestyle changes, medicines, or a combination of both. Lifestyle changes include losing weight, eating a healthy,  low-sodium diet, exercising more, and limiting alcohol. This information is not intended to replace advice given to you by your health care provider. Make sure you discuss any questions you have with your health care provider. Document Revised: 03/06/2021 Document Reviewed: 03/06/2021 Elsevier Patient Education  2024 [employer].

## 2023-12-02 NOTE — Progress Notes (Signed)
 Subjective:  Patient ID: Dawn [name], female    DOB: May 21, 1950  Age: 73 y.o. MRN: [id number]  CC: Hypertension   HPI ROSALEAH PERSON presents for f/up -----  Discussed the use of AI scribe software for clinical note transcription with the patient, who gave verbal consent to proceed.  History of Present Illness Dawn [name] is a 73 year old female who presents for follow-up care related to her knee replacement and wound management.  She is currently managing a wound that is dressed and in a cast. She visits the wound center once a week for follow-up care.  She experiences headaches and had a recent episode of lightheadedness while in the car, which she managed with Tylenol  and rest. No chest pain or shortness of breath. Her current medications include simvastatin  and losartan , which she is taking as prescribed. Additionally, she is on doxycycline  hyclate 100 mg twice a day, prescribed to prevent infection post-knee replacement surgery.      Outpatient Medications Prior to Visit  Medication Sig Dispense Refill   aspirin  81 MG chewable tablet Chew by mouth daily.     doxycycline  (VIBRA -TABS) 100 MG tablet Take 1 tablet (100 mg total) by mouth 2 (two) times daily. 20 tablet 0   indapamide  (LOZOL ) 1.25 MG tablet Take 1 tablet (1.25 mg total) by mouth daily. 90 tablet 1   losartan  (COZAAR ) 100 MG tablet Take 1 tablet (100 mg total) by mouth daily. 90 tablet 1   simvastatin  (ZOCOR ) 20 MG tablet Take 1 tablet (20 mg total) by mouth daily at 6 PM. 90 tablet 1   furosemide  (LASIX ) 20 MG tablet Take 1-2 tablets (20-40 mg total) by mouth daily. (Patient not taking: Reported on 12/02/2023) 60 tablet 1   mupirocin  ointment (BACTROBAN ) 2 % On leg wound w/dressing change qd or bid 30 g 0   scopolamine  (TRANSDERM-SCOP) 1 MG/3DAYS Place 1 patch (1.5 mg total) onto the skin every 3 (three) days. (Patient not taking: Reported on 11/17/2023) 10 patch 0   No facility-administered  medications prior to visit.    ROS Review of Systems  Constitutional:  Negative for chills, diaphoresis and fatigue.  HENT: Negative.    Eyes: Negative.   Respiratory:  Negative for cough, chest tightness, shortness of breath and wheezing.   Cardiovascular:  Negative for chest pain, palpitations and leg swelling.  Gastrointestinal: Negative.  Negative for abdominal pain, constipation, diarrhea, nausea and vomiting.  Endocrine: Negative.   Genitourinary: Negative.  Negative for difficulty urinating.  Musculoskeletal: Negative.  Negative for arthralgias and myalgias.  Neurological:  Negative for dizziness and weakness.  Hematological:  Negative for adenopathy. Does not bruise/bleed easily.  Psychiatric/Behavioral: Negative.      Objective:  BP (!) 142/88 (BP Location: Left Arm, Patient Position: Sitting, Cuff Size: Normal)   Pulse 68   Temp 98.4 F (36.9 C) (Oral)   Resp 16   Ht 5' 8 (1.727 m)   Wt 210 lb 3.2 oz (95.3 kg)   SpO2 97%   BMI 31.96 kg/m   BP Readings from Last 3 Encounters:  12/02/23 (!) 142/88  11/17/23 118/80  10/13/23 115/74    Wt Readings from Last 3 Encounters:  12/02/23 210 lb 3.2 oz (95.3 kg)  11/17/23 212 lb 6.4 oz (96.3 kg)  10/13/23 218 lb (98.9 kg)    Physical Exam Vitals reviewed.  Eyes:     General: No scleral icterus.    Conjunctiva/sclera: Conjunctivae normal.  Cardiovascular:  Rate and Rhythm: Normal rate and regular rhythm.     Heart sounds: No murmur heard.    No friction rub. No gallop.  Pulmonary:     Effort: Pulmonary effort is normal.     Breath sounds: No stridor. No wheezing, rhonchi or rales.  Abdominal:     General: Abdomen is flat.     Palpations: There is no mass.     Tenderness: There is no abdominal tenderness. There is no guarding.     Hernia: No hernia is present.  Musculoskeletal:        General: Normal range of motion.     Right lower leg: No edema.     Left lower leg: No edema.  Lymphadenopathy:      Cervical: No cervical adenopathy.  Skin:    General: Skin is warm.  Neurological:     General: No focal deficit present.     Mental Status: She is alert.  Psychiatric:        Mood and Affect: Mood normal.     Lab Results  Component Value Date   WBC 4.8 09/18/2023   HGB 14.4 09/18/2023   HCT 43.0 09/18/2023   PLT 241.0 09/18/2023   GLUCOSE 100 (H) 09/18/2023   CHOL 203 (H) 02/19/2023   TRIG 134.0 02/19/2023   HDL 72.10 02/19/2023   LDLDIRECT 112.0 08/26/2019   LDLCALC 104 (H) 02/19/2023   ALT 18 09/18/2023   AST 23 09/18/2023   NA 140 09/18/2023   K 4.5 09/18/2023   CL 103 09/18/2023   CREATININE 0.98 09/18/2023   BUN 31 (H) 09/18/2023   CO2 30 09/18/2023   TSH 2.25 02/19/2023   INR 0.9 06/11/2018   HGBA1C 5.0 06/11/2018    VAS US  LOWER EXTREMITY VENOUS (DVT) Result Date: 10/13/2023  Lower Venous DVT Study Patient Name:  Dawn [name]  Date of Exam:   10/13/2023 Medical Rec #: [id number]           Accession #:    [id number] Date of Birth: 01-27-1951           Patient Gender: F Patient Age:   27 years Exam Location:  [address] Procedure:      VAS US  LOWER EXTREMITY VENOUS (DVT) Referring Phys: NIKI RUNG --------------------------------------------------------------------------------  Indications: Edema, and Pain. Other Indications: Injury / fall on boat. Performing Technologist: Devere Dark RVT  Examination Guidelines: A complete evaluation includes B-mode imaging, spectral Doppler, color Doppler, and power Doppler as needed of all accessible portions of each vessel. Bilateral testing is considered an integral part of a complete examination. Limited examinations for reoccurring indications may be performed as noted. The reflux portion of the exam is performed with the patient in reverse Trendelenburg.  +-----+---------------+---------+-----------+----------+--------------+ RIGHTCompressibilityPhasicitySpontaneityPropertiesThrombus Aging  +-----+---------------+---------+-----------+----------+--------------+ CFV  Full           Yes      Yes                                 +-----+---------------+---------+-----------+----------+--------------+   +---------+---------------+---------+-----------+----------+--------------+ LEFT     CompressibilityPhasicitySpontaneityPropertiesThrombus Aging +---------+---------------+---------+-----------+----------+--------------+ CFV      Full           Yes      Yes                                 +---------+---------------+---------+-----------+----------+--------------+ SFJ  Full           Yes      Yes                                 +---------+---------------+---------+-----------+----------+--------------+ FV Prox  Full           Yes      Yes                                 +---------+---------------+---------+-----------+----------+--------------+ FV Mid   Full           Yes      Yes                                 +---------+---------------+---------+-----------+----------+--------------+ FV DistalFull           Yes      Yes                                 +---------+---------------+---------+-----------+----------+--------------+ PFV      Full           Yes      Yes                                 +---------+---------------+---------+-----------+----------+--------------+ POP      Full           Yes      Yes                                 +---------+---------------+---------+-----------+----------+--------------+ PTV      Full           Yes      Yes                                 +---------+---------------+---------+-----------+----------+--------------+ PERO     Full           Yes      Yes                                 +---------+---------------+---------+-----------+----------+--------------+ Gastroc  Full           Yes      Yes                                  +---------+---------------+---------+-----------+----------+--------------+ GSV      Full           Yes      Yes                                 +---------+---------------+---------+-----------+----------+--------------+ SSV      Full           Yes      Yes                                 +---------+---------------+---------+-----------+----------+--------------+  Findings reported to [facility] at 15:30.  Summary: LEFT: - There is no evidence of deep vein thrombosis in the lower extremity. - There is no evidence of superficial venous thrombosis. - There is no evidence of deep vein thrombosis proximal to the inguinal ligament or in the common femoral vein.  *See table(s) above for measurements and observations. Electronically signed by Norman Serve on 10/13/2023 at 4:02:54 PM.    Final     Assessment & Plan:   Essential hypertension- Her BP is adequately well controlled.  Chronic venous insufficiency -     Vasculera; Take 1 capsule by mouth daily.  Dispense: 30 tablet; Refill: 11     Follow-up: Return in about 3 months (around 03/03/2024).  Debby Molt, MD

## 2023-12-03 ENCOUNTER — Telehealth: Payer: Self-pay | Admitting: Internal Medicine

## 2023-12-03 NOTE — Telephone Encounter (Signed)
 Copied from CRM [id number]. Topic: Clinical - Medication Question >> Dec 03, 2023  9:46 AM Dawn [name] wrote: Reason for CRM: Patient called in stating she received prescription from [facility]) and wanted to be sure that Dr. Joshua did send it in before taking advised yes as of 7/22. Also stated it's showing $60 for prescription and would like to speak with Dr, Joshua nurse regarding ways to save if possible. Please call [phone]

## 2023-12-04 NOTE — Telephone Encounter (Signed)
 ok

## 2023-12-04 NOTE — Telephone Encounter (Signed)
 Patient is concerned about this medication because a side effect states that it can cause blood clotting. She wants to discuss this medication with her wound doctor before starting this.

## 2023-12-08 ENCOUNTER — Encounter (HOSPITAL_BASED_OUTPATIENT_CLINIC_OR_DEPARTMENT_OTHER): Admitting: General Surgery

## 2023-12-08 DIAGNOSIS — L97822 Non-pressure chronic ulcer of other part of left lower leg with fat layer exposed: Secondary | ICD-10-CM | POA: Diagnosis not present

## 2023-12-08 DIAGNOSIS — L02416 Cutaneous abscess of left lower limb: Secondary | ICD-10-CM | POA: Diagnosis not present

## 2023-12-08 DIAGNOSIS — I872 Venous insufficiency (chronic) (peripheral): Secondary | ICD-10-CM | POA: Diagnosis not present

## 2023-12-15 ENCOUNTER — Encounter: Payer: Self-pay | Admitting: Internal Medicine

## 2023-12-15 ENCOUNTER — Ambulatory Visit: Admitting: Internal Medicine

## 2023-12-15 ENCOUNTER — Encounter (HOSPITAL_BASED_OUTPATIENT_CLINIC_OR_DEPARTMENT_OTHER): Attending: General Surgery | Admitting: General Surgery

## 2023-12-15 VITALS — BP 130/76 | HR 88 | Temp 98.3°F | Ht 68.0 in | Wt 215.2 lb

## 2023-12-15 DIAGNOSIS — L97822 Non-pressure chronic ulcer of other part of left lower leg with fat layer exposed: Secondary | ICD-10-CM | POA: Insufficient documentation

## 2023-12-15 DIAGNOSIS — L02416 Cutaneous abscess of left lower limb: Secondary | ICD-10-CM | POA: Diagnosis not present

## 2023-12-15 DIAGNOSIS — I872 Venous insufficiency (chronic) (peripheral): Secondary | ICD-10-CM | POA: Diagnosis not present

## 2023-12-15 DIAGNOSIS — I10 Essential (primary) hypertension: Secondary | ICD-10-CM

## 2023-12-15 NOTE — Progress Notes (Signed)
 Subjective:  Patient ID: Dawn [name], female    DOB: 11-04-1950  Age: 73 y.o. MRN: [id number]  CC: Medical Management of Chronic Issues (4 week follow up )   HPI Dawn [name] presents for error  Outpatient Medications Prior to Visit  Medication Sig Dispense Refill   aspirin  81 MG chewable tablet Chew by mouth daily.     Dietary Management Product (VASCULERA) TABS Take 1 capsule by mouth daily. 30 tablet 11   doxycycline  (VIBRA -TABS) 100 MG tablet Take 1 tablet (100 mg total) by mouth 2 (two) times daily. 20 tablet 0   indapamide  (LOZOL ) 1.25 MG tablet Take 1 tablet (1.25 mg total) by mouth daily. 90 tablet 1   losartan  (COZAAR ) 100 MG tablet Take 1 tablet (100 mg total) by mouth daily. 90 tablet 1   simvastatin  (ZOCOR ) 20 MG tablet Take 1 tablet (20 mg total) by mouth daily at 6 PM. 90 tablet 1   furosemide  (LASIX ) 20 MG tablet Take 1-2 tablets (20-40 mg total) by mouth daily. (Patient not taking: Reported on 12/02/2023) 60 tablet 1   No facility-administered medications prior to visit.    ROS Review of Systems  Objective:  BP 130/76 (BP Location: Left Arm, Patient Position: Sitting, Cuff Size: Normal)   Pulse 88   Temp 98.3 F (36.8 C) (Oral)   Ht 5' 8 (1.727 m)   Wt 215 lb 3.2 oz (97.6 kg)   SpO2 95%   BMI 32.72 kg/m   BP Readings from Last 3 Encounters:  12/15/23 130/76  12/02/23 (!) 142/88  11/17/23 118/80    Wt Readings from Last 3 Encounters:  12/15/23 215 lb 3.2 oz (97.6 kg)  12/02/23 210 lb 3.2 oz (95.3 kg)  11/17/23 212 lb 6.4 oz (96.3 kg)    Physical Exam  Lab Results  Component Value Date   WBC 4.8 09/18/2023   HGB 14.4 09/18/2023   HCT 43.0 09/18/2023   PLT 241.0 09/18/2023   GLUCOSE 100 (H) 09/18/2023   CHOL 203 (H) 02/19/2023   TRIG 134.0 02/19/2023   HDL 72.10 02/19/2023   LDLDIRECT 112.0 08/26/2019   LDLCALC 104 (H) 02/19/2023   ALT 18 09/18/2023   AST 23 09/18/2023   NA 140 09/18/2023   K 4.5 09/18/2023   CL 103  09/18/2023   CREATININE 0.98 09/18/2023   BUN 31 (H) 09/18/2023   CO2 30 09/18/2023   TSH 2.25 02/19/2023   INR 0.9 06/11/2018   HGBA1C 5.0 06/11/2018    VAS US  LOWER EXTREMITY VENOUS (DVT) Result Date: 10/13/2023  Lower Venous DVT Study Patient Name:  Dawn [name]  Date of Exam:   10/13/2023 Medical Rec #: [id number]           Accession #:    [id number] Date of Birth: [birth year]           Patient Gender: F Patient Age:   35 years Exam Location:  [address] Procedure:      VAS US  LOWER EXTREMITY VENOUS (DVT) Referring Phys: NIKI RUNG --------------------------------------------------------------------------------  Indications: Edema, and Pain. Other Indications: Injury / fall on boat. Performing Technologist: Devere Dark RVT  Examination Guidelines: A complete evaluation includes B-mode imaging, spectral Doppler, color Doppler, and power Doppler as needed of all accessible portions of each vessel. Bilateral testing is considered an integral part of a complete examination. Limited examinations for reoccurring indications may be performed as noted. The reflux portion of the exam is performed with the patient in reverse Trendelenburg.  +-----+---------------+---------+-----------+----------+--------------+  RIGHTCompressibilityPhasicitySpontaneityPropertiesThrombus Aging +-----+---------------+---------+-----------+----------+--------------+ CFV  Full           Yes      Yes                                 +-----+---------------+---------+-----------+----------+--------------+   +---------+---------------+---------+-----------+----------+--------------+ LEFT     CompressibilityPhasicitySpontaneityPropertiesThrombus Aging +---------+---------------+---------+-----------+----------+--------------+ CFV      Full           Yes      Yes                                 +---------+---------------+---------+-----------+----------+--------------+ SFJ      Full           Yes       Yes                                 +---------+---------------+---------+-----------+----------+--------------+ FV Prox  Full           Yes      Yes                                 +---------+---------------+---------+-----------+----------+--------------+ FV Mid   Full           Yes      Yes                                 +---------+---------------+---------+-----------+----------+--------------+ FV DistalFull           Yes      Yes                                 +---------+---------------+---------+-----------+----------+--------------+ PFV      Full           Yes      Yes                                 +---------+---------------+---------+-----------+----------+--------------+ POP      Full           Yes      Yes                                 +---------+---------------+---------+-----------+----------+--------------+ PTV      Full           Yes      Yes                                 +---------+---------------+---------+-----------+----------+--------------+ PERO     Full           Yes      Yes                                 +---------+---------------+---------+-----------+----------+--------------+ Gastroc  Full           Yes      Yes                                 +---------+---------------+---------+-----------+----------+--------------+  GSV      Full           Yes      Yes                                 +---------+---------------+---------+-----------+----------+--------------+ SSV      Full           Yes      Yes                                 +---------+---------------+---------+-----------+----------+--------------+   Findings reported to [facility] at 15:30.  Summary: LEFT: - There is no evidence of deep vein thrombosis in the lower extremity. - There is no evidence of superficial venous thrombosis. - There is no evidence of deep vein thrombosis proximal to the inguinal ligament or in the common femoral vein.  *See table(s)  above for measurements and observations. Electronically signed by Norman Serve on 10/13/2023 at 4:02:54 PM.    Final     Assessment & Plan:  There are no diagnoses linked to this encounter.   Follow-up: No follow-ups on file.  Debby Molt, MD

## 2023-12-22 ENCOUNTER — Encounter (HOSPITAL_BASED_OUTPATIENT_CLINIC_OR_DEPARTMENT_OTHER): Admitting: General Surgery

## 2023-12-22 DIAGNOSIS — L02416 Cutaneous abscess of left lower limb: Secondary | ICD-10-CM | POA: Diagnosis not present

## 2023-12-22 DIAGNOSIS — L97822 Non-pressure chronic ulcer of other part of left lower leg with fat layer exposed: Secondary | ICD-10-CM | POA: Diagnosis not present

## 2023-12-22 DIAGNOSIS — I872 Venous insufficiency (chronic) (peripheral): Secondary | ICD-10-CM | POA: Diagnosis not present

## 2023-12-24 ENCOUNTER — Encounter (HOSPITAL_BASED_OUTPATIENT_CLINIC_OR_DEPARTMENT_OTHER): Admitting: General Surgery

## 2023-12-24 DIAGNOSIS — L97822 Non-pressure chronic ulcer of other part of left lower leg with fat layer exposed: Secondary | ICD-10-CM | POA: Diagnosis not present

## 2023-12-24 DIAGNOSIS — I872 Venous insufficiency (chronic) (peripheral): Secondary | ICD-10-CM | POA: Diagnosis not present

## 2023-12-29 ENCOUNTER — Encounter (HOSPITAL_BASED_OUTPATIENT_CLINIC_OR_DEPARTMENT_OTHER): Admitting: General Surgery

## 2023-12-29 DIAGNOSIS — I872 Venous insufficiency (chronic) (peripheral): Secondary | ICD-10-CM | POA: Diagnosis not present

## 2023-12-29 DIAGNOSIS — L97822 Non-pressure chronic ulcer of other part of left lower leg with fat layer exposed: Secondary | ICD-10-CM | POA: Diagnosis not present

## 2023-12-29 DIAGNOSIS — L02416 Cutaneous abscess of left lower limb: Secondary | ICD-10-CM | POA: Diagnosis not present

## 2024-01-05 ENCOUNTER — Encounter (HOSPITAL_BASED_OUTPATIENT_CLINIC_OR_DEPARTMENT_OTHER): Admitting: General Surgery

## 2024-01-05 DIAGNOSIS — L02416 Cutaneous abscess of left lower limb: Secondary | ICD-10-CM | POA: Diagnosis not present

## 2024-01-05 DIAGNOSIS — L97822 Non-pressure chronic ulcer of other part of left lower leg with fat layer exposed: Secondary | ICD-10-CM | POA: Diagnosis not present

## 2024-01-05 DIAGNOSIS — I872 Venous insufficiency (chronic) (peripheral): Secondary | ICD-10-CM | POA: Diagnosis not present

## 2024-01-13 ENCOUNTER — Encounter (HOSPITAL_BASED_OUTPATIENT_CLINIC_OR_DEPARTMENT_OTHER): Attending: General Surgery | Admitting: General Surgery

## 2024-01-13 ENCOUNTER — Other Ambulatory Visit: Payer: Self-pay | Admitting: Internal Medicine

## 2024-01-13 DIAGNOSIS — L97822 Non-pressure chronic ulcer of other part of left lower leg with fat layer exposed: Secondary | ICD-10-CM | POA: Diagnosis not present

## 2024-01-13 DIAGNOSIS — I872 Venous insufficiency (chronic) (peripheral): Secondary | ICD-10-CM | POA: Insufficient documentation

## 2024-01-13 DIAGNOSIS — L02416 Cutaneous abscess of left lower limb: Secondary | ICD-10-CM | POA: Diagnosis not present

## 2024-01-19 ENCOUNTER — Encounter (HOSPITAL_BASED_OUTPATIENT_CLINIC_OR_DEPARTMENT_OTHER): Admitting: General Surgery

## 2024-01-19 DIAGNOSIS — I872 Venous insufficiency (chronic) (peripheral): Secondary | ICD-10-CM | POA: Diagnosis not present

## 2024-01-19 DIAGNOSIS — L97822 Non-pressure chronic ulcer of other part of left lower leg with fat layer exposed: Secondary | ICD-10-CM | POA: Diagnosis not present

## 2024-01-19 DIAGNOSIS — L02416 Cutaneous abscess of left lower limb: Secondary | ICD-10-CM | POA: Diagnosis not present

## 2024-01-26 ENCOUNTER — Encounter (HOSPITAL_BASED_OUTPATIENT_CLINIC_OR_DEPARTMENT_OTHER): Admitting: General Surgery

## 2024-01-26 DIAGNOSIS — L97822 Non-pressure chronic ulcer of other part of left lower leg with fat layer exposed: Secondary | ICD-10-CM | POA: Diagnosis not present

## 2024-01-26 DIAGNOSIS — L02416 Cutaneous abscess of left lower limb: Secondary | ICD-10-CM | POA: Diagnosis not present

## 2024-01-26 DIAGNOSIS — I872 Venous insufficiency (chronic) (peripheral): Secondary | ICD-10-CM | POA: Diagnosis not present

## 2024-02-02 ENCOUNTER — Encounter (HOSPITAL_BASED_OUTPATIENT_CLINIC_OR_DEPARTMENT_OTHER): Admitting: General Surgery

## 2024-02-02 DIAGNOSIS — L02416 Cutaneous abscess of left lower limb: Secondary | ICD-10-CM | POA: Diagnosis not present

## 2024-02-02 DIAGNOSIS — L97822 Non-pressure chronic ulcer of other part of left lower leg with fat layer exposed: Secondary | ICD-10-CM | POA: Diagnosis not present

## 2024-02-02 DIAGNOSIS — I872 Venous insufficiency (chronic) (peripheral): Secondary | ICD-10-CM | POA: Diagnosis not present

## 2024-02-09 ENCOUNTER — Encounter (HOSPITAL_BASED_OUTPATIENT_CLINIC_OR_DEPARTMENT_OTHER): Admitting: General Surgery

## 2024-02-09 DIAGNOSIS — I872 Venous insufficiency (chronic) (peripheral): Secondary | ICD-10-CM | POA: Diagnosis not present

## 2024-02-09 DIAGNOSIS — L02416 Cutaneous abscess of left lower limb: Secondary | ICD-10-CM | POA: Diagnosis not present

## 2024-02-09 DIAGNOSIS — L97822 Non-pressure chronic ulcer of other part of left lower leg with fat layer exposed: Secondary | ICD-10-CM | POA: Diagnosis not present

## 2024-02-16 ENCOUNTER — Encounter (HOSPITAL_BASED_OUTPATIENT_CLINIC_OR_DEPARTMENT_OTHER): Attending: General Surgery | Admitting: General Surgery

## 2024-02-16 DIAGNOSIS — L97822 Non-pressure chronic ulcer of other part of left lower leg with fat layer exposed: Secondary | ICD-10-CM | POA: Diagnosis not present

## 2024-02-16 DIAGNOSIS — I872 Venous insufficiency (chronic) (peripheral): Secondary | ICD-10-CM | POA: Diagnosis not present

## 2024-02-16 DIAGNOSIS — L02416 Cutaneous abscess of left lower limb: Secondary | ICD-10-CM | POA: Diagnosis not present

## 2024-02-20 DIAGNOSIS — H04123 Dry eye syndrome of bilateral lacrimal glands: Secondary | ICD-10-CM | POA: Diagnosis not present

## 2024-02-20 DIAGNOSIS — H2512 Age-related nuclear cataract, left eye: Secondary | ICD-10-CM | POA: Diagnosis not present

## 2024-02-20 DIAGNOSIS — H532 Diplopia: Secondary | ICD-10-CM | POA: Diagnosis not present

## 2024-02-23 ENCOUNTER — Encounter (HOSPITAL_BASED_OUTPATIENT_CLINIC_OR_DEPARTMENT_OTHER): Admitting: General Surgery

## 2024-02-23 DIAGNOSIS — L02416 Cutaneous abscess of left lower limb: Secondary | ICD-10-CM | POA: Diagnosis not present

## 2024-02-23 DIAGNOSIS — I872 Venous insufficiency (chronic) (peripheral): Secondary | ICD-10-CM | POA: Diagnosis not present

## 2024-02-23 DIAGNOSIS — L97822 Non-pressure chronic ulcer of other part of left lower leg with fat layer exposed: Secondary | ICD-10-CM | POA: Diagnosis not present

## 2024-03-01 ENCOUNTER — Ambulatory Visit (HOSPITAL_BASED_OUTPATIENT_CLINIC_OR_DEPARTMENT_OTHER): Admitting: General Surgery

## 2024-03-08 ENCOUNTER — Encounter (HOSPITAL_BASED_OUTPATIENT_CLINIC_OR_DEPARTMENT_OTHER): Admitting: General Surgery

## 2024-03-08 DIAGNOSIS — L97822 Non-pressure chronic ulcer of other part of left lower leg with fat layer exposed: Secondary | ICD-10-CM | POA: Diagnosis not present

## 2024-03-08 DIAGNOSIS — I872 Venous insufficiency (chronic) (peripheral): Secondary | ICD-10-CM | POA: Diagnosis not present

## 2024-03-09 ENCOUNTER — Other Ambulatory Visit: Payer: Self-pay | Admitting: Internal Medicine

## 2024-03-09 DIAGNOSIS — Z1231 Encounter for screening mammogram for malignant neoplasm of breast: Secondary | ICD-10-CM

## 2024-04-01 ENCOUNTER — Ambulatory Visit
Admission: RE | Admit: 2024-04-01 | Discharge: 2024-04-01 | Disposition: A | Discharge status: Home / Self Care | Source: Ambulatory Visit | Attending: Internal Medicine | Admitting: Internal Medicine

## 2024-04-01 DIAGNOSIS — Z1231 Encounter for screening mammogram for malignant neoplasm of breast: Secondary | ICD-10-CM

## 2024-04-27 DIAGNOSIS — L918 Other hypertrophic disorders of the skin: Secondary | ICD-10-CM | POA: Diagnosis not present

## 2024-04-27 DIAGNOSIS — Z85828 Personal history of other malignant neoplasm of skin: Secondary | ICD-10-CM | POA: Diagnosis not present

## 2024-04-27 DIAGNOSIS — L570 Actinic keratosis: Secondary | ICD-10-CM | POA: Diagnosis not present

## 2024-04-27 DIAGNOSIS — L821 Other seborrheic keratosis: Secondary | ICD-10-CM | POA: Diagnosis not present

## 2024-04-27 DIAGNOSIS — L723 Sebaceous cyst: Secondary | ICD-10-CM | POA: Diagnosis not present

## 2024-04-27 DIAGNOSIS — L718 Other rosacea: Secondary | ICD-10-CM | POA: Diagnosis not present

## 2024-04-27 DIAGNOSIS — L820 Inflamed seborrheic keratosis: Secondary | ICD-10-CM | POA: Diagnosis not present

## 2024-05-08 ENCOUNTER — Other Ambulatory Visit: Payer: Self-pay | Admitting: Internal Medicine

## 2024-05-08 DIAGNOSIS — E785 Hyperlipidemia, unspecified: Secondary | ICD-10-CM

## 2024-05-11 ENCOUNTER — Telehealth: Payer: Self-pay

## 2024-05-11 NOTE — Telephone Encounter (Signed)
 Please advise

## 2024-05-11 NOTE — Telephone Encounter (Signed)
 Copied from CRM #[id number]. Topic: Clinical - Medication Question >> May 11, 2024 10:16 AM Jeoffrey H wrote: Reason for CRM: Patient c/o headache, running nose with no fever. Stated tested positive for COVID via at home test. Stated they were not that sick but wants med called in for symps. Stated feels like a cold.   [id number]

## 2024-05-12 ENCOUNTER — Other Ambulatory Visit: Payer: Self-pay | Admitting: Internal Medicine

## 2024-05-12 DIAGNOSIS — J208 Acute bronchitis due to other specified organisms: Secondary | ICD-10-CM

## 2024-05-12 MED ORDER — PROMETHAZINE-DM 6.25-15 MG/5ML PO SYRP: 5.0000 mL | ORAL_SOLUTION | Freq: Four times a day (QID) | ORAL | 0 refills | Status: AC | PRN

## 2024-05-12 MED ORDER — NIRMATRELVIR/RITONAVIR (PAXLOVID) TABLET (RENAL DOSING): 2.0000 | ORAL_TABLET | Freq: Two times a day (BID) | ORAL | 0 refills | Status: AC

## 2024-05-12 NOTE — Telephone Encounter (Signed)
 RX's sent Hold simvastatin  for the next 7 days

## 2024-05-12 NOTE — Telephone Encounter (Signed)
 Unable to reach patient left a very detailed message about Dr. Joshua comments

## 2024-05-13 ENCOUNTER — Other Ambulatory Visit: Payer: Self-pay | Admitting: Internal Medicine

## 2024-05-13 DIAGNOSIS — I10 Essential (primary) hypertension: Secondary | ICD-10-CM

## 2024-05-14 NOTE — Telephone Encounter (Signed)
 Patient has been made aware and gave a verbal understanding.

## 2024-06-09 ENCOUNTER — Other Ambulatory Visit: Payer: Self-pay | Admitting: Internal Medicine

## 2024-06-09 DIAGNOSIS — I10 Essential (primary) hypertension: Secondary | ICD-10-CM
